# Patient Record
Sex: Female | Born: 1960 | Race: Black or African American | Hispanic: No | Smoking: Never smoker
Health system: Southern US, Community
[De-identification: ages and names within clinical notes are randomized; demographics above are authoritative.]

## PROBLEM LIST (undated history)

## (undated) DIAGNOSIS — N952 Postmenopausal atrophic vaginitis: Secondary | ICD-10-CM

## (undated) DIAGNOSIS — F419 Anxiety disorder, unspecified: Secondary | ICD-10-CM

## (undated) DIAGNOSIS — E785 Hyperlipidemia, unspecified: Secondary | ICD-10-CM

## (undated) HISTORY — DX: Postmenopausal atrophic vaginitis: N95.2

## (undated) HISTORY — DX: Hyperlipidemia, unspecified: E78.5

## (undated) HISTORY — DX: Anxiety disorder, unspecified: F41.9

---

## 2008-04-20 LAB — HM COLONOSCOPY: HM COLON: NORMAL

## 2009-04-21 ENCOUNTER — Emergency Department: Payer: Self-pay | Admitting: Emergency Medicine

## 2009-05-01 ENCOUNTER — Emergency Department: Payer: Self-pay | Admitting: Emergency Medicine

## 2013-07-17 ENCOUNTER — Ambulatory Visit: Payer: Self-pay | Admitting: Urology

## 2014-07-16 DIAGNOSIS — N952 Postmenopausal atrophic vaginitis: Secondary | ICD-10-CM | POA: Insufficient documentation

## 2014-07-16 DIAGNOSIS — E785 Hyperlipidemia, unspecified: Secondary | ICD-10-CM | POA: Insufficient documentation

## 2014-07-16 DIAGNOSIS — F419 Anxiety disorder, unspecified: Secondary | ICD-10-CM | POA: Insufficient documentation

## 2014-07-22 ENCOUNTER — Encounter: Payer: Self-pay | Admitting: Unknown Physician Specialty

## 2014-07-22 ENCOUNTER — Ambulatory Visit (INDEPENDENT_AMBULATORY_CARE_PROVIDER_SITE_OTHER): Payer: Commercial Managed Care - PPO | Admitting: Unknown Physician Specialty

## 2014-07-22 ENCOUNTER — Telehealth: Payer: Self-pay | Admitting: Unknown Physician Specialty

## 2014-07-22 VITALS — BP 149/80 | HR 66 | Temp 98.5°F | Ht 68.9 in | Wt 164.2 lb

## 2014-07-22 DIAGNOSIS — Z0000 Encounter for general adult medical examination without abnormal findings: Secondary | ICD-10-CM

## 2014-07-22 NOTE — Telephone Encounter (Signed)
Pt scheduled for 10/23/14 @ 4pm. Thanks.

## 2014-07-22 NOTE — Telephone Encounter (Signed)
-----   Message from Kathrine Haddock, NP sent at 07/22/2014  9:52 AM EDT ----- Regarding: appt Please make an appt in 3 months to f/u BP

## 2014-07-22 NOTE — Progress Notes (Signed)
BP 149/80 mmHg  Pulse 66  Temp(Src) 98.5 F (36.9 C)  Ht 5' 8.9" (1.75 m)  Wt 164 lb 3.2 oz (74.481 kg)  BMI 24.32 kg/m2  SpO2 99%  LMP  (LMP Unknown)   Subjective:    Patient ID: Suzanne Scott, female    DOB: 1960/02/11, 54 y.o.   MRN: [id number]  HPI: Suzanne Scott is a 54 y.o. female  Chief Complaint  Patient presents with  . Annual Exam    Relevant past medical, surgical, family and social history reviewed and updated as indicated. Interim medical history since our last visit reviewed. Allergies and medications reviewed and updated.  Review of Systems  Constitutional: Negative.        Decreased appetite but no weight change  HENT: Negative.   Eyes: Negative.   Respiratory: Negative.   Cardiovascular: Negative.   Gastrointestinal: Negative.   Endocrine: Negative.   Genitourinary: Negative.   Musculoskeletal: Negative.        Some foot pai at top of foot when she first gets out of bed  Skin: Negative.   Allergic/Immunologic: Negative.   Neurological: Negative.   Hematological: Negative.   Psychiatric/Behavioral: Negative.     Per HPI unless specifically indicated above     Objective:    BP 149/80 mmHg  Pulse 66  Temp(Src) 98.5 F (36.9 C)  Ht 5' 8.9" (1.75 m)  Wt 164 lb 3.2 oz (74.481 kg)  BMI 24.32 kg/m2  SpO2 99%  LMP  (LMP Unknown)  Wt Readings from Last 3 Encounters:  07/22/14 164 lb 3.2 oz (74.481 kg)  05/01/14 168 lb (76.204 kg)    Physical Exam  Constitutional: She is oriented to person, place, and time. She appears well-developed and well-nourished.  HENT:  Head: Normocephalic and atraumatic.  Eyes: Pupils are equal, round, and reactive to light. Right eye exhibits no discharge. Left eye exhibits no discharge. No scleral icterus.  Neck: Normal range of motion. Neck supple. Carotid bruit is not present. No thyromegaly present.  Cardiovascular: Normal rate, regular rhythm and normal heart sounds.  Exam reveals no gallop and no friction  rub.   No murmur heard. Pulmonary/Chest: Effort normal and breath sounds normal. No respiratory distress. She has no wheezes. She has no rales.  Abdominal: Soft. Bowel sounds are normal. There is no tenderness. There is no rebound.  Genitourinary: Vagina normal and uterus normal. No breast swelling, tenderness, discharge or bleeding. Pelvic exam was performed with patient prone. There is no rash, tenderness, lesion or injury on the right labia. There is no rash, tenderness, lesion or injury on the left labia. Cervix exhibits no motion tenderness, no discharge and no friability. Right adnexum displays no mass, no tenderness and no fullness. Left adnexum displays no mass, no tenderness and no fullness.  Musculoskeletal: Normal range of motion.  Lymphadenopathy:    She has no cervical adenopathy.  Neurological: She is alert and oriented to person, place, and time.  Skin: Skin is warm, dry and intact. No rash noted.  Psychiatric: She has a normal mood and affect. Her speech is normal and behavior is normal. Judgment and thought content normal. Cognition and memory are normal.    Results for orders placed or performed in visit on 07/22/14  HM COLONOSCOPY  Result Value Ref Range   HM Colonoscopy normal       Assessment & Plan:   Problem List Items Addressed This Visit    None    Visit Diagnoses  Annual physical exam    -  Primary    Relevant Orders    CBC    Comprehensive metabolic panel    HIV antibody    Lipid Panel w/o Chol/HDL Ratio    Pap liquid-based and HPV (high risk)    TSH    Vit D  25 hydroxy (rtn osteoporosis monitoring)    MM DIGITAL SCREENING BILATERAL        Follow up plan: Return as needed.  I would like to see her in 3 months to evaluate BP. She will start monitoring at work.

## 2014-07-23 LAB — CBC
Hematocrit: 39.9 % (ref 34.0–46.6)
Hemoglobin: 12.9 g/dL (ref 11.1–15.9)
MCH: 26.8 pg (ref 26.6–33.0)
MCHC: 32.3 g/dL (ref 31.5–35.7)
MCV: 83 fL (ref 79–97)
PLATELETS: 255 10*3/uL (ref 150–379)
RBC: 4.81 x10E6/uL (ref 3.77–5.28)
RDW: 15.4 % (ref 12.3–15.4)
WBC: 4.3 10*3/uL (ref 3.4–10.8)

## 2014-07-23 LAB — COMPREHENSIVE METABOLIC PANEL
A/G RATIO: 1.8 (ref 1.1–2.5)
ALT: 17 IU/L (ref 0–32)
AST: 20 IU/L (ref 0–40)
Albumin: 4.2 g/dL (ref 3.5–5.5)
Alkaline Phosphatase: 67 IU/L (ref 39–117)
BUN/Creatinine Ratio: 14 (ref 9–23)
BUN: 11 mg/dL (ref 6–24)
Bilirubin Total: 0.5 mg/dL (ref 0.0–1.2)
CALCIUM: 9 mg/dL (ref 8.7–10.2)
CO2: 25 mmol/L (ref 18–29)
CREATININE: 0.8 mg/dL (ref 0.57–1.00)
Chloride: 100 mmol/L (ref 97–108)
GFR calc Af Amer: 96 mL/min/{1.73_m2} (ref >59)
GFR, EST NON AFRICAN AMERICAN: 83 mL/min/{1.73_m2} (ref >59)
GLUCOSE: 64 mg/dL — AB (ref 65–99)
Globulin, Total: 2.4 g/dL (ref 1.5–4.5)
Potassium: 4 mmol/L (ref 3.5–5.2)
SODIUM: 139 mmol/L (ref 134–144)
Total Protein: 6.6 g/dL (ref 6.0–8.5)

## 2014-07-23 LAB — LIPID PANEL W/O CHOL/HDL RATIO
CHOLESTEROL TOTAL: 226 mg/dL — AB (ref 100–199)
HDL: 46 mg/dL (ref >39)
LDL Calculated: 162 mg/dL — ABNORMAL HIGH (ref 0–99)
TRIGLYCERIDES: 90 mg/dL (ref 0–149)
VLDL Cholesterol Cal: 18 mg/dL (ref 5–40)

## 2014-07-23 LAB — HIV ANTIBODY (ROUTINE TESTING W REFLEX): HIV Screen 4th Generation wRfx: NONREACTIVE

## 2014-07-23 LAB — TSH: TSH: 0.535 u[IU]/mL (ref 0.450–4.500)

## 2014-07-23 LAB — VITAMIN D 25 HYDROXY (VIT D DEFICIENCY, FRACTURES): VIT D 25 HYDROXY: 32.4 ng/mL (ref 30.0–100.0)

## 2014-07-24 LAB — PAP LB AND HPV HIGH-RISK: PAP SMEAR COMMENT: 0

## 2014-07-27 NOTE — Progress Notes (Signed)
Quick Note:  Call tell pap normal ______

## 2014-10-23 ENCOUNTER — Ambulatory Visit: Payer: Commercial Managed Care - PPO | Admitting: Unknown Physician Specialty

## 2014-10-26 ENCOUNTER — Ambulatory Visit: Payer: Commercial Managed Care - PPO | Admitting: Unknown Physician Specialty

## 2014-10-28 ENCOUNTER — Ambulatory Visit: Payer: Commercial Managed Care - PPO | Admitting: Unknown Physician Specialty

## 2014-11-30 ENCOUNTER — Ambulatory Visit (INDEPENDENT_AMBULATORY_CARE_PROVIDER_SITE_OTHER): Payer: Commercial Managed Care - PPO | Admitting: Unknown Physician Specialty

## 2014-11-30 ENCOUNTER — Encounter: Payer: Self-pay | Admitting: Unknown Physician Specialty

## 2014-11-30 VITALS — BP 122/74 | HR 70 | Temp 98.5°F | Ht 67.7 in | Wt 160.0 lb

## 2014-11-30 DIAGNOSIS — I10 Essential (primary) hypertension: Secondary | ICD-10-CM | POA: Diagnosis not present

## 2014-11-30 DIAGNOSIS — E785 Hyperlipidemia, unspecified: Secondary | ICD-10-CM | POA: Diagnosis not present

## 2014-11-30 DIAGNOSIS — J309 Allergic rhinitis, unspecified: Secondary | ICD-10-CM

## 2014-11-30 LAB — LIPID PANEL PICCOLO, WAIVED
CHOL/HDL RATIO PICCOLO,WAIVE: 4.4 mg/dL
CHOLESTEROL PICCOLO, WAIVED: 228 mg/dL — AB (ref <200)
HDL CHOL PICCOLO, WAIVED: 52 mg/dL — AB (ref >59)
LDL Chol Calc Piccolo Waived: 152 mg/dL — ABNORMAL HIGH (ref <100)
TRIGLYCERIDES PICCOLO,WAIVED: 122 mg/dL (ref <150)
VLDL Chol Calc Piccolo,Waive: 24 mg/dL (ref <30)

## 2014-11-30 NOTE — Assessment & Plan Note (Signed)
Supportive care. 

## 2014-11-30 NOTE — Progress Notes (Signed)
BP 122/74 mmHg  Pulse 70  Temp(Src) 98.5 F (36.9 C)  Ht 5' 7.7" (1.72 m)  Wt 160 lb (72.576 kg)  BMI 24.53 kg/m2  SpO2 98%  LMP  (LMP Unknown)   Subjective:    Patient ID: Suzanne Scott, female    DOB: December 29, 1960, 54 y.o.   MRN: [id number]  HPI: Suzanne Scott is a 54 y.o. female  Chief Complaint  Patient presents with  . Hypertension   Hypertension:  Using medications without difficulty Average home BPs good   Using medication without problems or lightheadedness No chest pain with exertion or shortness of breath Edema: none Other issues: Woke up one morning with light headedness and spinning.  OK after lunch   Elevated Cholesterol: States she is "trying" to bring it down and has lost weight.   Not on meds   Relevant past medical, surgical, family and social history reviewed and updated as indicated. Interim medical history since our last visit reviewed. Allergies and medications reviewed and updated.  Review of Systems  Constitutional:       She lost some weight but admits to a poor appetite  HENT:       States she has some sinus drainage in the AM and is hoarse for a period of time    Per HPI unless specifically indicated above     Objective:    BP 122/74 mmHg  Pulse 70  Temp(Src) 98.5 F (36.9 C)  Ht 5' 7.7" (1.72 m)  Wt 160 lb (72.576 kg)  BMI 24.53 kg/m2  SpO2 98%  LMP  (LMP Unknown)  Wt Readings from Last 3 Encounters:  11/30/14 160 lb (72.576 kg)  07/22/14 164 lb 3.2 oz (74.481 kg)  05/01/14 168 lb (76.204 kg)    Physical Exam  Constitutional: She is oriented to person, place, and time. She appears well-developed and well-nourished. No distress.  HENT:  Head: Normocephalic and atraumatic.  Eyes: Conjunctivae and lids are normal. Right eye exhibits no discharge. Left eye exhibits no discharge. No scleral icterus.  Cardiovascular: Normal rate, regular rhythm and normal heart sounds.   Pulmonary/Chest: Effort normal and breath sounds  normal. No respiratory distress.  Abdominal: Normal appearance. There is no splenomegaly or hepatomegaly.  Musculoskeletal: Normal range of motion.  Neurological: She is alert and oriented to person, place, and time.  Skin: Skin is intact. No rash noted. No pallor.  Psychiatric: She has a normal mood and affect. Her behavior is normal. Judgment and thought content normal.    Results for orders placed or performed in visit on 07/22/14  CBC  Result Value Ref Range   WBC 4.3 3.4 - 10.8 x10E3/uL   RBC 4.81 3.77 - 5.28 x10E6/uL   Hemoglobin 12.9 11.1 - 15.9 g/dL   Hematocrit 39.9 34.0 - 46.6 %   MCV 83 79 - 97 fL   MCH 26.8 26.6 - 33.0 pg   MCHC 32.3 31.5 - 35.7 g/dL   RDW 15.4 12.3 - 15.4 %   Platelets 255 150 - 379 x10E3/uL  Comprehensive metabolic panel  Result Value Ref Range   Glucose 64 (L) 65 - 99 mg/dL   BUN 11 6 - 24 mg/dL   Creatinine, Ser 0.80 0.57 - 1.00 mg/dL   GFR calc non Af Amer 83 >59 mL/min/1.73   GFR calc Af Amer 96 >59 mL/min/1.73   BUN/Creatinine Ratio 14 9 - 23   Sodium 139 134 - 144 mmol/L   Potassium 4.0 3.5 - 5.2  mmol/L   Chloride 100 97 - 108 mmol/L   CO2 25 18 - 29 mmol/L   Calcium 9.0 8.7 - 10.2 mg/dL   Total Protein 6.6 6.0 - 8.5 g/dL   Albumin 4.2 3.5 - 5.5 g/dL   Globulin, Total 2.4 1.5 - 4.5 g/dL   Albumin/Globulin Ratio 1.8 1.1 - 2.5   Bilirubin Total 0.5 0.0 - 1.2 mg/dL   Alkaline Phosphatase 67 39 - 117 IU/L   AST 20 0 - 40 IU/L   ALT 17 0 - 32 IU/L  HIV antibody  Result Value Ref Range   HIV Screen 4th Generation wRfx Non Reactive Non Reactive  Lipid Panel w/o Chol/HDL Ratio  Result Value Ref Range   Cholesterol, Total 226 (H) 100 - 199 mg/dL   Triglycerides 90 0 - 149 mg/dL   HDL 46 >39 mg/dL   VLDL Cholesterol Cal 18 5 - 40 mg/dL   LDL Calculated 162 (H) 0 - 99 mg/dL  TSH  Result Value Ref Range   TSH 0.535 0.450 - 4.500 uIU/mL  Vit D  25 hydroxy (rtn osteoporosis monitoring)  Result Value Ref Range   Vit D, 25-Hydroxy 32.4 30.0  - 100.0 ng/mL  HM COLONOSCOPY  Result Value Ref Range   HM Colonoscopy normal   Pap liquid-based and HPV (high risk)  Result Value Ref Range   DIAGNOSIS: Comment    Specimen adequacy: Comment    CLINICIAN PROVIDED ICD10: Comment    Performed by: Comment    QC reviewed by: Comment    PAP SMEAR COMMENT .    Note: Comment       Assessment & Plan:   Problem List Items Addressed This Visit      Unprioritized   Hyperlipidemia    Reviewed.  LDL is 152, elevated but not in statin benefit group as she is normotensive today off BP medications.        Relevant Orders   Lipid Panel [name], Waived   Allergic rhinitis    Supportive care      RESOLVED: Hypertension - Primary    Resolved today          Follow up plan: Return for April/May for PE.

## 2014-11-30 NOTE — Assessment & Plan Note (Signed)
Reviewed.  LDL is 152, elevated but not in statin benefit group as she is normotensive today off BP medications.

## 2014-11-30 NOTE — Patient Instructions (Signed)
Cholesterol Cholesterol is a white, waxy, fat-like substance needed by your body in small amounts. The liver makes all the cholesterol you need. Cholesterol is carried from the liver by the blood through the blood vessels. Deposits of cholesterol (plaque) may build up on blood vessel walls. These make the arteries narrower and stiffer. Cholesterol plaques increase the risk for heart attack and stroke.  You cannot feel your cholesterol level even if it is very high. The only way to know it is high is with a blood test. Once you know your cholesterol levels, you should keep a record of the test results. Work with your health care provider to keep your levels in the desired range.  WHAT DO THE RESULTS MEAN?  Total cholesterol is a rough measure of all the cholesterol in your blood.   LDL is the so-called bad cholesterol. This is the type that deposits cholesterol in the walls of the arteries. You want this level to be low.   HDL is the good cholesterol because it cleans the arteries and carries the LDL away. You want this level to be high.  Triglycerides are fat that the body can either burn for energy or store. High levels are closely linked to heart disease.  WHAT ARE THE DESIRED LEVELS OF CHOLESTEROL?  Total cholesterol below 200.   LDL below 100 for people at risk, below 70 for those at very high risk.   HDL above 50 is good, above 60 is best.   Triglycerides below 150.  HOW CAN I LOWER MY CHOLESTEROL?  Diet. Follow your diet programs as directed by your health care provider.   Choose fish or white meat chicken and [place], roasted or baked. Limit fatty cuts of red meat, fried foods, and processed meats, such as sausage and lunch meats.   Eat lots of fresh fruits and vegetables.  Choose whole grains, beans, pasta, potatoes, and cereals.   Use only small amounts of olive, corn, or canola oils.   Avoid butter, mayonnaise, shortening, or palm kernel oils.  Avoid foods with  trans fats.   Drink skim or nonfat milk and eat low-fat or nonfat yogurt and cheeses. Avoid whole milk, cream, ice cream, egg yolks, and full-fat cheeses.   Healthy desserts include angel food cake, ginger snaps, animal crackers, hard candy, popsicles, and low-fat or nonfat frozen yogurt. Avoid pastries, cakes, pies, and cookies.   Exercise. Follow your exercise programs as directed by your health care provider.   A regular program helps decrease LDL and raise HDL.   A regular program helps with weight control.   Do things that increase your activity level like gardening, walking, or taking the stairs. Ask your health care provider about how you can be more active in your daily life.   Medicine. Take medicine only as directed by your health care provider.   Medicine may be prescribed by your health care provider to help lower cholesterol and decrease the risk for heart disease.   If you have several risk factors, you may need medicine even if your levels are normal.   This information is not intended to replace advice given to you by your health care provider. Make sure you discuss any questions you have with your health care provider.   Document Released: 10/11/2000 Document Revised: 02/06/2014 Document Reviewed: 10/30/2012 Elsevier Interactive Patient Education 2016 [employer] Fat and Cholesterol Restricted Diet Getting too much fat and cholesterol in your diet may cause health problems. Following this diet helps keep  your fat and cholesterol at normal levels. This can keep you from getting sick. WHAT TYPES OF FAT SHOULD I CHOOSE?  Choose monosaturated and polyunsaturated fats. These are found in foods such as olive oil, canola oil, flaxseeds, walnuts, almonds, and seeds.  Eat more omega-3 fats. Good choices include salmon, mackerel, sardines, tuna, flaxseed oil, and ground flaxseeds.  Limit saturated fats. These are in animal products such as meats, butter, and cream.  They can also be in plant products such as palm oil, palm kernel oil, and coconut oil.   Avoid foods with partially hydrogenated oils in them. These contain trans fats. Examples of foods that have trans fats are stick margarine, some tub margarines, cookies, crackers, and other baked goods. WHAT GENERAL GUIDELINES DO I NEED TO FOLLOW?   Check food labels. Look for the words "trans fat" and "saturated fat."  When preparing a meal:  Fill half of your plate with vegetables and green salads.  Fill one fourth of your plate with whole grains. Look for the word "whole" as the first word in the ingredient list.  Fill one fourth of your plate with lean protein foods.  Limit fruit to two servings a day. Choose fruit instead of juice.  Eat more foods with soluble fiber. Examples of foods with this type of fiber are apples, broccoli, carrots, beans, peas, and barley. Try to get 20-30 g (grams) of fiber per day.  Eat more home-cooked foods. Eat less at restaurants and buffets.  Limit or avoid alcohol.  Limit foods high in starch and sugar.  Limit fried foods.  Cook foods without frying them. Baking, boiling, grilling, and broiling are all great options.  Lose weight if you are overweight. Losing even a small amount of weight can help your overall health. It can also help prevent diseases such as diabetes and heart disease. WHAT FOODS CAN I EAT? Grains Whole grains, such as whole wheat or whole grain breads, crackers, cereals, and pasta. Unsweetened oatmeal, bulgur, barley, quinoa, or brown rice. Corn or whole wheat flour tortillas. Vegetables Fresh or frozen vegetables (raw, steamed, roasted, or grilled). Green salads. Fruits All fresh, canned (in natural juice), or frozen fruits. Meat and Other Protein Products Ground beef (85% or leaner), grass-fed beef, or beef trimmed of fat. Skinless chicken or [place]. Ground chicken or [place]. Pork trimmed of fat. All fish and seafood. Eggs. Dried  beans, peas, or lentils. Unsalted nuts or seeds. Unsalted canned or dry beans. Dairy Low-fat dairy products, such as skim or 1% milk, 2% or reduced-fat cheeses, low-fat ricotta or cottage cheese, or plain low-fat yogurt. Fats and Oils Tub margarines without trans fats. Light or reduced-fat mayonnaise and salad dressings. Avocado. Olive, canola, sesame, or safflower oils. Natural peanut or almond butter (choose ones without added sugar and oil). The items listed above may not be a complete list of recommended foods or beverages. Contact your dietitian for more options. WHAT FOODS ARE NOT RECOMMENDED? Grains White bread. White pasta. White rice. Cornbread. Bagels, pastries, and croissants. Crackers that contain trans fat. Vegetables White potatoes. Corn. Creamed or fried vegetables. Vegetables in a cheese sauce. Fruits Dried fruits. Canned fruit in light or heavy syrup. Fruit juice. Meat and Other Protein Products Fatty cuts of meat. Ribs, chicken wings, bacon, sausage, bologna, salami, chitterlings, fatback, hot dogs, bratwurst, and packaged luncheon meats. Liver and organ meats. Dairy Whole or 2% milk, cream, half-and-half, and cream cheese. Whole milk cheeses. Whole-fat or sweetened yogurt. Full-fat cheeses. Nondairy creamers and whipped  toppings. Processed cheese, cheese spreads, or cheese curds. Sweets and Desserts Corn syrup, sugars, honey, and molasses. Candy. Jam and jelly. Syrup. Sweetened cereals. Cookies, pies, cakes, donuts, muffins, and ice cream. Fats and Oils Butter, stick margarine, lard, shortening, ghee, or bacon fat. Coconut, palm kernel, or palm oils. Beverages Alcohol. Sweetened drinks (such as sodas, lemonade, and fruit drinks or punches). The items listed above may not be a complete list of foods and beverages to avoid. Contact your dietitian for more information.   This information is not intended to replace advice given to you by your health care provider. Make sure  you discuss any questions you have with your health care provider.   Document Released: 07/18/2011 Document Revised: 02/06/2014 Document Reviewed: 04/17/2013 Elsevier Interactive Patient Education [employer].

## 2014-11-30 NOTE — Assessment & Plan Note (Signed)
Resolved today. 

## 2015-03-18 ENCOUNTER — Inpatient Hospital Stay
Admission: RE | Admit: 2015-03-18 | Discharge: 2015-03-18 | Disposition: A | Discharge status: Home / Self Care | Payer: Self-pay | Source: Ambulatory Visit | Attending: *Deleted | Admitting: *Deleted

## 2015-03-18 ENCOUNTER — Other Ambulatory Visit: Payer: Self-pay | Admitting: Obstetrics and Gynecology

## 2015-03-18 ENCOUNTER — Other Ambulatory Visit: Payer: Self-pay | Admitting: *Deleted

## 2015-03-18 DIAGNOSIS — Z9289 Personal history of other medical treatment: Secondary | ICD-10-CM

## 2015-03-18 DIAGNOSIS — R928 Other abnormal and inconclusive findings on diagnostic imaging of breast: Secondary | ICD-10-CM

## 2015-03-30 ENCOUNTER — Ambulatory Visit
Admission: RE | Admit: 2015-03-30 | Discharge: 2015-03-30 | Disposition: A | Discharge status: Home / Self Care | Payer: Commercial Managed Care - PPO | Source: Ambulatory Visit | Attending: Obstetrics and Gynecology | Admitting: Obstetrics and Gynecology

## 2015-03-30 DIAGNOSIS — N63 Unspecified lump in breast: Secondary | ICD-10-CM | POA: Insufficient documentation

## 2015-03-30 DIAGNOSIS — R928 Other abnormal and inconclusive findings on diagnostic imaging of breast: Secondary | ICD-10-CM

## 2015-04-01 ENCOUNTER — Other Ambulatory Visit: Payer: Self-pay | Admitting: Obstetrics and Gynecology

## 2015-04-01 DIAGNOSIS — N63 Unspecified lump in unspecified breast: Secondary | ICD-10-CM

## 2015-04-08 ENCOUNTER — Ambulatory Visit
Admission: RE | Admit: 2015-04-08 | Discharge: 2015-04-08 | Disposition: A | Discharge status: Home / Self Care | Payer: Commercial Managed Care - PPO | Source: Ambulatory Visit | Attending: Obstetrics and Gynecology | Admitting: Obstetrics and Gynecology

## 2015-04-08 DIAGNOSIS — N63 Unspecified lump in unspecified breast: Secondary | ICD-10-CM

## 2015-04-08 HISTORY — PX: BREAST BIOPSY: SHX20

## 2015-04-12 LAB — SURGICAL PATHOLOGY

## 2015-07-23 ENCOUNTER — Ambulatory Visit (INDEPENDENT_AMBULATORY_CARE_PROVIDER_SITE_OTHER): Payer: Commercial Managed Care - PPO | Admitting: Unknown Physician Specialty

## 2015-07-23 ENCOUNTER — Encounter: Payer: Self-pay | Admitting: Unknown Physician Specialty

## 2015-07-23 VITALS — BP 104/65 | HR 75 | Temp 98.1°F | Ht 67.6 in | Wt 161.2 lb

## 2015-07-23 DIAGNOSIS — M7710 Lateral epicondylitis, unspecified elbow: Secondary | ICD-10-CM | POA: Insufficient documentation

## 2015-07-23 DIAGNOSIS — E785 Hyperlipidemia, unspecified: Secondary | ICD-10-CM | POA: Diagnosis not present

## 2015-07-23 DIAGNOSIS — H6122 Impacted cerumen, left ear: Secondary | ICD-10-CM | POA: Diagnosis not present

## 2015-07-23 DIAGNOSIS — M7711 Lateral epicondylitis, right elbow: Secondary | ICD-10-CM | POA: Diagnosis not present

## 2015-07-23 DIAGNOSIS — Z0000 Encounter for general adult medical examination without abnormal findings: Secondary | ICD-10-CM | POA: Diagnosis not present

## 2015-07-23 NOTE — Assessment & Plan Note (Signed)
Pain with twisting.  Thinks it is work related

## 2015-07-23 NOTE — Patient Instructions (Signed)
Generic Elbow Exercises EXERCISES RANGE OF MOTION (ROM) AND STRETCHING EXERCISES  These exercises may help you when beginning to rehabilitate your injury. Your symptoms may go away with or without further involvement from your physician, physical therapist or athletic trainer. While completing these exercises, remember:   Restoring tissue flexibility helps normal motion to return to the joints. This allows healthier, less painful movement and activity.  An effective stretch should be held for at least 30 seconds.  A stretch should never be painful. You should only feel a gentle lengthening or release in the stretched tissue. RANGE OF MOTION - Extension  Hold your right / left arm at your side and straighten your elbow as far as you can, using your right / left arm muscles.  Straighten the elbow farther by gently pushing down on your forearm until you feel a gentle stretch on the inside of your elbow. Hold this position for __________ seconds.  Slowly return to the starting position. Repeat __________ times. Complete this exercise __________ times per day.  RANGE OF MOTION - Flexion  Hold your right / left arm at your side and bend your elbow as far as you can using your arm muscles.  Bend the right / left elbow farther by gently pushing up on your forearm until you feel a gentle stretch on the outside of your elbow. Hold this position for __________ seconds.  Slowly return to the starting position. Repeat __________ times. Complete this exercise __________ times per day.  RANGE OF MOTION - Supination, Active  Stand or sit with your elbows at your side. Bend your right / left elbow to 90 degrees.  Turn your palm upward until you feel a gentle stretch on the inside of your forearm.  Hold this position for __________ seconds. Slowly release and return to the starting position. Repeat __________ times. Complete this stretch __________ times per day.  RANGE OF MOTION - Pronation,  Active  Stand or sit with your elbows at your side. Bend your right / left elbow to 90 degrees.  Turn your palm downward until you feel a gentle stretch on the top of your forearm.  Hold this position for __________ seconds. Slowly release and return to the starting position. Repeat __________ times. Complete this stretch __________ times per day.  STRETCH - Elbow Flexors  Lie on a firm bed or countertop, on your back. Be sure that you are in a comfortable position which will allow you to relax your arm muscles.  Place a folded towel under your right / left upper arm, so that your elbow and shoulder are at the same height. Extend your arm; your elbow should not rest on the bed or towel  Allow the weight of your hand to straighten your elbow. Keep your arm and chest muscles relaxed. Your caregiver may ask you to increase the intensity of your stretch by adding a small wrist or hand weight.  Hold for __________ seconds. You should feel a stretch on the inside of your elbow. Slowly return to the starting position. Repeat __________ times. Complete this exercise __________ times per day. STRENGTHENING EXERCISES  These exercises may help you when beginning to rehabilitate your injury. They may resolve your symptoms with or without further involvement from your physician, physical therapist or athletic trainer. While completing these exercises, remember:   Muscles can gain both the endurance and the strength needed for everyday activities through controlled exercises.  Complete these exercises as instructed by your physician, physical therapist or  Product/process development scientist. Increase the resistance and repetitions only as guided.  You may experience muscle soreness or fatigue, but the pain or discomfort you are trying to eliminate should never worsen during these exercises. If this pain does get worse, stop and make sure you are following the directions exactly. If the pain is still present after  adjustments, discontinue the exercise until you can discuss the trouble with your caregiver. STRENGTH - Elbow Flexors, Isometric   Stand or sit upright on a firm surface. Place your right / left arm so that your hand is palm-up and at the height of your waist.  Place your opposite hand on top of your forearm. Gently push down as your right / left arm resists. Push as hard as you can with both arms without causing any pain or movement at your right / left elbow. Hold this stationary position for __________ seconds.  Gradually release the tension in both arms. Allow your muscles to relax completely before repeating. Repeat __________ times. Complete this exercise __________ times per day. STRENGTH - Elbow Extensors, Isometric  Stand or sit upright on a firm surface. Place your right / left arm so that your palm faces your abdomen and it is at the height of your waist.  Place your opposite hand on the underside of your forearm. Gently push up as your right / left arm resists. Push as hard as you can with both arms without causing any pain or movement at your right / left elbow. Hold this stationary position for __________ seconds.  Gradually release the tension in both arms. Allow your muscles to relax completely before repeating. Repeat __________ times. Complete this exercise __________ times per day. STRENGTH - Elbow Flexors, Supinated  With good posture, stand, or sit on a firm chair without armrests. Allow your right / left arm to rest at your side with your palm facing forward.  Holding a __________ weight, or gripping a rubber exercise band or tubing, bring your hand toward your shoulder.  Allow your muscles to control the resistance, as your hand returns to your side. Repeat __________ times. Complete this exercise __________ times per day.  STRENGTH - Elbow Extensors, Dynamic  With good posture, stand, or sit on a firm chair without armrests. Keeping your upper arms at your side,  bring both hands up toward your right / left shoulder while gripping a rubber exercise band or tubing. Your right / left hand should be just below the other hand.  Straighten your right / left elbow. Hold for __________ seconds.  Allow your muscles to control the rubber exercise band, as your hand returns to your shoulder. Repeat __________ times. Complete this exercise __________ times per day. STRENGTH - Forearm Supinators   Sit with your right / left forearm supported on a table, keeping your elbow below shoulder height. Rest your hand over the edge, palm down.  Gently grip a hammer or a soup ladle.  Without moving your elbow, slowly turn your palm and hand upward to a "thumbs-up" position.  Hold this position for __________ seconds. Slowly return to the starting position. Repeat __________ times. Complete this exercise __________ times per day.  STRENGTH - Forearm Pronators  Sit with your right / left forearm supported on a table, keeping your elbow below shoulder height. Rest your hand over the edge, palm up.  Gently grip a hammer or a soup ladle.  Without moving your elbow, slowly turn your palm and hand upward to a "thumbs-up" position.  Hold  this position for __________ seconds. Slowly return to the starting position. Repeat __________ times. Complete this exercise __________ times per day.   This information is not intended to replace advice given to you by your health care provider. Make sure you discuss any questions you have with your health care provider.   Document Released: 11/30/2004 Document Revised: 02/06/2014 Document Reviewed: 04/30/2008 Elsevier Interactive Patient Education 2016 [employer]. Tennis Elbow Tennis elbow (lateral epicondylitis) is inflammation of the outer tendons of your forearm close to your elbow. Your tendons attach your muscles to your bones. The outer tendons of your forearm are used to extend your wrist, and they attach on the outside part  of your elbow. Tennis elbow is often found in people who play tennis, but anyone may get the condition from repeatedly extending the wrist or turning the forearm. CAUSES This condition is caused by repeatedly extending your wrist and using your hands. It can result from sports or work that requires repetitive forearm movements. Tennis elbow may also be caused by an injury. RISK FACTORS You have a higher risk of developing tennis elbow if you play tennis or another racquet sport. You also have a higher risk if you frequently use your hands for work. This condition is also more likely to develop in:  Musicians.  Carpenters, painters, and plumbers.  Cooks.  Cashiers.  People who work in [employer].  Architect workers.  Butchers.  People who use computers. SYMPTOMS Symptoms of this condition include:  Pain and tenderness in your forearm and the outer part of your elbow. You may only feel the pain when you use your arm, or you may feel it even when you are not using your arm.  A burning feeling that runs from your elbow through your arm.  Weak grip in your hands. DIAGNOSIS  This condition may be diagnosed by medical history and physical exam. You may also have other tests, including:  X-rays.  MRI. TREATMENT Your health care provider will recommend lifestyle adjustments, such as resting and icing your arm. Treatment may also include:  Medicines for inflammation. This may include shots of cortisone if your pain continues.  Physical therapy. This may include massage or exercises.  An elbow brace. Surgery may eventually be recommended if your pain does not go away with treatment. HOME CARE INSTRUCTIONS Activity  Rest your elbow and wrist as directed by your health care provider. Try to avoid any activities that caused the problem until your health care provider says that you can do them again.  If a physical therapist teaches you exercises, do all of them as directed.  If  you lift an object, lift it with your palm facing upward. This lowers the stress on your elbow. Lifestyle  If your tennis elbow is caused by sports, check your equipment and make sure that:  You are using it correctly.  It is the best fit for you.  If your tennis elbow is caused by work, take breaks frequently, if you are able. Talk with your manager about how to best perform tasks in a way that is safe.  If your tennis elbow is caused by computer use, talk with your manager about any changes that can be made to your work environment. General Instructions  If directed, apply ice to the painful area:  Put ice in a plastic bag.  Place a towel between your skin and the bag.  Leave the ice on for 20 minutes, 2-3 times per day.  Take medicines  only as directed by your health care provider.  If you were given a brace, wear it as directed by your health care provider.  Keep all follow-up visits as directed by your health care provider. This is important. SEEK MEDICAL CARE IF:  Your pain does not get better with treatment.  Your pain gets worse.  You have numbness or weakness in your forearm, hand, or fingers.   This information is not intended to replace advice given to you by your health care provider. Make sure you discuss any questions you have with your health care provider.   Document Released: 01/16/2005 Document Revised: 06/02/2014 Document Reviewed: 01/12/2014 Elsevier Interactive Patient Education 2016 [employer] Lateral Epicondylitis With Rehab Lateral epicondylitis involves inflammation and pain around the outer portion of the elbow. The pain is caused by inflammation of the tendons in the forearm that bring back (extend) the wrist. Lateral epicondylitis is also called tennis elbow, because it is very common in tennis players. However, it may occur in any individual who extends the wrist repetitively. If lateral epicondylitis is left untreated, it may become a chronic  problem. SYMPTOMS   Pain, tenderness, and inflammation on the outer (lateral) side of the elbow.  Pain or weakness with gripping activities.  Pain that increases with wrist-twisting motions (playing tennis, using a screwdriver, opening a door or a jar).  Pain with lifting objects, including a coffee cup. CAUSES  Lateral epicondylitis is caused by inflammation of the tendons that extend the wrist. Causes of injury may include:  Repetitive stress and strain on the muscles and tendons that extend the wrist.  Sudden change in activity level or intensity.  Incorrect grip in racquet sports.  Incorrect grip size of racquet (often too large).  Incorrect hitting position or technique (usually backhand, leading with the elbow).  Using a racket that is too heavy. RISK INCREASES WITH:  Sports or occupations that require repetitive and/or strenuous forearm and wrist movements (tennis, squash, racquetball, carpentry).  Poor wrist and forearm strength and flexibility.  Failure to warm up properly before activity.  Resuming activity before healing, rehabilitation, and conditioning are complete. PREVENTION   Warm up and stretch properly before activity.  Maintain physical fitness:  Strength, flexibility, and endurance.  Cardiovascular fitness.  Wear and use properly fitted equipment.  Learn and use proper technique and have a coach correct improper technique.  Wear a tennis elbow (counterforce) brace. PROGNOSIS  The course of this condition depends on the degree of the injury. If treated properly, acute cases (symptoms lasting less than 4 weeks) are often resolved in 2 to 6 weeks. Chronic (longer lasting cases) often resolve in 3 to 6 months but may require physical therapy. RELATED COMPLICATIONS   Frequently recurring symptoms, resulting in a chronic problem. Properly treating the problem the first time decreases frequency of recurrence.  Chronic inflammation, scarring tendon  degeneration, and partial tendon tear, requiring surgery.  Delayed healing or resolution of symptoms. TREATMENT  Treatment first involves the use of ice and medicine to reduce pain and inflammation. Strengthening and stretching exercises may help reduce discomfort if performed regularly. These exercises may be performed at home if the condition is an acute injury. Chronic cases may require a referral to a physical therapist for evaluation and treatment. Your caregiver may advise a corticosteroid injection to help reduce inflammation. Rarely, surgery is needed. MEDICATION  If pain medicine is needed, nonsteroidal anti-inflammatory medicines (aspirin and ibuprofen), or other minor pain relievers (acetaminophen), are often advised.  Do  not take pain medicine for 7 days before surgery.  Prescription pain relievers may be given, if your caregiver thinks they are needed. Use only as directed and only as much as you need.  Corticosteroid injections may be recommended. These injections should be reserved only for the most severe cases, because they can only be given a certain number of times. HEAT AND COLD  Cold treatment (icing) should be applied for 10 to 15 minutes every 2 to 3 hours for inflammation and pain, and immediately after activity that aggravates your symptoms. Use ice packs or an ice massage.  Heat treatment may be used before performing stretching and strengthening activities prescribed by your caregiver, physical therapist, or athletic trainer. Use a heat pack or a warm water soak. SEEK MEDICAL CARE IF: Symptoms get worse or do not improve in 2 weeks, despite treatment. EXERCISES  RANGE OF MOTION (ROM) AND STRETCHING EXERCISES - Epicondylitis, Lateral (Tennis Elbow) These exercises may help you when beginning to rehabilitate your injury. Your symptoms may go away with or without further involvement from your physician, physical therapist, or athletic trainer. While completing these  exercises, remember:   Restoring tissue flexibility helps normal motion to return to the joints. This allows healthier, less painful movement and activity.  An effective stretch should be held for at least 30 seconds.  A stretch should never be painful. You should only feel a gentle lengthening or release in the stretched tissue. RANGE OF MOTION - Wrist Flexion, Active-Assisted  Extend your right / left elbow with your fingers pointing down.*  Gently pull the back of your hand towards you, until you feel a gentle stretch on the top of your forearm.  Hold this position for __________ seconds. Repeat __________ times. Complete this exercise __________ times per day.  *If directed by your physician, physical therapist or athletic trainer, complete this stretch with your elbow bent, rather than extended. RANGE OF MOTION - Wrist Extension, Active-Assisted  Extend your right / left elbow and turn your palm upwards.*  Gently pull your palm and fingertips back, so your wrist extends and your fingers point more toward the ground.  You should feel a gentle stretch on the inside of your forearm.  Hold this position for __________ seconds. Repeat __________ times. Complete this exercise __________ times per day. *If directed by your physician, physical therapist or athletic trainer, complete this stretch with your elbow bent, rather than extended. STRETCH - Wrist Flexion  Place the back of your right / left hand on a tabletop, leaving your elbow slightly bent. Your fingers should point away from your body.  Gently press the back of your hand down onto the table by straightening your elbow. You should feel a stretch on the top of your forearm.  Hold this position for __________ seconds. Repeat __________ times. Complete this stretch __________ times per day.  STRETCH - Wrist Extension   Place your right / left fingertips on a tabletop, leaving your elbow slightly bent. Your fingers should  point backwards.  Gently press your fingers and palm down onto the table by straightening your elbow. You should feel a stretch on the inside of your forearm.  Hold this position for __________ seconds. Repeat __________ times. Complete this stretch __________ times per day.  STRENGTHENING EXERCISES - Epicondylitis, Lateral (Tennis Elbow) These exercises may help you when beginning to rehabilitate your injury. They may resolve your symptoms with or without further involvement from your physician, physical therapist, or athletic trainer. While completing  these exercises, remember:   Muscles can gain both the endurance and the strength needed for everyday activities through controlled exercises.  Complete these exercises as instructed by your physician, physical therapist or athletic trainer. Increase the resistance and repetitions only as guided.  You may experience muscle soreness or fatigue, but the pain or discomfort you are trying to eliminate should never worsen during these exercises. If this pain does get worse, stop and make sure you are following the directions exactly. If the pain is still present after adjustments, discontinue the exercise until you can discuss the trouble with your caregiver. STRENGTH - Wrist Flexors  Sit with your right / left forearm palm-up and fully supported on a table or countertop. Your elbow should be resting below the height of your shoulder. Allow your wrist to extend over the edge of the surface.  Loosely holding a __________ weight, or a piece of rubber exercise band or tubing, slowly curl your hand up toward your forearm.  Hold this position for __________ seconds. Slowly lower the wrist back to the starting position in a controlled manner. Repeat __________ times. Complete this exercise __________ times per day.  STRENGTH - Wrist Extensors  Sit with your right / left forearm palm-down and fully supported on a table or countertop. Your elbow should be  resting below the height of your shoulder. Allow your wrist to extend over the edge of the surface.  Loosely holding a __________ weight, or a piece of rubber exercise band or tubing, slowly curl your hand up toward your forearm.  Hold this position for __________ seconds. Slowly lower the wrist back to the starting position in a controlled manner. Repeat __________ times. Complete this exercise __________ times per day.  STRENGTH - Ulnar Deviators  Stand with a ____________________ weight in your right / left hand, or sit while holding a rubber exercise band or tubing, with your healthy arm supported on a table or countertop.  Move your wrist, so that your pinkie travels toward your forearm and your thumb moves away from your forearm.  Hold this position for __________ seconds and then slowly lower the wrist back to the starting position. Repeat __________ times. Complete this exercise __________ times per day STRENGTH - Radial Deviators  Stand with a ____________________ weight in your right / left hand, or sit while holding a rubber exercise band or tubing, with your injured arm supported on a table or countertop.  Raise your hand upward in front of you or pull up on the rubber tubing.  Hold this position for __________ seconds and then slowly lower the wrist back to the starting position. Repeat __________ times. Complete this exercise __________ times per day. STRENGTH - Forearm Supinators   Sit with your right / left forearm supported on a table, keeping your elbow below shoulder height. Rest your hand over the edge, palm down.  Gently grip a hammer or a soup ladle.  Without moving your elbow, slowly turn your palm and hand upward to a "thumbs-up" position.  Hold this position for __________ seconds. Slowly return to the starting position. Repeat __________ times. Complete this exercise __________ times per day.  STRENGTH - Forearm Pronators   Sit with your right / left  forearm supported on a table, keeping your elbow below shoulder height. Rest your hand over the edge, palm up.  Gently grip a hammer or a soup ladle.  Without moving your elbow, slowly turn your palm and hand upward to a "thumbs-up" position.  Hold this position for __________ seconds. Slowly return to the starting position. Repeat __________ times. Complete this exercise __________ times per day.  STRENGTH - Grip  Grasp a tennis ball, a dense sponge, or a large, rolled sock in your hand.  Squeeze as hard as you can, without increasing any pain.  Hold this position for __________ seconds. Release your grip slowly. Repeat __________ times. Complete this exercise __________ times per day.  STRENGTH - Elbow Extensors, Isometric  Stand or sit upright, on a firm surface. Place your right / left arm so that your palm faces your stomach, and it is at the height of your waist.  Place your opposite hand on the underside of your forearm. Gently push up as your right / left arm resists. Push as hard as you can with both arms, without causing any pain or movement at your right / left elbow. Hold this stationary position for __________ seconds. Gradually release the tension in both arms. Allow your muscles to relax completely before repeating.   This information is not intended to replace advice given to you by your health care provider. Make sure you discuss any questions you have with your health care provider.   Document Released: 01/16/2005 Document Revised: 02/06/2014 Document Reviewed: 04/30/2008 Elsevier Interactive Patient Education [employer].

## 2015-07-23 NOTE — Progress Notes (Signed)
BP 104/65 mmHg  Pulse 75  Temp(Src) 98.1 F (36.7 C)  Ht 5' 7.6" (1.717 m)  Wt 161 lb 3.2 oz (73.12 kg)  BMI 24.80 kg/m2  SpO2 99%  LMP  (LMP Unknown)   Subjective:    Patient ID: Suzanne Scott, female    DOB: April 29, 1960, 55 y.o.   MRN: [id number]  HPI: Suzanne Scott is a 55 y.o. female  Chief Complaint  Patient presents with  . Annual Exam    Hep c order entered   Past Surgical History  Procedure Laterality Date  . Breast biopsy Left 04/08/2015    path pending   Past Medical History  Diagnosis Date  . Anxiety   . Hyperlipidemia   . Atrophic vaginitis    Family History  Problem Relation Age of Onset  . Arthritis Mother   . Heart disease Mother   . Breast cancer Neg Hx    Social History   Social History  . Marital Status: Married    Spouse Name: N/A  . Number of Children: 3  . Years of Education: 80   Social History Main Topics  . Smoking status: Never Smoker   . Smokeless tobacco: Never Used  . Alcohol Use: 0.0 oz/week    0 Standard drinks or equivalent per week     Comment: pt states she drinks wine on occassion  . Drug Use: No  . Sexual Activity: Yes   Other Topics Concern  . None   Social History Narrative      Relevant past medical, surgical, family and social history reviewed and updated as indicated. Interim medical history since our last visit reviewed. Allergies and medications reviewed and updated.  Review of Systems  Constitutional: Negative.   HENT: Positive for ear pain and hearing loss.   Respiratory: Negative.   Cardiovascular: Negative.   Gastrointestinal: Negative.   Endocrine: Negative.   Genitourinary: Negative.   Musculoskeletal: Negative.   Skin: Negative.   Allergic/Immunologic: Negative.   Neurological: Negative.   Hematological: Negative.   Psychiatric/Behavioral: Negative.     Per HPI unless specifically indicated above     Objective:    BP 104/65 mmHg  Pulse 75  Temp(Src) 98.1 F (36.7 C)  Ht 5'  7.6" (1.717 m)  Wt 161 lb 3.2 oz (73.12 kg)  BMI 24.80 kg/m2  SpO2 99%  LMP  (LMP Unknown)  Wt Readings from Last 3 Encounters:  07/23/15 161 lb 3.2 oz (73.12 kg)  11/30/14 160 lb (72.576 kg)  07/22/14 164 lb 3.2 oz (74.481 kg)    Physical Exam  Constitutional: She is oriented to person, place, and time. She appears well-developed and well-nourished.  HENT:  Head: Normocephalic and atraumatic.  Eyes: Pupils are equal, round, and reactive to light. Right eye exhibits no discharge. Left eye exhibits no discharge. No scleral icterus.  Neck: Normal range of motion. Neck supple. Carotid bruit is not present. No thyromegaly present.  Cardiovascular: Normal rate, regular rhythm and normal heart sounds.  Exam reveals no gallop and no friction rub.   No murmur heard. Pulmonary/Chest: Effort normal and breath sounds normal. No respiratory distress. She has no wheezes. She has no rales.  Abdominal: Soft. Bowel sounds are normal. There is no tenderness. There is no rebound.  Genitourinary:  Breast and gyn exam through Gyn  Musculoskeletal: Normal range of motion.  Lymphadenopathy:    She has no cervical adenopathy.  Neurological: She is alert and oriented to person, place, and time.  Skin: Skin is warm, dry and intact. No rash noted.  Psychiatric: She has a normal mood and affect. Her speech is normal and behavior is normal. Judgment and thought content normal. Cognition and memory are normal.   Attempted to irrigate ear and unsuccessful.  Use OTC treatments.    Assessment & Plan:   Problem List Items Addressed This Visit      Unprioritized   Hyperlipidemia   Relevant Orders   Lipid Panel w/o Chol/HDL Ratio    Other Visit Diagnoses    Health care maintenance    -  Primary    Relevant Orders    Hepatitis C antibody    Lipid Panel w/o Chol/HDL Ratio    Comprehensive metabolic panel    CBC with Differential/Platelet    TSH    Cerumen debris on tympanic membrane of left ear         Unable to flush out today.  Use OTC and return to clinic for ear wax removal if needed        Follow up plan: Return if symptoms worsen or fail to improve.

## 2015-07-24 LAB — CBC WITH DIFFERENTIAL/PLATELET
BASOS ABS: 0 10*3/uL (ref 0.0–0.2)
Basos: 0 %
EOS (ABSOLUTE): 0.1 10*3/uL (ref 0.0–0.4)
Eos: 2 %
Hematocrit: 39.6 % (ref 34.0–46.6)
Hemoglobin: 12.6 g/dL (ref 11.1–15.9)
IMMATURE GRANS (ABS): 0 10*3/uL (ref 0.0–0.1)
Immature Granulocytes: 0 %
LYMPHS: 39 %
Lymphocytes Absolute: 1.8 10*3/uL (ref 0.7–3.1)
MCH: 26.8 pg (ref 26.6–33.0)
MCHC: 31.8 g/dL (ref 31.5–35.7)
MCV: 84 fL (ref 79–97)
Monocytes Absolute: 0.5 10*3/uL (ref 0.1–0.9)
Monocytes: 10 %
NEUTROS ABS: 2.3 10*3/uL (ref 1.4–7.0)
NEUTROS PCT: 49 %
PLATELETS: 225 10*3/uL (ref 150–379)
RBC: 4.7 x10E6/uL (ref 3.77–5.28)
RDW: 15.2 % (ref 12.3–15.4)
WBC: 4.6 10*3/uL (ref 3.4–10.8)

## 2015-07-24 LAB — LIPID PANEL W/O CHOL/HDL RATIO
CHOLESTEROL TOTAL: 222 mg/dL — AB (ref 100–199)
HDL: 40 mg/dL (ref >39)
LDL CALC: 150 mg/dL — AB (ref 0–99)
TRIGLYCERIDES: 162 mg/dL — AB (ref 0–149)
VLDL CHOLESTEROL CAL: 32 mg/dL (ref 5–40)

## 2015-07-24 LAB — COMPREHENSIVE METABOLIC PANEL
A/G RATIO: 1.9 (ref 1.2–2.2)
ALT: 28 IU/L (ref 0–32)
AST: 24 IU/L (ref 0–40)
Albumin: 4.3 g/dL (ref 3.5–5.5)
Alkaline Phosphatase: 63 IU/L (ref 39–117)
BILIRUBIN TOTAL: 0.3 mg/dL (ref 0.0–1.2)
BUN/Creatinine Ratio: 15 (ref 9–23)
BUN: 11 mg/dL (ref 6–24)
CHLORIDE: 102 mmol/L (ref 96–106)
CO2: 25 mmol/L (ref 18–29)
Calcium: 9.3 mg/dL (ref 8.7–10.2)
Creatinine, Ser: 0.75 mg/dL (ref 0.57–1.00)
GFR calc non Af Amer: 89 mL/min/{1.73_m2} (ref >59)
GFR, EST AFRICAN AMERICAN: 103 mL/min/{1.73_m2} (ref >59)
Globulin, Total: 2.3 g/dL (ref 1.5–4.5)
Glucose: 82 mg/dL (ref 65–99)
POTASSIUM: 3.6 mmol/L (ref 3.5–5.2)
Sodium: 142 mmol/L (ref 134–144)
Total Protein: 6.6 g/dL (ref 6.0–8.5)

## 2015-07-24 LAB — HEPATITIS C ANTIBODY

## 2015-07-24 LAB — TSH: TSH: 0.531 u[IU]/mL (ref 0.450–4.500)

## 2015-07-26 ENCOUNTER — Encounter: Payer: Self-pay | Admitting: Unknown Physician Specialty

## 2015-10-15 ENCOUNTER — Encounter: Payer: Self-pay | Admitting: Family Medicine

## 2015-10-15 ENCOUNTER — Ambulatory Visit (INDEPENDENT_AMBULATORY_CARE_PROVIDER_SITE_OTHER): Payer: Commercial Managed Care - PPO | Admitting: Family Medicine

## 2015-10-15 VITALS — BP 104/63 | HR 67 | Temp 99.0°F | Wt 159.0 lb

## 2015-10-15 DIAGNOSIS — R300 Dysuria: Secondary | ICD-10-CM | POA: Diagnosis not present

## 2015-10-15 LAB — MICROSCOPIC EXAMINATION

## 2015-10-15 LAB — UA/M W/RFLX CULTURE, ROUTINE
BILIRUBIN UA: NEGATIVE
GLUCOSE, UA: NEGATIVE
KETONES UA: NEGATIVE
LEUKOCYTES UA: NEGATIVE
NITRITE UA: NEGATIVE
Protein, UA: NEGATIVE
SPEC GRAV UA: 1.02 (ref 1.005–1.030)
Urobilinogen, Ur: 0.2 mg/dL (ref 0.2–1.0)
pH, UA: 6 (ref 5.0–7.5)

## 2015-10-15 NOTE — Progress Notes (Signed)
BP 104/63 (BP Location: Left Arm, Patient Position: Sitting, Cuff Size: Normal)   Pulse 67   Temp 99 F (37.2 C)   Wt 159 lb (72.1 kg)   LMP  (LMP Unknown)   SpO2 99%   BMI 24.46 kg/m    Subjective:    Patient ID: Suzanne Scott, female    DOB: 04-10-1960, 55 y.o.   MRN: [id number]  HPI: Suzanne Scott is a 55 y.o. female  Chief Complaint  Patient presents with  . Urinary Tract Infection   URINARY SYMPTOMS Duration: Few weeks Dysuria: burning Urinary frequency: yes Urgency: yes Small volume voids: no Symptom severity: moderate Urinary incontinence: no Foul odor: yes Hematuria: no Abdominal pain: no Back pain: yes Suprapubic pain/pressure: yes Flank pain: yes Fever:  no Vomiting: no Relief with cranberry juice: no Relief with pyridium: no Status: stable Previous urinary tract infection: yes Recurrent urinary tract infection: no Sexual activity: monogomous History of sexually transmitted disease: no Vaginal discharge: no Treatments attempted: cranberry and increasing fluids    Relevant past medical, surgical, family and social history reviewed and updated as indicated. Interim medical history since our last visit reviewed. Allergies and medications reviewed and updated.  Review of Systems  Constitutional: Negative.   Respiratory: Negative.   Cardiovascular: Negative.   Psychiatric/Behavioral: Negative.     Per HPI unless specifically indicated above     Objective:    BP 104/63 (BP Location: Left Arm, Patient Position: Sitting, Cuff Size: Normal)   Pulse 67   Temp 99 F (37.2 C)   Wt 159 lb (72.1 kg)   LMP  (LMP Unknown)   SpO2 99%   BMI 24.46 kg/m   Wt Readings from Last 3 Encounters:  10/15/15 159 lb (72.1 kg)  07/23/15 161 lb 3.2 oz (73.1 kg)  11/30/14 160 lb (72.6 kg)    Physical Exam  Constitutional: She is oriented to person, place, and time. She appears well-developed and well-nourished. No distress.  HENT:  Head: Normocephalic  and atraumatic.  Right Ear: Hearing normal.  Left Ear: Hearing normal.  Nose: Nose normal.  Eyes: Conjunctivae and lids are normal. Right eye exhibits no discharge. Left eye exhibits no discharge. No scleral icterus.  Cardiovascular: Normal rate, regular rhythm, normal heart sounds and intact distal pulses.  Exam reveals no friction rub.   No murmur heard. Pulmonary/Chest: Effort normal and breath sounds normal. No respiratory distress. She has no wheezes. She has no rales.  Abdominal: Soft. Bowel sounds are normal. She exhibits no distension and no mass. There is no tenderness. There is no rebound and no guarding.  Musculoskeletal: Normal range of motion.  Neurological: She is alert and oriented to person, place, and time.  Skin: Skin is warm, dry and intact. No rash noted. No erythema. No pallor.  Psychiatric: She has a normal mood and affect. Her speech is normal and behavior is normal. Judgment and thought content normal. Cognition and memory are normal.  Nursing note and vitals reviewed.   Results for orders placed or performed in visit on 07/23/15  Hepatitis C antibody  Result Value Ref Range   Hep C Virus Ab <0.1 0.0 - 0.9 s/co ratio  Lipid Panel w/o Chol/HDL Ratio  Result Value Ref Range   Cholesterol, Total 222 (H) 100 - 199 mg/dL   Triglycerides 162 (H) 0 - 149 mg/dL   HDL 40 >39 mg/dL   VLDL Cholesterol Cal 32 5 - 40 mg/dL   LDL Calculated 150 (H) 0 -  99 mg/dL  Comprehensive metabolic panel  Result Value Ref Range   Glucose 82 65 - 99 mg/dL   BUN 11 6 - 24 mg/dL   Creatinine, Ser 0.75 0.57 - 1.00 mg/dL   GFR calc non Af Amer 89 >59 mL/min/1.73   GFR calc Af Amer 103 >59 mL/min/1.73   BUN/Creatinine Ratio 15 9 - 23   Sodium 142 134 - 144 mmol/L   Potassium 3.6 3.5 - 5.2 mmol/L   Chloride 102 96 - 106 mmol/L   CO2 25 18 - 29 mmol/L   Calcium 9.3 8.7 - 10.2 mg/dL   Total Protein 6.6 6.0 - 8.5 g/dL   Albumin 4.3 3.5 - 5.5 g/dL   Globulin, Total 2.3 1.5 - 4.5 g/dL    Albumin/Globulin Ratio 1.9 1.2 - 2.2   Bilirubin Total 0.3 0.0 - 1.2 mg/dL   Alkaline Phosphatase 63 39 - 117 IU/L   AST 24 0 - 40 IU/L   ALT 28 0 - 32 IU/L  CBC with Differential/Platelet  Result Value Ref Range   WBC 4.6 3.4 - 10.8 x10E3/uL   RBC 4.70 3.77 - 5.28 x10E6/uL   Hemoglobin 12.6 11.1 - 15.9 g/dL   Hematocrit 39.6 34.0 - 46.6 %   MCV 84 79 - 97 fL   MCH 26.8 26.6 - 33.0 pg   MCHC 31.8 31.5 - 35.7 g/dL   RDW 15.2 12.3 - 15.4 %   Platelets 225 150 - 379 x10E3/uL   Neutrophils 49 %   Lymphs 39 %   Monocytes 10 %   Eos 2 %   Basos 0 %   Neutrophils Absolute 2.3 1.4 - 7.0 x10E3/uL   Lymphocytes Absolute 1.8 0.7 - 3.1 x10E3/uL   Monocytes Absolute 0.5 0.1 - 0.9 x10E3/uL   EOS (ABSOLUTE) 0.1 0.0 - 0.4 x10E3/uL   Basophils Absolute 0.0 0.0 - 0.2 x10E3/uL   Immature Granulocytes 0 %   Immature Grans (Abs) 0.0 0.0 - 0.1 x10E3/uL  TSH  Result Value Ref Range   TSH 0.531 0.450 - 4.500 uIU/mL      Assessment & Plan:   Problem List Items Addressed This Visit    None    Visit Diagnoses    Dysuria    -  Primary   Negative urine. Push fluids, if not better by Monday, recheck urine.    Relevant Orders   UA/M w/rflx Culture, Routine       Follow up plan: Return if symptoms worsen or fail to improve.

## 2015-12-13 ENCOUNTER — Encounter: Payer: Self-pay | Admitting: Unknown Physician Specialty

## 2015-12-13 ENCOUNTER — Ambulatory Visit (INDEPENDENT_AMBULATORY_CARE_PROVIDER_SITE_OTHER): Payer: Commercial Managed Care - PPO | Admitting: Unknown Physician Specialty

## 2015-12-13 DIAGNOSIS — F419 Anxiety disorder, unspecified: Secondary | ICD-10-CM

## 2015-12-13 MED ORDER — CITALOPRAM HYDROBROMIDE 20 MG PO TABS
20.0000 mg | ORAL_TABLET | Freq: Every day | ORAL | 3 refills | Status: DC
Start: 2015-12-13 — End: 2016-04-10

## 2015-12-13 NOTE — Progress Notes (Signed)
   BP 139/83 (BP Location: Left Arm, Patient Position: Sitting, Cuff Size: Normal)   Pulse 75   Temp 98 F (36.7 C)   Ht 5' 8.7" (1.745 m) Comment: pt had shoes on  Wt 157 lb 9.6 oz (71.5 kg) Comment: pt had shoes on  LMP  (LMP Unknown)   SpO2 100%   BMI 23.48 kg/m    Subjective:    Patient ID: Suzanne Scott, female    DOB: January 27, 1961, 55 y.o.   MRN: [id number]  HPI: Suzanne Scott is a 55 y.o. female  Chief Complaint  Patient presents with  . Anxiety    pt states she has been feeling anxious, heart palpitating, and sweating for about 3 weeks now    Pt states she had a lot going on the last 2 months and finds she is having heart palpitations, nervous, scared and tensin on right side of head. She notes her hair is falling out.    Depression screen [facility] 2/9 12/13/2015 07/23/2015  Decreased Interest 0 1  Down, Depressed, Hopeless 2 0  PHQ - 2 Score 2 1  Altered sleeping 1 -  Tired, decreased energy 1 -  Change in appetite 3 -  Feeling bad or failure about yourself  0 -  Trouble concentrating 3 -  Moving slowly or fidgety/restless 3 -  Suicidal thoughts 0 -  PHQ-9 Score 13 -       Relevant past medical, surgical, family and social history reviewed and updated as indicated. Interim medical history since our last visit reviewed. Allergies and medications reviewed and updated.  Review of Systems  Per HPI unless specifically indicated above     Objective:    BP 139/83 (BP Location: Left Arm, Patient Position: Sitting, Cuff Size: Normal)   Pulse 75   Temp 98 F (36.7 C)   Ht 5' 8.7" (1.745 m) Comment: pt had shoes on  Wt 157 lb 9.6 oz (71.5 kg) Comment: pt had shoes on  LMP  (LMP Unknown)   SpO2 100%   BMI 23.48 kg/m   Wt Readings from Last 3 Encounters:  12/13/15 157 lb 9.6 oz (71.5 kg)  10/15/15 159 lb (72.1 kg)  07/23/15 161 lb 3.2 oz (73.1 kg)    Physical Exam  Constitutional: She is oriented to person, place, and time. She appears well-developed and  well-nourished. No distress.  HENT:  Head: Normocephalic and atraumatic.  Eyes: Conjunctivae and lids are normal. Right eye exhibits no discharge. Left eye exhibits no discharge. No scleral icterus.  Cardiovascular: Normal rate.   Pulmonary/Chest: Effort normal.  Abdominal: Normal appearance. There is no splenomegaly or hepatomegaly.  Musculoskeletal: Normal range of motion.  Neurological: She is alert and oriented to person, place, and time.  Skin: Skin is intact. No rash noted. No pallor.  Psychiatric: She has a normal mood and affect. Her behavior is normal. Judgment and thought content normal.       Assessment & Plan:   Problem List Items Addressed This Visit      Unprioritized   Anxiety    rx for Citalopram      Relevant Medications   citalopram (CELEXA) 20 MG tablet       Follow up plan: Return in about 4 weeks (around 01/10/2016).

## 2015-12-13 NOTE — Assessment & Plan Note (Signed)
rx for Citalopram

## 2016-01-10 ENCOUNTER — Ambulatory Visit (INDEPENDENT_AMBULATORY_CARE_PROVIDER_SITE_OTHER): Payer: Commercial Managed Care - PPO | Admitting: Unknown Physician Specialty

## 2016-01-10 ENCOUNTER — Encounter: Payer: Self-pay | Admitting: Unknown Physician Specialty

## 2016-01-10 VITALS — BP 117/74 | HR 73 | Temp 97.9°F | Wt 156.4 lb

## 2016-01-10 DIAGNOSIS — F419 Anxiety disorder, unspecified: Secondary | ICD-10-CM

## 2016-01-10 DIAGNOSIS — J00 Acute nasopharyngitis [common cold]: Secondary | ICD-10-CM | POA: Diagnosis not present

## 2016-01-10 NOTE — Progress Notes (Signed)
BP 117/74 (BP Location: Left Arm, Patient Position: Sitting, Cuff Size: Large)   Pulse 73   Temp 97.9 F (36.6 C)   Wt 156 lb 6.4 oz (70.9 kg)   LMP  (LMP Unknown)   SpO2 98%   BMI 23.30 kg/m    Subjective:    Patient ID: Suzanne Scott, female    DOB: Sep 17, 1960, 55 y.o.   MRN: [id number]  HPI: Suzanne Scott is a 55 y.o. female  Chief Complaint  Patient presents with  . Anxiety    4 week f/up- pt states she has been taking 10 mg of citalopram instead of 20   Depression/Anxiety Pt is here for f/u of her depression and anxiety.  States she is a little better but only taking 1/2 of the Citalopram as a whole pill makes her dizzy.    Anxiety     URI   This is a new problem. Episode onset: 2 days. The problem has been gradually worsening. There has been no fever. Associated symptoms include congestion, coughing and rhinorrhea. Pertinent negatives include no sinus pain. She has tried nothing for the symptoms.    Depression screen [facility] 2/9 01/10/2016 12/13/2015 07/23/2015  Decreased Interest 0 0 1  Down, Depressed, Hopeless 0 2 0  PHQ - 2 Score 0 2 1  Altered sleeping 3 1 -  Tired, decreased energy 2 1 -  Change in appetite 3 3 -  Feeling bad or failure about yourself  0 0 -  Trouble concentrating 3 3 -  Moving slowly or fidgety/restless 0 3 -  Suicidal thoughts 0 0 -  PHQ-9 Score 11 13 -   Relevant past medical, surgical, family and social history reviewed and updated as indicated. Interim medical history since our last visit reviewed. Allergies and medications reviewed and updated.  Review of Systems  HENT: Positive for congestion and rhinorrhea. Negative for sinus pain.   Respiratory: Positive for cough.     Per HPI unless specifically indicated above     Objective:    BP 117/74 (BP Location: Left Arm, Patient Position: Sitting, Cuff Size: Large)   Pulse 73   Temp 97.9 F (36.6 C)   Wt 156 lb 6.4 oz (70.9 kg)   LMP  (LMP Unknown)   SpO2 98%   BMI 23.30  kg/m   Wt Readings from Last 3 Encounters:  01/10/16 156 lb 6.4 oz (70.9 kg)  12/13/15 157 lb 9.6 oz (71.5 kg)  10/15/15 159 lb (72.1 kg)    Physical Exam  Constitutional: She is oriented to person, place, and time. She appears well-developed and well-nourished. No distress.  HENT:  Head: Normocephalic and atraumatic.  Right Ear: Tympanic membrane and ear canal normal.  Left Ear: Tympanic membrane and ear canal normal.  Nose: Rhinorrhea present. Right sinus exhibits no maxillary sinus tenderness and no frontal sinus tenderness. Left sinus exhibits no maxillary sinus tenderness and no frontal sinus tenderness.  Mouth/Throat: Mucous membranes are normal. Posterior oropharyngeal erythema present.  Eyes: Conjunctivae and lids are normal. Right eye exhibits no discharge. Left eye exhibits no discharge. No scleral icterus.  Cardiovascular: Normal rate and regular rhythm.   Pulmonary/Chest: Effort normal and breath sounds normal. No respiratory distress.  Abdominal: Normal appearance. There is no splenomegaly or hepatomegaly.  Musculoskeletal: Normal range of motion.  Neurological: She is alert and oriented to person, place, and time.  Skin: Skin is intact. No rash noted. No pallor.  Psychiatric: She has a normal mood and  affect. Her behavior is normal. Judgment and thought content normal.    Results for orders placed or performed in visit on 10/15/15  Microscopic Examination  Result Value Ref Range   WBC, UA 0-5 0 - 5 /hpf   RBC, UA 3-10 (A) 0 - 2 /hpf   Epithelial Cells (non renal) 0-10 0 - 10 /hpf   Bacteria, UA Few None seen/Few  UA/M w/rflx Culture, Routine  Result Value Ref Range   Specific Gravity, UA 1.020 1.005 - 1.030   pH, UA 6.0 5.0 - 7.5   Color, UA Yellow Yellow   Appearance Ur Clear Clear   Leukocytes, UA Negative Negative   Protein, UA Negative Negative/Trace   Glucose, UA Negative Negative   Ketones, UA Negative Negative   RBC, UA 1+ (A) Negative   Bilirubin, UA  Negative Negative   Urobilinogen, Ur 0.2 0.2 - 1.0 mg/dL   Nitrite, UA Negative Negative   Microscopic Examination See below:       Assessment & Plan:   Problem List Items Addressed This Visit      Unprioritized   Anxiety    Some mild improvement.  Increase Citalopram to 20 mg/day.  If it keeps her awake, change to morning administration.         Other Visit Diagnoses    Acute nasopharyngitis    -  Primary   supportive care       Follow up plan: Return in about 4 weeks (around 02/07/2016).

## 2016-01-10 NOTE — Assessment & Plan Note (Addendum)
Some mild improvement.  Increase Citalopram to 20 mg/day.  If it keeps her awake, change to morning administration.

## 2016-01-10 NOTE — Patient Instructions (Addendum)
Upper Respiratory Infection, Adult Most upper respiratory infections (URIs) are a viral infection of the air passages leading to the lungs. A URI affects the nose, throat, and upper air passages. The most common type of URI is nasopharyngitis and is typically referred to as "the common cold." URIs run their course and usually go away on their own. Most of the time, a URI does not require medical attention, but sometimes a bacterial infection in the upper airways can follow a viral infection. This is called a secondary infection. Sinus and middle ear infections are common types of secondary upper respiratory infections. Bacterial pneumonia can also complicate a URI. A URI can worsen asthma and chronic obstructive pulmonary disease (COPD). Sometimes, these complications can require emergency medical care and may be life threatening. What are the causes? Almost all URIs are caused by viruses. A virus is a type of germ and can spread from one person to another. What increases the risk? You may be at risk for a URI if:  You smoke.  You have chronic heart or lung disease.  You have a weakened defense (immune) system.  You are very young or very old.  You have nasal allergies or asthma.  You work in crowded or poorly ventilated areas.  You work in health care facilities or schools.  What are the signs or symptoms? Symptoms typically develop 2-3 days after you come in contact with a cold virus. Most viral URIs last 7-10 days. However, viral URIs from the influenza virus (flu virus) can last 14-18 days and are typically more severe. Symptoms may include:  Runny or stuffy (congested) nose.  Sneezing.  Cough.  Sore throat.  Headache.  Fatigue.  Fever.  Loss of appetite.  Pain in your forehead, behind your eyes, and over your cheekbones (sinus pain).  Muscle aches.  How is this diagnosed? Your health care provider may diagnose a URI by:  Physical exam.  Tests to check that your  symptoms are not due to another condition such as: ? Strep throat. ? Sinusitis. ? Pneumonia. ? Asthma.  How is this treated? A URI goes away on its own with time. It cannot be cured with medicines, but medicines may be prescribed or recommended to relieve symptoms. Medicines may help:  Reduce your fever.  Reduce your cough.  Relieve nasal congestion.  Follow these instructions at home:  Take medicines only as directed by your health care provider.  Gargle warm saltwater or take cough drops to comfort your throat as directed by your health care provider.  Use a warm mist humidifier or inhale steam from a shower to increase air moisture. This may make it easier to breathe.  Drink enough fluid to keep your urine clear or pale yellow.  Eat soups and other clear broths and maintain good nutrition.  Rest as needed.  Return to work when your temperature has returned to normal or as your health care provider advises. You may need to stay home longer to avoid infecting others. You can also use a face mask and careful hand washing to prevent spread of the virus.  Increase the usage of your inhaler if you have asthma.  Do not use any tobacco products, including cigarettes, chewing tobacco, or electronic cigarettes. If you need help quitting, ask your health care provider. How is this prevented? The best way to protect yourself from getting a cold is to practice good hygiene.  Avoid oral or hand contact with people with cold symptoms.  Wash your   hands often if contact occurs.  There is no clear evidence that vitamin C, vitamin E, echinacea, or exercise reduces the chance of developing a cold. However, it is always recommended to get plenty of rest, exercise, and practice good nutrition. Contact a health care provider if:  You are getting worse rather than better.  Your symptoms are not controlled by medicine.  You have chills.  You have worsening shortness of breath.  You have  brown or red mucus.  You have yellow or brown nasal discharge.  You have pain in your face, especially when you bend forward.  You have a fever.  You have swollen neck glands.  You have pain while swallowing.  You have white areas in the back of your throat. Get help right away if:  You have severe or persistent: ? Headache. ? Ear pain. ? Sinus pain. ? Chest pain.  You have chronic lung disease and any of the following: ? Wheezing. ? Prolonged cough. ? Coughing up blood. ? A change in your usual mucus.  You have a stiff neck.  You have changes in your: ? Vision. ? Hearing. ? Thinking. ? Mood. This information is not intended to replace advice given to you by your health care provider. Make sure you discuss any questions you have with your health care provider. Document Released: 07/12/2000 Document Revised: 09/19/2015 Document Reviewed: 04/23/2013 Elsevier Interactive Patient Education  2017 [employer]  

## 2016-01-12 ENCOUNTER — Telehealth: Payer: Self-pay | Admitting: Unknown Physician Specialty

## 2016-01-12 MED ORDER — AMOXICILLIN 875 MG PO TABS: 875.0000 mg | ORAL_TABLET | Freq: Two times a day (BID) | ORAL | 0 refills | Status: DC

## 2016-01-12 NOTE — Telephone Encounter (Signed)
OK, done

## 2016-01-12 NOTE — Telephone Encounter (Signed)
Patient was seen here Monday, she is wanting to see if Malachy Mood will call her in an antibiotic called in to [facility] in [place] [facility] cell

## 2016-01-12 NOTE — Telephone Encounter (Signed)
Routing to provider  

## 2016-01-12 NOTE — Telephone Encounter (Signed)
Called and left patient a VM letting her know that a medication was sent in for her.  

## 2016-02-07 ENCOUNTER — Ambulatory Visit: Payer: Commercial Managed Care - PPO | Admitting: Unknown Physician Specialty

## 2016-02-09 ENCOUNTER — Ambulatory Visit (INDEPENDENT_AMBULATORY_CARE_PROVIDER_SITE_OTHER): Payer: Commercial Managed Care - PPO | Admitting: Unknown Physician Specialty

## 2016-02-09 ENCOUNTER — Encounter: Payer: Self-pay | Admitting: Unknown Physician Specialty

## 2016-02-09 VITALS — BP 127/80 | HR 79 | Temp 98.6°F | Wt 160.0 lb

## 2016-02-09 DIAGNOSIS — F32A Depression, unspecified: Secondary | ICD-10-CM | POA: Insufficient documentation

## 2016-02-09 DIAGNOSIS — F324 Major depressive disorder, single episode, in partial remission: Secondary | ICD-10-CM | POA: Diagnosis not present

## 2016-02-09 DIAGNOSIS — K529 Noninfective gastroenteritis and colitis, unspecified: Secondary | ICD-10-CM | POA: Diagnosis not present

## 2016-02-09 DIAGNOSIS — F329 Major depressive disorder, single episode, unspecified: Secondary | ICD-10-CM | POA: Insufficient documentation

## 2016-02-09 NOTE — Progress Notes (Signed)
BP 127/80 (BP Location: Left Arm, Patient Position: Sitting, Cuff Size: Large)   Pulse 79   Temp 98.6 F (37 C)   Wt 160 lb (72.6 kg)   LMP  (LMP Unknown)   SpO2 98%   BMI 23.83 kg/m    Subjective:    Patient ID: Suzanne Scott, female    DOB: 03-27-1960, 56 y.o.   MRN: [id number]  HPI: Suzanne Scott is a 56 y.o. female  Chief Complaint  Patient presents with  . Depression    4 week f/up  . Abdominal Pain    pt states that she started having some stomach pain, fatigue, and weakness yesterday. States that a virus was going around her job.   Depression States she is doing well with biggest problem staying asleep.  She goes to bed around 10 and wakes up around 5.  Does wake up at 2 for about 45 minutes.  States her husband has a TV in the room.    Depression screen [facility] 2/9 02/09/2016 01/10/2016 12/13/2015 07/23/2015  Decreased Interest 0 0 0 1  Down, Depressed, Hopeless 0 0 2 0  PHQ - 2 Score 0 0 2 1  Altered sleeping 3 3 1  -  Tired, decreased energy 1 2 1  -  Change in appetite 1 3 3  -  Feeling bad or failure about yourself  0 0 0 -  Trouble concentrating 1 3 3  -  Moving slowly or fidgety/restless 0 0 3 -  Suicidal thoughts 0 0 0 -  PHQ-9 Score 6 11 13  -   Abdominal Pain  This is a new problem. The current episode started yesterday. The onset quality is sudden. The problem occurs intermittently. The problem has been waxing and waning. The pain is located in the epigastric region. The quality of the pain is dull. The abdominal pain does not radiate. Associated symptoms include nausea. Pertinent negatives include no diarrhea, fever or vomiting. Nothing aggravates the pain. The pain is relieved by nothing. Treatments tried: prune juice.    Relevant past medical, surgical, family and social history reviewed and updated as indicated. Interim medical history since our last visit reviewed. Allergies and medications reviewed and updated.  Review of Systems  Constitutional:  Negative for fever.  Gastrointestinal: Positive for abdominal pain and nausea. Negative for diarrhea and vomiting.    Per HPI unless specifically indicated above     Objective:    BP 127/80 (BP Location: Left Arm, Patient Position: Sitting, Cuff Size: Large)   Pulse 79   Temp 98.6 F (37 C)   Wt 160 lb (72.6 kg)   LMP  (LMP Unknown)   SpO2 98%   BMI 23.83 kg/m   Wt Readings from Last 3 Encounters:  02/09/16 160 lb (72.6 kg)  01/10/16 156 lb 6.4 oz (70.9 kg)  12/13/15 157 lb 9.6 oz (71.5 kg)    Physical Exam  Constitutional: She is oriented to person, place, and time. She appears well-developed and well-nourished. No distress.  HENT:  Head: Normocephalic and atraumatic.  Eyes: Conjunctivae and lids are normal. Right eye exhibits no discharge. Left eye exhibits no discharge. No scleral icterus.  Neck: Normal range of motion. Neck supple. No JVD present. Carotid bruit is not present.  Cardiovascular: Normal rate, regular rhythm and normal heart sounds.   Pulmonary/Chest: Effort normal and breath sounds normal.  Abdominal: Normal appearance. There is no splenomegaly or hepatomegaly.  Musculoskeletal: Normal range of motion.  Neurological: She is alert and oriented  to person, place, and time.  Skin: Skin is warm, dry and intact. No rash noted. No pallor.  Psychiatric: She has a normal mood and affect. Her behavior is normal. Judgment and thought content normal.    Results for orders placed or performed in visit on 10/15/15  Microscopic Examination  Result Value Ref Range   WBC, UA 0-5 0 - 5 /hpf   RBC, UA 3-10 (A) 0 - 2 /hpf   Epithelial Cells (non renal) 0-10 0 - 10 /hpf   Bacteria, UA Few None seen/Few  UA/M w/rflx Culture, Routine  Result Value Ref Range   Specific Gravity, UA 1.020 1.005 - 1.030   pH, UA 6.0 5.0 - 7.5   Color, UA Yellow Yellow   Appearance Ur Clear Clear   Leukocytes, UA Negative Negative   Protein, UA Negative Negative/Trace   Glucose, UA Negative  Negative   Ketones, UA Negative Negative   RBC, UA 1+ (A) Negative   Bilirubin, UA Negative Negative   Urobilinogen, Ur 0.2 0.2 - 1.0 mg/dL   Nitrite, UA Negative Negative   Microscopic Examination See below:   nnnn    Assessment & Plan:   Problem List Items Addressed This Visit      Unprioritized   Depression    improved       Other Visit Diagnoses    Gastroenteritis    -  Primary   Supportive care       Follow up plan: Return in about 6 months (around 08/08/2016).

## 2016-02-09 NOTE — Assessment & Plan Note (Signed)
improved

## 2016-03-13 ENCOUNTER — Telehealth: Payer: Self-pay | Admitting: Unknown Physician Specialty

## 2016-03-13 DIAGNOSIS — M722 Plantar fascial fibromatosis: Secondary | ICD-10-CM

## 2016-03-13 NOTE — Telephone Encounter (Signed)
Patient is having pain in right heel for several weeks and would like to be referred to a podiatrist.  Thanks  Suzanne Scott [phone]

## 2016-03-13 NOTE — Telephone Encounter (Signed)
Routing to provider  

## 2016-03-14 DIAGNOSIS — M722 Plantar fascial fibromatosis: Secondary | ICD-10-CM | POA: Insufficient documentation

## 2016-03-14 NOTE — Telephone Encounter (Signed)
Called and left patient a VM (not detailed) letting her know that referral was entered. I asked for the patient to give [place] a call if she does not hear anything either from our office or the other office within a week or two.

## 2016-04-04 ENCOUNTER — Ambulatory Visit (INDEPENDENT_AMBULATORY_CARE_PROVIDER_SITE_OTHER): Payer: Commercial Managed Care - PPO

## 2016-04-04 ENCOUNTER — Ambulatory Visit (INDEPENDENT_AMBULATORY_CARE_PROVIDER_SITE_OTHER): Payer: Commercial Managed Care - PPO | Admitting: Podiatry

## 2016-04-04 ENCOUNTER — Encounter: Payer: Self-pay | Admitting: Podiatry

## 2016-04-04 DIAGNOSIS — M79671 Pain in right foot: Secondary | ICD-10-CM

## 2016-04-04 DIAGNOSIS — M722 Plantar fascial fibromatosis: Secondary | ICD-10-CM

## 2016-04-10 ENCOUNTER — Telehealth: Payer: Self-pay | Admitting: Podiatry

## 2016-04-10 ENCOUNTER — Other Ambulatory Visit: Payer: Self-pay | Admitting: Unknown Physician Specialty

## 2016-04-10 ENCOUNTER — Other Ambulatory Visit: Payer: Self-pay | Admitting: *Deleted

## 2016-04-10 MED ORDER — MELOXICAM 15 MG PO TABS: 15.0000 mg | ORAL_TABLET | Freq: Every day | ORAL | 1 refills | Status: DC

## 2016-04-10 NOTE — Telephone Encounter (Addendum)
Pt states she was to have rx for Meloxicam at [facility] in [place]. 04/11/2016-Left message informing pt the meloxicam had been ordered [facility].

## 2016-04-10 NOTE — Telephone Encounter (Signed)
Per Dr Amalia Hailey ordered Rx for Meloxicam to [facility] in [place]

## 2016-04-10 NOTE — Telephone Encounter (Signed)
Patient was seen by dr. Amalia Hailey on 04/04/16 he was going to call in Suzanne Scott to [facility] in [place] on [address]. They still dont have the RX for her. Please call in, and call pt to let her know that this has been sent to the pharmacy./

## 2016-04-19 MED ORDER — BETAMETHASONE SOD PHOS & ACET 6 (3-3) MG/ML IJ SUSP: 3.0000 mg | Freq: Once | INTRAMUSCULAR | Status: DC

## 2016-04-19 MED ORDER — MELOXICAM 15 MG PO TABS: 15.0000 mg | ORAL_TABLET | Freq: Every day | ORAL | 1 refills | Status: DC

## 2016-04-19 NOTE — Progress Notes (Signed)
   Subjective: Patient presents today for pain and tenderness in the right foot. Patient states the foot pain has been hurting for several weeks now. Patient states that it hurts in the mornings with the first steps out of bed. Patient presents today for further treatment and evaluation  Objective: Physical Exam General: The patient is alert and oriented x3 in no acute distress.  Dermatology: Skin is warm, dry and supple bilateral lower extremities. Negative for open lesions or macerations bilateral.   Vascular: Dorsalis Pedis and Posterior Tibial pulses palpable bilateral.  Capillary fill time is immediate to all digits.  Neurological: Epicritic and protective threshold intact bilateral.   Musculoskeletal: Tenderness to palpation at the medial calcaneal tubercale and through the insertion of the plantar fascia of the right foot. All other joints range of motion within normal limits bilateral. Strength 5/5 in all groups bilateral.   Radiographic exam: Normal osseous mineralization. Joint spaces preserved. No fracture/dislocation/boney destruction. Calcaneal spur present with mild thickening of plantar fascia right. No other soft tissue abnormalities or radiopaque foreign bodies.   Assessment: 1. Plantar fasciitis right 2. Pain in right foot  Plan of Care:  1. Patient evaluated. Xrays reviewed.   2. Injection of 0.5cc Celestone soluspan injected into the right heel at the insertion of the plantar fascia.  3. Instructed patient regarding therapies and modalities at home to alleviate symptoms.  4. Rx for Meloxicam 15mg  PO dispensed   5. Plantar fascial band(s) dispensed. 6. Return to clinic in 4 weeks.   7. Today the patient was scanned for custom molded orthotics, however we have not billed them out yet. Follow-up on next appointment  Patient works at [employer].   Edrick Kins, DPM [facility]  Dr. Edrick Kins, [place]                                         [place],  [address]                Office [phone]  Fax [phone]

## 2016-05-09 ENCOUNTER — Ambulatory Visit: Payer: Commercial Managed Care - PPO | Admitting: Podiatry

## 2016-06-06 ENCOUNTER — Ambulatory Visit (INDEPENDENT_AMBULATORY_CARE_PROVIDER_SITE_OTHER): Payer: Commercial Managed Care - PPO | Admitting: Podiatry

## 2016-06-06 DIAGNOSIS — M722 Plantar fascial fibromatosis: Secondary | ICD-10-CM | POA: Diagnosis not present

## 2016-06-06 MED ORDER — MELOXICAM 15 MG PO TABS: 15.0000 mg | ORAL_TABLET | Freq: Every day | ORAL | 1 refills | Status: DC

## 2016-06-08 NOTE — Progress Notes (Signed)
   Subjective: Patient presents today for follow up evaluation of plantar fasciitis of the right foot. She reports worsening pain of the right foot. She states she has been taking the Meloxicam as directed with moderate relief but she has now run out of the prescription. Patient presents today for further treatment and evaluation  Objective: Physical Exam General: The patient is alert and oriented x3 in no acute distress.  Dermatology: Skin is warm, dry and supple bilateral lower extremities. Negative for open lesions or macerations bilateral.   Vascular: Dorsalis Pedis and Posterior Tibial pulses palpable bilateral.  Capillary fill time is immediate to all digits.  Neurological: Epicritic and protective threshold intact bilateral.   Musculoskeletal: Tenderness to palpation at the medial calcaneal tubercale and through the insertion of the plantar fascia of the right foot. All other joints range of motion within normal limits bilateral. Strength 5/5 in all groups bilateral.    Assessment: 1. Plantar fasciitis right-recurrent 2. Pain in right foot  Plan of Care:  1. Patient evaluated. Xrays reviewed.   2. Injection of 0.5cc Celestone soluspan injected into the right heel at the insertion of the plantar fascia.  3. Instructed patient regarding therapies and modalities at home to alleviate symptoms.  4. Refilled prescription for Meloxicam 15mg  PO  5.Continue wearing orthotics dispensed in [place]. 6. Return to clinic in 8 weeks.  Patient works at [employer].   Edrick Kins, DPM [facility]  Dr. Edrick Kins, [place]                                        [place],  [address]                Office [phone]  Fax [phone]

## 2016-06-15 MED ORDER — BETAMETHASONE SOD PHOS & ACET 6 (3-3) MG/ML IJ SUSP: 3.0000 mg | Freq: Once | INTRAMUSCULAR | Status: DC

## 2016-07-25 ENCOUNTER — Ambulatory Visit (INDEPENDENT_AMBULATORY_CARE_PROVIDER_SITE_OTHER): Payer: Commercial Managed Care - PPO | Admitting: Unknown Physician Specialty

## 2016-07-25 ENCOUNTER — Encounter: Payer: Self-pay | Admitting: Unknown Physician Specialty

## 2016-07-25 VITALS — BP 128/78 | HR 70 | Temp 98.4°F | Ht 68.5 in | Wt 164.7 lb

## 2016-07-25 DIAGNOSIS — F324 Major depressive disorder, single episode, in partial remission: Secondary | ICD-10-CM | POA: Diagnosis not present

## 2016-07-25 DIAGNOSIS — Z0000 Encounter for general adult medical examination without abnormal findings: Secondary | ICD-10-CM | POA: Diagnosis not present

## 2016-07-25 NOTE — Progress Notes (Signed)
BP 128/78 (BP Location: Left Arm, Cuff Size: Normal)   Pulse 70   Temp 98.4 F (36.9 C)   Ht 5' 8.5" (1.74 m)   Wt 164 lb 11.2 oz (74.7 kg)   LMP  (LMP Unknown)   SpO2 98%   BMI 24.68 kg/m    Subjective:    Patient ID: Suzanne Scott, female    DOB: [birth year], 58 y.o.   MRN: [id number]  HPI: Suzanne Scott is a 56 y.o. female  Chief Complaint  Patient presents with  . Annual Exam   Depression Depression screen [facility] 2/9 02/09/2016 01/10/2016 12/13/2015 07/23/2015  Decreased Interest 0 0 0 1  Down, Depressed, Hopeless 0 0 2 0  PHQ - 2 Score 0 0 2 1  Altered sleeping 3 3 1  -  Tired, decreased energy 1 2 1  -  Change in appetite 1 3 3  -  Feeling bad or failure about yourself  0 0 0 -  Trouble concentrating 1 3 3  -  Moving slowly or fidgety/restless 0 0 3 -  Suicidal thoughts 0 0 0 -  PHQ-9 Score 6 11 13  -    Social History   Social History  . Marital status: Married    Spouse name: N/A  . Number of children: 3  . Years of education: 12   Occupational History  . Not on file.   Social History Main Topics  . Smoking status: Never Smoker  . Smokeless tobacco: Never Used  . Alcohol use 0.0 oz/week     Comment: pt states she drinks wine on occassion  . Drug use: No  . Sexual activity: Yes   Other Topics Concern  . Not on file   Social History Narrative  . No narrative on file   Family History  Problem Relation Age of Onset  . Arthritis Mother   . Heart disease Mother   . Breast cancer Neg Hx    Past Medical History:  Diagnosis Date  . Anxiety   . Atrophic vaginitis   . Hyperlipidemia    Past Surgical History:  Procedure Laterality Date  . BREAST BIOPSY Left 04/08/2015   path pending    Relevant past medical, surgical, family and social history reviewed and updated as indicated. Interim medical history since our last visit reviewed. Allergies and medications reviewed and updated.  Review of Systems  Per HPI unless specifically indicated  above     Objective:    BP 128/78 (BP Location: Left Arm, Cuff Size: Normal)   Pulse 70   Temp 98.4 F (36.9 C)   Ht 5' 8.5" (1.74 m)   Wt 164 lb 11.2 oz (74.7 kg)   LMP  (LMP Unknown)   SpO2 98%   BMI 24.68 kg/m   Wt Readings from Last 3 Encounters:  07/25/16 164 lb 11.2 oz (74.7 kg)  02/09/16 160 lb (72.6 kg)  01/10/16 156 lb 6.4 oz (70.9 kg)    Physical Exam  Constitutional: She is oriented to person, place, and time. She appears well-developed and well-nourished.  HENT:  Head: Normocephalic and atraumatic.  Eyes: Pupils are equal, round, and reactive to light. Right eye exhibits no discharge. Left eye exhibits no discharge. No scleral icterus.  Neck: Normal range of motion. Neck supple. Carotid bruit is not present. No thyromegaly present.  Cardiovascular: Normal rate, regular rhythm and normal heart sounds.  Exam reveals no gallop and no friction rub.   No murmur heard. Pulmonary/Chest: Effort normal and breath sounds  normal. No respiratory distress. She has no wheezes. She has no rales.  Abdominal: Soft. Bowel sounds are normal. There is no tenderness. There is no rebound.  Genitourinary: No breast swelling, tenderness or discharge.  Musculoskeletal: Normal range of motion.  Lymphadenopathy:    She has no cervical adenopathy.  Neurological: She is alert and oriented to person, place, and time.  Skin: Skin is warm, dry and intact. No rash noted.  Psychiatric: She has a normal mood and affect. Her speech is normal and behavior is normal. Judgment and thought content normal. Cognition and memory are normal.    Results for orders placed or performed in visit on 10/15/15  Microscopic Examination  Result Value Ref Range   WBC, UA 0-5 0 - 5 /hpf   RBC, UA 3-10 (A) 0 - 2 /hpf   Epithelial Cells (non renal) 0-10 0 - 10 /hpf   Bacteria, UA Few None seen/Few  UA/M w/rflx Culture, Routine  Result Value Ref Range   Specific Gravity, UA 1.020 1.005 - 1.030   pH, UA 6.0 5.0 -  7.5   Color, UA Yellow Yellow   Appearance Ur Clear Clear   Leukocytes, UA Negative Negative   Protein, UA Negative Negative/Trace   Glucose, UA Negative Negative   Ketones, UA Negative Negative   RBC, UA 1+ (A) Negative   Bilirubin, UA Negative Negative   Urobilinogen, Ur 0.2 0.2 - 1.0 mg/dL   Nitrite, UA Negative Negative   Microscopic Examination See below:       Assessment & Plan:   Problem List Items Addressed This Visit      Unprioritized   Depression    Stable, continue present medications.         Other Visit Diagnoses    Routine general medical examination at a health care facility    -  Primary   Relevant Orders   CBC with Differential/Platelet   Comprehensive metabolic panel   Lipid Panel w/o Chol/HDL Ratio   TSH   VITAMIN D 25 Hydroxy (Vit-D Deficiency, Fractures)       Follow up plan: Return in about 6 months (around 01/24/2017).

## 2016-07-25 NOTE — Assessment & Plan Note (Signed)
Stable, continue present medications.   

## 2016-07-26 ENCOUNTER — Encounter: Payer: Self-pay | Admitting: Unknown Physician Specialty

## 2016-07-26 LAB — CBC WITH DIFFERENTIAL/PLATELET
BASOS ABS: 0 10*3/uL (ref 0.0–0.2)
Basos: 0 %
EOS (ABSOLUTE): 0.1 10*3/uL (ref 0.0–0.4)
Eos: 2 %
HEMOGLOBIN: 12.6 g/dL (ref 11.1–15.9)
Hematocrit: 38.7 % (ref 34.0–46.6)
IMMATURE GRANS (ABS): 0 10*3/uL (ref 0.0–0.1)
Immature Granulocytes: 0 %
LYMPHS: 27 %
Lymphocytes Absolute: 1.9 10*3/uL (ref 0.7–3.1)
MCH: 27.7 pg (ref 26.6–33.0)
MCHC: 32.6 g/dL (ref 31.5–35.7)
MCV: 85 fL (ref 79–97)
MONOCYTES: 12 %
Monocytes Absolute: 0.8 10*3/uL (ref 0.1–0.9)
Neutrophils Absolute: 4.1 10*3/uL (ref 1.4–7.0)
Neutrophils: 59 %
Platelets: 250 10*3/uL (ref 150–379)
RBC: 4.55 x10E6/uL (ref 3.77–5.28)
RDW: 15.9 % — ABNORMAL HIGH (ref 12.3–15.4)
WBC: 6.9 10*3/uL (ref 3.4–10.8)

## 2016-07-26 LAB — COMPREHENSIVE METABOLIC PANEL
ALBUMIN: 4.5 g/dL (ref 3.5–5.5)
ALK PHOS: 72 IU/L (ref 39–117)
ALT: 24 IU/L (ref 0–32)
AST: 22 IU/L (ref 0–40)
Albumin/Globulin Ratio: 1.7 (ref 1.2–2.2)
BUN / CREAT RATIO: 18 (ref 9–23)
BUN: 14 mg/dL (ref 6–24)
Bilirubin Total: 0.4 mg/dL (ref 0.0–1.2)
CO2: 26 mmol/L (ref 20–29)
CREATININE: 0.78 mg/dL (ref 0.57–1.00)
Calcium: 9.3 mg/dL (ref 8.7–10.2)
Chloride: 101 mmol/L (ref 96–106)
GFR calc Af Amer: 98 mL/min/{1.73_m2} (ref >59)
GFR calc non Af Amer: 85 mL/min/{1.73_m2} (ref >59)
GLUCOSE: 83 mg/dL (ref 65–99)
Globulin, Total: 2.7 g/dL (ref 1.5–4.5)
Potassium: 3.9 mmol/L (ref 3.5–5.2)
Sodium: 140 mmol/L (ref 134–144)
TOTAL PROTEIN: 7.2 g/dL (ref 6.0–8.5)

## 2016-07-26 LAB — VITAMIN D 25 HYDROXY (VIT D DEFICIENCY, FRACTURES): Vit D, 25-Hydroxy: 26.4 ng/mL — ABNORMAL LOW (ref 30.0–100.0)

## 2016-07-26 LAB — TSH: TSH: 0.834 u[IU]/mL (ref 0.450–4.500)

## 2016-07-26 LAB — LIPID PANEL W/O CHOL/HDL RATIO
CHOLESTEROL TOTAL: 205 mg/dL — AB (ref 100–199)
HDL: 51 mg/dL (ref >39)
LDL CALC: 130 mg/dL — AB (ref 0–99)
Triglycerides: 121 mg/dL (ref 0–149)
VLDL CHOLESTEROL CAL: 24 mg/dL (ref 5–40)

## 2016-08-08 ENCOUNTER — Ambulatory Visit (INDEPENDENT_AMBULATORY_CARE_PROVIDER_SITE_OTHER): Payer: Commercial Managed Care - PPO | Admitting: Podiatry

## 2016-08-08 ENCOUNTER — Encounter: Payer: Self-pay | Admitting: Podiatry

## 2016-08-08 DIAGNOSIS — M722 Plantar fascial fibromatosis: Secondary | ICD-10-CM | POA: Diagnosis not present

## 2016-08-26 MED ORDER — BETAMETHASONE SOD PHOS & ACET 6 (3-3) MG/ML IJ SUSP: 3.0000 mg | Freq: Once | INTRAMUSCULAR | Status: DC

## 2016-08-26 NOTE — Progress Notes (Signed)
   Subjective: Patient presents today for follow-up treatment and evaluation of plantar fasciitis to the right foot. Patient states that the injections helped for a couple weeks as well as the plantar fascial brace. She does not believe that the meloxicam provides any sort of alleviating symptoms. Patient was last seen 06/06/2016.  Objective: Physical Exam General: The patient is alert and oriented x3 in no acute distress.  Dermatology: Skin is warm, dry and supple bilateral lower extremities. Negative for open lesions or macerations bilateral.   Vascular: Dorsalis Pedis and Posterior Tibial pulses palpable bilateral.  Capillary fill time is immediate to all digits.  Neurological: Epicritic and protective threshold intact bilateral.   Musculoskeletal: Tenderness to palpation at the medial calcaneal tubercale and through the insertion of the plantar fascia of the right foot. All other joints range of motion within normal limits bilateral. Strength 5/5 in all groups bilateral.   Assessment: 1. Plantar fasciitis right 2. Pain in right foot  Plan of Care:  1. Patient evaluated. Xrays reviewed.   2. Injection of 0.5cc Celestone soluspan injected into the right plantar fascia  3. Today prescription for physical therapy was provided. The patient is going to have physical therapy at [facility]. 4. Continue orthotics plantar fascial brace in good supportive sneakers 5. Return to clinic in 4 weeks just before she leaves on a cruise August 27  Patient works at [employer]. Patient going on a cruise August 27      Edrick Kins, DPM [facility]  Dr. Edrick Kins, DPM    [address],  [address]                Office [phone]  Fax [phone]

## 2016-09-15 ENCOUNTER — Ambulatory Visit: Payer: Commercial Managed Care - PPO | Admitting: Podiatry

## 2016-09-21 ENCOUNTER — Other Ambulatory Visit: Payer: Self-pay | Admitting: Unknown Physician Specialty

## 2016-10-03 ENCOUNTER — Ambulatory Visit (INDEPENDENT_AMBULATORY_CARE_PROVIDER_SITE_OTHER): Payer: Commercial Managed Care - PPO | Admitting: Podiatry

## 2016-10-03 ENCOUNTER — Encounter: Payer: Self-pay | Admitting: Podiatry

## 2016-10-03 DIAGNOSIS — M722 Plantar fascial fibromatosis: Secondary | ICD-10-CM | POA: Diagnosis not present

## 2016-10-06 NOTE — Progress Notes (Signed)
   Subjective: Patient presents today for follow-up treatment and evaluation of plantar fasciitis to the right foot. She states she is doing better. Taking Meloxicam and wearing the braces help alleviate the pain. She denies any new complaints at this time. She is here for further evaluation and treatment.   Objective: Physical Exam General: The patient is alert and oriented x3 in no acute distress.  Dermatology: Skin is warm, dry and supple bilateral lower extremities. Negative for open lesions or macerations bilateral.   Vascular: Dorsalis Pedis and Posterior Tibial pulses palpable bilateral.  Capillary fill time is immediate to all digits.  Neurological: Epicritic and protective threshold intact bilateral.   Musculoskeletal: Tenderness to palpation at the medial calcaneal tubercale and through the insertion of the plantar fascia of the right foot. All other joints range of motion within normal limits bilateral. Strength 5/5 in all groups bilateral.   Assessment: 1. Plantar fasciitis right 2. Pain in right foot  Plan of Care:  1. Patient evaluated. 2. Injection of 0.5cc Celestone soluspan injected into the right plantar fascia  3. Continue taking Meloxicam, wearing brace and good shoe gear. 4. Discussed EPAT and physical therapy. 5. Return to clinic in 4 weeks.      Edrick Kins, DPM [facility]  Dr. Edrick Kins, DPM    [address], [place] [address]                Office [phone]  Fax [phone]

## 2016-10-09 ENCOUNTER — Ambulatory Visit: Payer: Commercial Managed Care - PPO | Admitting: Unknown Physician Specialty

## 2016-10-09 ENCOUNTER — Telehealth: Payer: Self-pay | Admitting: Unknown Physician Specialty

## 2016-10-09 MED ORDER — BETAMETHASONE SOD PHOS & ACET 6 (3-3) MG/ML IJ SUSP: 3.0000 mg | Freq: Once | INTRAMUSCULAR | Status: DC

## 2016-10-09 NOTE — Telephone Encounter (Signed)
Patient will stop by with the paperwork this afternoon.

## 2016-10-09 NOTE — Telephone Encounter (Signed)
Please have her bring the paperwork with her today. I can look at it and if she needs seen then we will see her at the scheduled time. I need to see the paperwork to determine if an appointment is needed or not.

## 2016-10-09 NOTE — Telephone Encounter (Signed)
Patient has some paperwork for Suzanne Scott to fill out. She needed an appointment today or tomorrow.  I have put her in for 3:15 today but if she does not need to come in just let me know and I will call her back to have her drop it off. It was for her work showing she had her physical.  Please advise.  Thanks

## 2016-10-21 ENCOUNTER — Other Ambulatory Visit: Payer: Self-pay | Admitting: Unknown Physician Specialty

## 2016-10-22 NOTE — Telephone Encounter (Signed)
rx

## 2016-10-23 ENCOUNTER — Telehealth: Payer: Self-pay | Admitting: Unknown Physician Specialty

## 2016-10-23 DIAGNOSIS — Z9889 Other specified postprocedural states: Secondary | ICD-10-CM

## 2016-10-23 NOTE — Telephone Encounter (Signed)
We do not do the skin TB tests here, we can check for TB through a blood draw. Malachy Mood- can you look at the patient's mammogram results under imaging please? I am not clear as to when the patient is due again.

## 2016-10-23 NOTE — Telephone Encounter (Signed)
It is due now

## 2016-10-23 NOTE — Telephone Encounter (Signed)
Called and spoke with Melissa at [facility] to see what orders the patient was needing. Melissa stated just a screening mammogram. Will fix order in the chart and call patient to let her know about this and provide her [facility]'s phone number.

## 2016-10-23 NOTE — Telephone Encounter (Signed)
Patient would like to have a skin TB test done can this be done here? Also wants to know when her next mammogram needs to be scheduled  Thanks  [phone]

## 2016-10-23 NOTE — Telephone Encounter (Signed)
She would like to know where she is supposed to call to schedule her mammogram as she states before there was an issue and she had to have a more detailed scan.  Please advise.  Thank You

## 2016-10-24 ENCOUNTER — Other Ambulatory Visit: Payer: Self-pay

## 2016-10-24 DIAGNOSIS — Z1239 Encounter for other screening for malignant neoplasm of breast: Secondary | ICD-10-CM

## 2016-10-24 NOTE — Telephone Encounter (Signed)
Tried calling patient again, still got a busy signal. Will try to call again later.

## 2016-10-24 NOTE — Telephone Encounter (Signed)
Tried calling patient twice, got a busy signal both times. Will try to call again later.

## 2016-10-24 NOTE — Telephone Encounter (Signed)
Called and let patient know about mammogram. Also provided [facility]'s number [phone]), to the patient. Asked patient about TB screening and she states that she is going to call us back and let us know if wants to have the blood draw done.

## 2016-10-25 ENCOUNTER — Telehealth: Payer: Self-pay | Admitting: Unknown Physician Specialty

## 2016-10-25 DIAGNOSIS — Z111 Encounter for screening for respiratory tuberculosis: Secondary | ICD-10-CM

## 2016-10-25 NOTE — Telephone Encounter (Signed)
Patient would like to have a TB test done. I have put her on the schedule for 10/01 @ 3:30 if Malachy Mood would put the order in.  Thanks

## 2016-10-25 NOTE — Telephone Encounter (Signed)
Routing to [facility] for order.

## 2016-10-30 ENCOUNTER — Other Ambulatory Visit: Payer: Commercial Managed Care - PPO

## 2016-10-30 DIAGNOSIS — Z111 Encounter for screening for respiratory tuberculosis: Secondary | ICD-10-CM

## 2016-11-02 ENCOUNTER — Ambulatory Visit
Admission: RE | Admit: 2016-11-02 | Discharge: 2016-11-02 | Disposition: A | Discharge status: Home / Self Care | Payer: Commercial Managed Care - PPO | Source: Ambulatory Visit | Attending: Unknown Physician Specialty | Admitting: Unknown Physician Specialty

## 2016-11-02 DIAGNOSIS — Z1239 Encounter for other screening for malignant neoplasm of breast: Secondary | ICD-10-CM

## 2016-11-02 DIAGNOSIS — Z1231 Encounter for screening mammogram for malignant neoplasm of breast: Secondary | ICD-10-CM | POA: Insufficient documentation

## 2016-11-06 LAB — QUANTIFERON-TB GOLD PLUS
QUANTIFERON NIL VALUE: 0.06 [IU]/mL
QuantiFERON TB1 Ag Value: 0.04 IU/mL
QuantiFERON TB2 Ag Value: 0.04 IU/mL
QuantiFERON-TB Gold Plus: NEGATIVE

## 2016-11-14 ENCOUNTER — Ambulatory Visit (INDEPENDENT_AMBULATORY_CARE_PROVIDER_SITE_OTHER): Payer: Commercial Managed Care - PPO

## 2016-11-14 ENCOUNTER — Ambulatory Visit (INDEPENDENT_AMBULATORY_CARE_PROVIDER_SITE_OTHER): Payer: Commercial Managed Care - PPO | Admitting: Podiatry

## 2016-11-14 DIAGNOSIS — M722 Plantar fascial fibromatosis: Secondary | ICD-10-CM

## 2016-11-16 NOTE — Progress Notes (Signed)
   Subjective: Patient presents today for follow-up treatment and evaluation of plantar fasciitis to the right foot. She reports the right heel pain has significantly improved. She reports numbness to the lateral right foot the day after receiving the injection.  She also has a new complaint of intermittent pain to the right forefoot that began 6 weeks ago. There are no modifying factors noted. She has not done anything to treat the symptoms. She is here for further evaluation and treatment.    Past Medical History:  Diagnosis Date  . Anxiety   . Atrophic vaginitis   . Hyperlipidemia     Objective: Physical Exam General: The patient is alert and oriented x3 in no acute distress.  Dermatology: Paresthesia right lateral forefoot with light touch. Skin is warm, dry and supple bilateral lower extremities. Negative for open lesions or macerations bilateral.   Vascular: Dorsalis Pedis and Posterior Tibial pulses palpable bilateral.  Capillary fill time is immediate to all digits.  Neurological: Epicritic and protective threshold intact bilateral.   Musculoskeletal: Tenderness to palpation at the medial calcaneal tubercale and through the insertion of the plantar fascia of the right foot. All other joints range of motion within normal limits bilateral. Strength 5/5 in all groups bilateral.   Radiographic Exam:  Normal osseous mineralization. Joint spaces preserved. No fracture/dislocation/boney destruction.   Assessment: 1. Plantar fasciitis right 2. Neuritis/numbness right forefoot  Plan of Care:  1. Patient evaluated. 2. Appt with Liliane Channel for custom molded orthotics. 3. Continue taking Meloxicam, wearing plantar fascial brace and wearing good shoe gear. 4. Return to clinic when necessary.      Edrick Kins, DPM [facility]  Dr. Edrick Kins, DPM    [address],  [address]                Office [phone]  Fax [phone]

## 2016-12-04 ENCOUNTER — Other Ambulatory Visit: Payer: Self-pay | Admitting: Unknown Physician Specialty

## 2016-12-06 ENCOUNTER — Ambulatory Visit: Payer: Commercial Managed Care - PPO | Admitting: Orthotics

## 2016-12-06 DIAGNOSIS — M722 Plantar fascial fibromatosis: Secondary | ICD-10-CM

## 2016-12-06 DIAGNOSIS — M79671 Pain in right foot: Secondary | ICD-10-CM

## 2016-12-06 NOTE — Progress Notes (Signed)

## 2017-01-02 ENCOUNTER — Encounter (INDEPENDENT_AMBULATORY_CARE_PROVIDER_SITE_OTHER): Payer: Commercial Managed Care - PPO | Admitting: Orthotics

## 2017-01-02 DIAGNOSIS — M79673 Pain in unspecified foot: Secondary | ICD-10-CM

## 2017-01-11 ENCOUNTER — Other Ambulatory Visit: Payer: Self-pay | Admitting: Unknown Physician Specialty

## 2017-01-17 ENCOUNTER — Ambulatory Visit (INDEPENDENT_AMBULATORY_CARE_PROVIDER_SITE_OTHER): Payer: Commercial Managed Care - PPO | Admitting: Unknown Physician Specialty

## 2017-01-17 ENCOUNTER — Encounter: Payer: Self-pay | Admitting: Unknown Physician Specialty

## 2017-01-17 VITALS — BP 122/75 | HR 72 | Temp 98.5°F | Wt 169.8 lb

## 2017-01-17 DIAGNOSIS — R042 Hemoptysis: Secondary | ICD-10-CM | POA: Diagnosis not present

## 2017-01-17 DIAGNOSIS — J040 Acute laryngitis: Secondary | ICD-10-CM

## 2017-01-17 DIAGNOSIS — R0989 Other specified symptoms and signs involving the circulatory and respiratory systems: Secondary | ICD-10-CM

## 2017-01-17 MED ORDER — PREDNISONE 20 MG PO TABS: 40.0000 mg | ORAL_TABLET | Freq: Every day | ORAL | 0 refills | Status: DC

## 2017-01-17 NOTE — Progress Notes (Signed)
BP 122/75 (BP Location: Left Arm, Cuff Size: Normal)   Pulse 72   Temp 98.5 F (36.9 C) (Oral)   Wt 169 lb 12.8 oz (77 kg)   LMP  (LMP Unknown)   SpO2 97%   BMI 25.44 kg/m    Subjective:    Patient ID: Suzanne Scott, female    DOB: Jan 30, 1961, 56 y.o.   MRN: [id number]  HPI: Suzanne Scott is a 56 y.o. female  Chief Complaint  Patient presents with  . Laryngitis    pt states this started Monday for her and has some chest congestion. Patient states she needs a note for work as well, is hoping to return tomorrow   URI   This is a new problem. Episode onset: laryngitis since yesterday. The problem has been unchanged. There has been no fever. Associated symptoms include congestion. Pertinent negatives include no abdominal pain, chest pain, coughing, diarrhea, dysuria, ear pain, headaches, joint pain, joint swelling, nausea, neck pain, plugged ear sensation, rash, rhinorrhea, sinus pain, sneezing, sore throat, swollen glands, vomiting or wheezing. Associated symptoms comments: States what concerns her that for months she has had phlegm in her throat with blood.  2 weeks ago noticed more blood for one day.  States it is only on left side where she feels phlegm. Treatments tried: mucinex. The treatment provided no relief.     Relevant past medical, surgical, family and social history reviewed and updated as indicated. Interim medical history since our last visit reviewed. Allergies and medications reviewed and updated.  Review of Systems  HENT: Positive for congestion. Negative for ear pain, rhinorrhea, sinus pain, sneezing and sore throat.   Respiratory: Negative for cough and wheezing.   Cardiovascular: Negative for chest pain.  Gastrointestinal: Negative for abdominal pain, diarrhea, nausea and vomiting.  Genitourinary: Negative for dysuria.  Musculoskeletal: Negative for joint pain and neck pain.  Skin: Negative for rash.  Neurological: Negative for headaches.    Per HPI  unless specifically indicated above     Objective:    BP 122/75 (BP Location: Left Arm, Cuff Size: Normal)   Pulse 72   Temp 98.5 F (36.9 C) (Oral)   Wt 169 lb 12.8 oz (77 kg)   LMP  (LMP Unknown)   SpO2 97%   BMI 25.44 kg/m   Wt Readings from Last 3 Encounters:  01/17/17 169 lb 12.8 oz (77 kg)  07/25/16 164 lb 11.2 oz (74.7 kg)  02/09/16 160 lb (72.6 kg)    Physical Exam  Constitutional: She is oriented to person, place, and time. She appears well-developed and well-nourished. No distress.  HENT:  Head: Normocephalic and atraumatic.  Right Ear: Tympanic membrane and ear canal normal.  Left Ear: Tympanic membrane and ear canal normal.  Nose: Rhinorrhea present. Right sinus exhibits no maxillary sinus tenderness and no frontal sinus tenderness. Left sinus exhibits no maxillary sinus tenderness and no frontal sinus tenderness.  Mouth/Throat: Mucous membranes are normal. Posterior oropharyngeal erythema present.  Eyes: Conjunctivae and lids are normal. Right eye exhibits no discharge. Left eye exhibits no discharge. No scleral icterus.  Cardiovascular: Normal rate and regular rhythm.  Pulmonary/Chest: Effort normal and breath sounds normal. No respiratory distress.  Abdominal: Normal appearance. There is no splenomegaly or hepatomegaly.  Musculoskeletal: Normal range of motion.  Neurological: She is alert and oriented to person, place, and time.  Skin: Skin is intact. No rash noted. No pallor.  Psychiatric: She has a normal mood and affect. Her behavior is  normal. Judgment and thought content normal.    Results for orders placed or performed in visit on 10/30/16  QuantiFERON-TB Gold Plus  Result Value Ref Range   QuantiFERON Incubation Comment    QuantiFERON Criteria Comment    QuantiFERON TB1 Ag Value 0.04 IU/mL   QuantiFERON TB2 Ag Value 0.04 IU/mL   QuantiFERON Nil Value 0.06 IU/mL   QuantiFERON Mitogen Value >10.00 IU/mL   QuantiFERON-TB Gold Plus Negative Negative       Assessment & Plan:   Problem List Items Addressed This Visit    None    Visit Diagnoses    Laryngitis    -  Primary   Explained most likely viral.  Rx for prednisone.  Explained inflamatory process   Relevant Orders   Ambulatory referral to ENT   Phlegm in throat       Most likely viral, but due to persistance blood in sputum, will refer to ENT for further evaluation.  Rx for Flonase.     Relevant Orders   Ambulatory referral to ENT   Blood in sputum       Relevant Orders   Ambulatory referral to ENT       Follow up plan: Return if symptoms worsen or fail to improve.

## 2017-01-26 ENCOUNTER — Encounter: Payer: Self-pay | Admitting: Unknown Physician Specialty

## 2017-01-26 ENCOUNTER — Ambulatory Visit (INDEPENDENT_AMBULATORY_CARE_PROVIDER_SITE_OTHER): Payer: Commercial Managed Care - PPO | Admitting: Unknown Physician Specialty

## 2017-01-26 VITALS — BP 137/81 | HR 64 | Temp 98.2°F | Wt 172.6 lb

## 2017-01-26 DIAGNOSIS — G4485 Primary stabbing headache: Secondary | ICD-10-CM | POA: Insufficient documentation

## 2017-01-26 DIAGNOSIS — F324 Major depressive disorder, single episode, in partial remission: Secondary | ICD-10-CM | POA: Diagnosis not present

## 2017-01-26 DIAGNOSIS — J040 Acute laryngitis: Secondary | ICD-10-CM | POA: Diagnosis not present

## 2017-01-26 NOTE — Assessment & Plan Note (Signed)
Stable, continue present medications.   

## 2017-01-26 NOTE — Progress Notes (Signed)
BP 137/81   Pulse 64   Temp 98.2 F (36.8 C) (Oral)   Wt 172 lb 9.6 oz (78.3 kg)   LMP  (LMP Unknown)   SpO2 100%   BMI 25.86 kg/m    Subjective:    Patient ID: Suzanne Scott, female    DOB: [birth year], 58 y.o.   MRN: [id number]  HPI: Suzanne Scott is a 56 y.o. female  Chief Complaint  Patient presents with  . Depression    6 month f/up    Depression F/u of depression.  Taking Citalopram.  Takes it daily "it works for me." Depression screen [facility] 2/9 01/26/2017 02/09/2016 01/10/2016 12/13/2015 07/23/2015  Decreased Interest 1 0 0 0 1  Down, Depressed, Hopeless 1 0 0 2 0  PHQ - 2 Score 2 0 0 2 1  Altered sleeping 1 3 3 1  -  Tired, decreased energy 1 1 2 1  -  Change in appetite 0 1 3 3  -  Feeling bad or failure about yourself  0 0 0 0 -  Trouble concentrating 3 1 3 3  -  Moving slowly or fidgety/restless 0 0 0 3 -  Suicidal thoughts 0 0 0 0 -  PHQ-9 Score 7 6 11 13  -     Relevant past medical, surgical, family and social history reviewed and updated as indicated. Interim medical history since our last visit reviewed. Allergies and medications reviewed and updated.  Review of Systems  Constitutional: Negative.   HENT:       Laryngitis better.  Brought up a lot of phlegm and wondering if normall  Respiratory: Negative.   Cardiovascular: Negative.   Musculoskeletal: Negative.   Neurological:       Having sudden sharp jabs of head pain on left side.  Comes and goes  Psychiatric/Behavioral: Negative.     Per HPI unless specifically indicated above     Objective:    BP 137/81   Pulse 64   Temp 98.2 F (36.8 C) (Oral)   Wt 172 lb 9.6 oz (78.3 kg)   LMP  (LMP Unknown)   SpO2 100%   BMI 25.86 kg/m   Wt Readings from Last 3 Encounters:  01/26/17 172 lb 9.6 oz (78.3 kg)  01/17/17 169 lb 12.8 oz (77 kg)  07/25/16 164 lb 11.2 oz (74.7 kg)    Physical Exam  Constitutional: She is oriented to person, place, and time. She appears well-developed and  well-nourished. No distress.  HENT:  Head: Normocephalic and atraumatic.  Eyes: Conjunctivae and lids are normal. Right eye exhibits no discharge. Left eye exhibits no discharge. No scleral icterus.  Neck: Normal range of motion. Neck supple. No JVD present. Carotid bruit is not present.  Cardiovascular: Normal rate, regular rhythm and normal heart sounds.  Pulmonary/Chest: Effort normal and breath sounds normal.  Abdominal: Normal appearance. There is no splenomegaly or hepatomegaly.  Musculoskeletal: Normal range of motion.  Neurological: She is alert and oriented to person, place, and time.  Skin: Skin is warm, dry and intact. No rash noted. No pallor.  Psychiatric: She has a normal mood and affect. Her behavior is normal. Judgment and thought content normal.    Results for orders placed or performed in visit on 10/30/16  QuantiFERON-TB Gold Plus  Result Value Ref Range   QuantiFERON Incubation Comment    QuantiFERON Criteria Comment    QuantiFERON TB1 Ag Value 0.04 IU/mL   QuantiFERON TB2 Ag Value 0.04 IU/mL   QuantiFERON Nil Value 0.06  IU/mL   QuantiFERON Mitogen Value >10.00 IU/mL   QuantiFERON-TB Gold Plus Negative Negative      Assessment & Plan:   Problem List Items Addressed This Visit      Unprioritized   Depression    Stable, continue present medications.        Stabbing headache    Pt ed on massage.  Reassurance       Other Visit Diagnoses    Laryngitis    -  Primary   Improving.         Follow up plan: Return in about 6 months (around 07/27/2017) for physical.

## 2017-01-26 NOTE — Assessment & Plan Note (Signed)
Pt ed on massage.  Reassurance

## 2017-02-12 ENCOUNTER — Other Ambulatory Visit: Payer: Self-pay | Admitting: Unknown Physician Specialty

## 2017-04-13 ENCOUNTER — Encounter: Payer: Self-pay | Admitting: Podiatry

## 2017-04-13 ENCOUNTER — Ambulatory Visit (INDEPENDENT_AMBULATORY_CARE_PROVIDER_SITE_OTHER): Payer: Commercial Managed Care - PPO | Admitting: Podiatry

## 2017-04-13 DIAGNOSIS — M7751 Other enthesopathy of right foot: Secondary | ICD-10-CM | POA: Diagnosis not present

## 2017-04-13 DIAGNOSIS — M778 Other enthesopathies, not elsewhere classified: Secondary | ICD-10-CM

## 2017-04-13 DIAGNOSIS — M779 Enthesopathy, unspecified: Principal | ICD-10-CM

## 2017-04-13 NOTE — Progress Notes (Signed)
   HPI: 57 year old female presenting today with a chief complaint of sharp pain across the dorsum of the right foot that began about two weeks ago after twisting the foot. She reports associated numbness in toes 2-5 of the foot. She also reports corns on all of the toes of the right foot. She was seen at a walk-in clinic and was prescribed Naproxen which she has been taking as directed. Walking increases the pain. Patient is here for further evaluation and treatment.   Past Medical History:  Diagnosis Date  . Anxiety   . Atrophic vaginitis   . Hyperlipidemia      Physical Exam: General: The patient is alert and oriented x3 in no acute distress.  Dermatology: Skin is warm, dry and supple bilateral lower extremities. Negative for open lesions or macerations.  Vascular: Palpable pedal pulses bilaterally. No edema or erythema noted. Capillary refill within normal limits.  Neurological: Epicritic and protective threshold grossly intact bilaterally.   Musculoskeletal Exam: Pain with palpation to the right forefoot. Range of motion within normal limits to all pedal and ankle joints bilateral. Muscle strength 5/5 in all groups bilateral.   Assessment: - right foot capsulitis    Plan of Care:  - Patient evaluated.  - Injection of 0.5 mLs Celestone Soluspan injected into the right midfoot.  - Continue taking Naproxen as prescribed from walk-in clinic.  - Continue wearing custom molded orthotics.  - Return to clinic in 4 weeks.   Works at [employer].    Edrick Kins, DPM [facility]  Dr. Edrick Kins, DPM    [address],  [address]                Office [phone]  Fax [phone]

## 2017-05-11 ENCOUNTER — Ambulatory Visit: Payer: Commercial Managed Care - PPO | Admitting: Podiatry

## 2017-05-14 ENCOUNTER — Other Ambulatory Visit: Payer: Self-pay | Admitting: Unknown Physician Specialty

## 2017-05-14 ENCOUNTER — Encounter: Payer: Self-pay | Admitting: Unknown Physician Specialty

## 2017-05-14 ENCOUNTER — Ambulatory Visit
Admission: RE | Admit: 2017-05-14 | Discharge: 2017-05-14 | Disposition: A | Discharge status: Home / Self Care | Payer: Commercial Managed Care - PPO | Source: Ambulatory Visit | Attending: Unknown Physician Specialty | Admitting: Unknown Physician Specialty

## 2017-05-14 ENCOUNTER — Ambulatory Visit (INDEPENDENT_AMBULATORY_CARE_PROVIDER_SITE_OTHER): Payer: Commercial Managed Care - PPO | Admitting: Unknown Physician Specialty

## 2017-05-14 VITALS — BP 119/69 | HR 85 | Temp 99.6°F | Ht 68.5 in | Wt 170.0 lb

## 2017-05-14 DIAGNOSIS — J181 Lobar pneumonia, unspecified organism: Secondary | ICD-10-CM | POA: Insufficient documentation

## 2017-05-14 DIAGNOSIS — R059 Cough, unspecified: Secondary | ICD-10-CM

## 2017-05-14 DIAGNOSIS — R05 Cough: Secondary | ICD-10-CM

## 2017-05-14 DIAGNOSIS — R509 Fever, unspecified: Secondary | ICD-10-CM

## 2017-05-14 MED ORDER — DOXYCYCLINE HYCLATE 100 MG PO TABS
100.0000 mg | ORAL_TABLET | Freq: Two times a day (BID) | ORAL | 0 refills | Status: DC
Start: 2017-05-14 — End: 2017-07-31

## 2017-05-14 MED ORDER — HYDROCOD POLST-CPM POLST ER 10-8 MG/5ML PO SUER: 5.0000 mL | Freq: Two times a day (BID) | ORAL | 0 refills | Status: DC | PRN

## 2017-05-14 NOTE — Progress Notes (Signed)
Added Doxycycline for pneumonia

## 2017-05-14 NOTE — Addendum Note (Signed)
Addended by: Kathrine Haddock on: 05/14/2017 09:46 AM   Modules accepted: Orders

## 2017-05-14 NOTE — Progress Notes (Signed)
BP 119/69   Pulse 85   Temp 99.6 F (37.6 C) (Oral)   Ht 5' 8.5" (1.74 m)   Wt 170 lb (77.1 kg)   LMP  (LMP Unknown)   SpO2 98%   BMI 25.47 kg/m    Subjective:    Patient ID: Suzanne Scott, female    DOB: [birth year], 57 y.o.   MRN: [id number]  HPI: Suzanne Scott is a 57 y.o. female  Chief Complaint  Patient presents with  . Bronchitis    pt states she has had a cough and chest congestion since last week. Denies any nasal congestion or pressure, states it is all in her chest. Patient states she called the [facility] and was prescribed an inhaler and amoxicillin for bronchitis   Cough  This is a new problem. Episode onset: 1 week.  Received Amoxil and inhaler 3 days ago. Progression since onset: Is a little better  The cough is productive of sputum. Associated symptoms include chest pain, a fever, headaches, myalgias and shortness of breath. Pertinent negatives include no chills, ear congestion, ear pain, heartburn, hemoptysis, nasal congestion, postnasal drip, rash, rhinorrhea, sore throat, sweats, weight loss or wheezing. Associated symptoms comments: Chest pain during cough. Fever resolved since Friday.  Temperature 102.5 . Nothing aggravates the symptoms. Treatments tried: As above. The treatment provided mild relief.    Relevant past medical, surgical, family and social history reviewed and updated as indicated. Interim medical history since our last visit reviewed. Allergies and medications reviewed and updated.  Review of Systems  Constitutional: Positive for fever. Negative for chills and weight loss.  HENT: Negative for ear pain, postnasal drip, rhinorrhea and sore throat.   Respiratory: Positive for cough and shortness of breath. Negative for hemoptysis and wheezing.   Cardiovascular: Positive for chest pain.  Gastrointestinal: Negative for heartburn.  Musculoskeletal: Positive for myalgias.  Skin: Negative for rash.  Neurological: Positive for headaches.    Per  HPI unless specifically indicated above     Objective:    BP 119/69   Pulse 85   Temp 99.6 F (37.6 C) (Oral)   Ht 5' 8.5" (1.74 m)   Wt 170 lb (77.1 kg)   LMP  (LMP Unknown)   SpO2 98%   BMI 25.47 kg/m   Wt Readings from Last 3 Encounters:  05/14/17 170 lb (77.1 kg)  01/26/17 172 lb 9.6 oz (78.3 kg)  01/17/17 169 lb 12.8 oz (77 kg)    Physical Exam  Constitutional: She is oriented to person, place, and time. She appears well-developed and well-nourished.  HENT:  Head: Normocephalic and atraumatic.  Eyes: Pupils are equal, round, and reactive to light. Right eye exhibits no discharge. Left eye exhibits no discharge. No scleral icterus.  Neck: Normal range of motion. Neck supple. Carotid bruit is not present. No thyromegaly present.  Cardiovascular: Normal rate, regular rhythm and normal heart sounds. Exam reveals no gallop and no friction rub.  No murmur heard. Pulmonary/Chest: Effort normal. No respiratory distress. She has no wheezes. She has rales in the right lower field and the left lower field. She exhibits tenderness. No breast tenderness or discharge.  Abdominal: Soft. Bowel sounds are normal. There is no tenderness. There is no rebound.  Genitourinary: No breast tenderness or discharge.  Musculoskeletal: Normal range of motion.  Lymphadenopathy:    She has no cervical adenopathy.  Neurological: She is alert and oriented to person, place, and time.  Skin: Skin is warm, dry and intact. No  rash noted.  Psychiatric: She has a normal mood and affect. Her speech is normal and behavior is normal. Judgment and thought content normal. Cognition and memory are normal.    Results for orders placed or performed in visit on 10/30/16  QuantiFERON-TB Gold Plus  Result Value Ref Range   QuantiFERON Incubation Comment    QuantiFERON Criteria Comment    QuantiFERON TB1 Ag Value 0.04 IU/mL   QuantiFERON TB2 Ag Value 0.04 IU/mL   QuantiFERON Nil Value 0.06 IU/mL   QuantiFERON  Mitogen Value >10.00 IU/mL   QuantiFERON-TB Gold Plus Negative Negative      Assessment & Plan:   Problem List Items Addressed This Visit    None    Visit Diagnoses    Cough    -  Primary   Pt with persistant cough.  some improvement.  R/O pnuemonia due to crackles and get chest x-ray.     Fever, unspecified fever cause       Flu vs pneumonia.  Get chest x-ray. Fever is resolved.  Add Z pack if x-ray positive for infiltrates      Differential is pneumonia partially treated with Amoxil vs flu.  Will get chest x-ra7  Follow up plan: Return for results.

## 2017-05-21 ENCOUNTER — Ambulatory Visit (INDEPENDENT_AMBULATORY_CARE_PROVIDER_SITE_OTHER): Payer: Commercial Managed Care - PPO | Admitting: Unknown Physician Specialty

## 2017-05-21 ENCOUNTER — Encounter: Payer: Self-pay | Admitting: Unknown Physician Specialty

## 2017-05-21 VITALS — BP 126/79 | HR 99 | Temp 98.9°F | Ht 68.5 in | Wt 168.9 lb

## 2017-05-21 DIAGNOSIS — J181 Lobar pneumonia, unspecified organism: Secondary | ICD-10-CM

## 2017-05-21 DIAGNOSIS — R05 Cough: Secondary | ICD-10-CM | POA: Diagnosis not present

## 2017-05-21 DIAGNOSIS — R059 Cough, unspecified: Secondary | ICD-10-CM

## 2017-05-21 DIAGNOSIS — J189 Pneumonia, unspecified organism: Secondary | ICD-10-CM

## 2017-05-21 MED ORDER — HYDROCOD POLST-CPM POLST ER 10-8 MG/5ML PO SUER: 5.0000 mL | Freq: Two times a day (BID) | ORAL | 0 refills | Status: DC | PRN

## 2017-05-21 NOTE — Progress Notes (Signed)
BP 126/79   Pulse 99   Temp 98.9 F (37.2 C) (Oral)   Ht 5' 8.5" (1.74 m)   Wt 168 lb 14.4 oz (76.6 kg)   LMP  (LMP Unknown)   SpO2 98%   BMI 25.31 kg/m    Subjective:    Patient ID: Suzanne Scott, female    DOB: July 05, 1960, 57 y.o.   MRN: [id number]  HPI: Suzanne Scott is a 57 y.o. female  Chief Complaint  Patient presents with  . Cough    pt states she doesn't think she is ready to go back to work because she is still coughing   Pneumonia Pt is here to f/u pneumonia.  She took Doxycycline.   .  She continues to be tired and has a persistent cough.  No fever.  No N/V/D/.  States today she started feeling better.  Denies SOB  Relevant past medical, surgical, family and social history reviewed and updated as indicated. Interim medical history since our last visit reviewed. Allergies and medications reviewed and updated.  Review of Systems  Per HPI unless specifically indicated above     Objective:    BP 126/79   Pulse 99   Temp 98.9 F (37.2 C) (Oral)   Ht 5' 8.5" (1.74 m)   Wt 168 lb 14.4 oz (76.6 kg)   LMP  (LMP Unknown)   SpO2 98%   BMI 25.31 kg/m   Wt Readings from Last 3 Encounters:  05/21/17 168 lb 14.4 oz (76.6 kg)  05/14/17 170 lb (77.1 kg)  01/26/17 172 lb 9.6 oz (78.3 kg)    Physical Exam  Constitutional: She is oriented to person, place, and time. She appears well-developed and well-nourished. No distress.  HENT:  Head: Normocephalic and atraumatic.  Eyes: Conjunctivae and lids are normal. Right eye exhibits no discharge. Left eye exhibits no discharge. No scleral icterus.  Neck: Normal range of motion. Neck supple. No JVD present. Carotid bruit is not present.  Cardiovascular: Normal rate, regular rhythm and normal heart sounds.  Pulmonary/Chest: Effort normal and breath sounds normal.  Abdominal: Normal appearance. There is no splenomegaly or hepatomegaly.  Musculoskeletal: Normal range of motion.  Neurological: She is alert and oriented  to person, place, and time.  Skin: Skin is warm, dry and intact. No rash noted. No pallor.  Psychiatric: She has a normal mood and affect. Her behavior is normal. Judgment and thought content normal.    Results for orders placed or performed in visit on 10/30/16  QuantiFERON-TB Gold Plus  Result Value Ref Range   QuantiFERON Incubation Comment    QuantiFERON Criteria Comment    QuantiFERON TB1 Ag Value 0.04 IU/mL   QuantiFERON TB2 Ag Value 0.04 IU/mL   QuantiFERON Nil Value 0.06 IU/mL   QuantiFERON Mitogen Value >10.00 IU/mL   QuantiFERON-TB Gold Plus Negative Negative      Assessment & Plan:   Problem List Items Addressed This Visit    None    Visit Diagnoses    Pneumonia of left lower lobe due to infectious organism [facility])    -  Primary   Discussed with patient rest and increase activity as tolerated.  No new antibiotics   Relevant Medications   chlorpheniramine-HYDROcodone (TUSSIONEX PENNKINETIC ER) 10-8 MG/5ML SUER   Other Relevant Orders   DG Chest 2 View   Cough       Refill Tussionex.  Pt ed on controlled substance security for this medication.  Note to be out of work 4/29.  Chest x-ray ordered for 2-3 weeks.    Follow up plan: Return if symptoms worsen or fail to improve.

## 2017-06-12 ENCOUNTER — Ambulatory Visit
Admission: RE | Admit: 2017-06-12 | Discharge: 2017-06-12 | Disposition: A | Discharge status: Home / Self Care | Payer: Commercial Managed Care - PPO | Source: Ambulatory Visit | Attending: Unknown Physician Specialty | Admitting: Unknown Physician Specialty

## 2017-06-12 DIAGNOSIS — J984 Other disorders of lung: Secondary | ICD-10-CM | POA: Insufficient documentation

## 2017-06-12 DIAGNOSIS — J189 Pneumonia, unspecified organism: Secondary | ICD-10-CM

## 2017-06-12 DIAGNOSIS — J181 Lobar pneumonia, unspecified organism: Secondary | ICD-10-CM | POA: Diagnosis not present

## 2017-06-12 DIAGNOSIS — R918 Other nonspecific abnormal finding of lung field: Secondary | ICD-10-CM | POA: Diagnosis not present

## 2017-07-02 ENCOUNTER — Other Ambulatory Visit: Payer: Self-pay | Admitting: Unknown Physician Specialty

## 2017-07-02 ENCOUNTER — Ambulatory Visit
Admission: RE | Admit: 2017-07-02 | Discharge: 2017-07-02 | Disposition: A | Discharge status: Home / Self Care | Payer: Commercial Managed Care - PPO | Source: Ambulatory Visit | Attending: Unknown Physician Specialty | Admitting: Unknown Physician Specialty

## 2017-07-02 DIAGNOSIS — Z8701 Personal history of pneumonia (recurrent): Secondary | ICD-10-CM

## 2017-07-02 DIAGNOSIS — R918 Other nonspecific abnormal finding of lung field: Secondary | ICD-10-CM | POA: Insufficient documentation

## 2017-07-31 ENCOUNTER — Other Ambulatory Visit (HOSPITAL_COMMUNITY)
Admission: RE | Admit: 2017-07-31 | Discharge: 2017-07-31 | Disposition: A | Discharge status: Home / Self Care | Payer: Commercial Managed Care - PPO | Source: Ambulatory Visit | Attending: Unknown Physician Specialty | Admitting: Unknown Physician Specialty

## 2017-07-31 ENCOUNTER — Encounter: Payer: Self-pay | Admitting: Unknown Physician Specialty

## 2017-07-31 ENCOUNTER — Ambulatory Visit (INDEPENDENT_AMBULATORY_CARE_PROVIDER_SITE_OTHER): Payer: Commercial Managed Care - PPO | Admitting: Unknown Physician Specialty

## 2017-07-31 VITALS — BP 119/74 | HR 63 | Temp 98.1°F | Ht 68.5 in | Wt 169.0 lb

## 2017-07-31 DIAGNOSIS — F324 Major depressive disorder, single episode, in partial remission: Secondary | ICD-10-CM

## 2017-07-31 DIAGNOSIS — Z0000 Encounter for general adult medical examination without abnormal findings: Secondary | ICD-10-CM | POA: Diagnosis not present

## 2017-07-31 DIAGNOSIS — R222 Localized swelling, mass and lump, trunk: Secondary | ICD-10-CM | POA: Diagnosis not present

## 2017-07-31 DIAGNOSIS — M654 Radial styloid tenosynovitis [de Quervain]: Secondary | ICD-10-CM | POA: Diagnosis not present

## 2017-07-31 DIAGNOSIS — K219 Gastro-esophageal reflux disease without esophagitis: Secondary | ICD-10-CM | POA: Diagnosis not present

## 2017-07-31 DIAGNOSIS — Z0001 Encounter for general adult medical examination with abnormal findings: Secondary | ICD-10-CM | POA: Diagnosis not present

## 2017-07-31 NOTE — Progress Notes (Signed)
BP 119/74   Pulse 63   Temp 98.1 F (36.7 C) (Oral)   Ht 5' 8.5" (1.74 m)   Wt 169 lb (76.7 kg)   LMP  (LMP Unknown)   SpO2 99%   BMI 25.32 kg/m    Subjective:    Patient ID: Suzanne Scott, female    DOB: 05-May-1960, 57 y.o.   MRN: [id number]  HPI: Suzanne Scott is a 57 y.o. female  Chief Complaint  Patient presents with  . Annual Exam  . Hand Pain    bilateral, left worse than right. States the pain feels like fire sometimes  . Gastroesophageal Reflux    pt states she thinks she may have been having trouble with acid reflux, states she gets a bitter taste in her mouth and burps more now  . Cyst    pt states she has a knot on her side of her chest that hurt   GERD Acid reflux for over a year with a bad taste and burps.  Having brash.    Hand pain Bilateral hand pain, mostly below index finger and aggravated with thumb spica  Depression screen [facility] 2/9 07/31/2017 01/26/2017 02/09/2016 01/10/2016 12/13/2015  Decreased Interest 0 1 0 0 0  Down, Depressed, Hopeless 0 1 0 0 2  PHQ - 2 Score 0 2 0 0 2  Altered sleeping 0 1 3 3 1   Tired, decreased energy 0 1 1 2 1   Change in appetite 1 0 1 3 3   Feeling bad or failure about yourself  0 0 0 0 0  Trouble concentrating 2 3 1 3 3   Moving slowly or fidgety/restless 0 0 0 0 3  Suicidal thoughts 0 0 0 0 0  PHQ-9 Score 3 7 6 11 13    Social History   Socioeconomic History  . Marital status: Married    Spouse name: Not on file  . Number of children: 3  . Years of education: 35  . Highest education level: Not on file  Occupational History  . Not on file  Social Needs  . Financial resource strain: Not on file  . Food insecurity:    Worry: Not on file    Inability: Not on file  . Transportation needs:    Medical: Not on file    Non-medical: Not on file  Tobacco Use  . Smoking status: Never Smoker  . Smokeless tobacco: Never Used  Substance and Sexual Activity  . Alcohol use: Yes    Alcohol/week: 0.0 oz    Comment:  pt states she drinks wine on occassion  . Drug use: No  . Sexual activity: Yes  Lifestyle  . Physical activity:    Days per week: Not on file    Minutes per session: Not on file  . Stress: Not on file  Relationships  . Social connections:    Talks on phone: Not on file    Gets together: Not on file    Attends religious service: Not on file    Active member of club or organization: Not on file    Attends meetings of clubs or organizations: Not on file    Relationship status: Not on file  . Intimate partner violence:    Fear of current or ex partner: Not on file    Emotionally abused: Not on file    Physically abused: Not on file    Forced sexual activity: Not on file  Other Topics Concern  . Not on file  Social History Narrative  . Not on file   Family History  Problem Relation Age of Onset  . Arthritis Mother   . Heart disease Mother   . Breast cancer Neg Hx    Past Medical History:  Diagnosis Date  . Anxiety   . Atrophic vaginitis   . Hyperlipidemia    Past Surgical History:  Procedure Laterality Date  . BREAST BIOPSY Left 04/08/2015   negative     Relevant past medical, surgical, family and social history reviewed and updated as indicated. Interim medical history since our last visit reviewed. Allergies and medications reviewed and updated.  Review of Systems  Constitutional: Negative.   HENT: Negative.   Eyes: Negative.   Respiratory: Negative.   Cardiovascular: Negative.   Endocrine: Negative.   Genitourinary: Negative.   Skin: Negative.   Allergic/Immunologic: Negative.   Neurological: Negative.   Hematological: Negative.   Psychiatric/Behavioral: Negative.     Per HPI unless specifically indicated above     Objective:    BP 119/74   Pulse 63   Temp 98.1 F (36.7 C) (Oral)   Ht 5' 8.5" (1.74 m)   Wt 169 lb (76.7 kg)   LMP  (LMP Unknown)   SpO2 99%   BMI 25.32 kg/m   Wt Readings from Last 3 Encounters:  07/31/17 169 lb (76.7 kg)    05/21/17 168 lb 14.4 oz (76.6 kg)  05/14/17 170 lb (77.1 kg)    Physical Exam  Constitutional: She is oriented to person, place, and time. She appears well-developed and well-nourished.  HENT:  Head: Normocephalic and atraumatic.  Eyes: Pupils are equal, round, and reactive to light. Right eye exhibits no discharge. Left eye exhibits no discharge. No scleral icterus.  Neck: Normal range of motion. Neck supple. Carotid bruit is not present. No thyromegaly present.  Cardiovascular: Normal rate, regular rhythm and normal heart sounds. Exam reveals no gallop and no friction rub.  No murmur heard. Pulmonary/Chest: Effort normal and breath sounds normal. No respiratory distress. She has no wheezes. She has no rales. She exhibits mass. Right breast exhibits no inverted nipple, no mass, no nipple discharge, no skin change and no tenderness. Left breast exhibits no inverted nipple, no mass, no nipple discharge, no skin change and no tenderness. No breast swelling, tenderness, discharge or bleeding. Breasts are symmetrical.  Mass right chest wall, fixed and non-tender  Abdominal: Soft. Bowel sounds are normal. There is no tenderness. There is no rebound.  Genitourinary: Vagina normal and uterus normal. No breast tenderness or discharge. Cervix exhibits no motion tenderness, no discharge and no friability. Right adnexum displays no mass, no tenderness and no fullness. Left adnexum displays no mass, no tenderness and no fullness.  Musculoskeletal: Normal range of motion.  Lymphadenopathy:    She has no cervical adenopathy.  Neurological: She is alert and oriented to person, place, and time.  Skin: Skin is warm, dry and intact. No rash noted.  Psychiatric: She has a normal mood and affect. Her speech is normal and behavior is normal. Judgment and thought content normal. Cognition and memory are normal.    Results for orders placed or performed in visit on 10/30/16  QuantiFERON-TB Gold Plus  Result  Value Ref Range   QuantiFERON Incubation Comment    QuantiFERON Criteria Comment    QuantiFERON TB1 Ag Value 0.04 IU/mL   QuantiFERON TB2 Ag Value 0.04 IU/mL   QuantiFERON Nil Value 0.06 IU/mL   QuantiFERON Mitogen Value >10.00 IU/mL   QuantiFERON-TB  Gold Plus Negative Negative      Assessment & Plan:   Problem List Items Addressed This Visit      Unprioritized   Depression    Stable, continue present medications.        GERD (gastroesophageal reflux disease)    Will try diet elimination.  OK for prn Ranitidine      Tendinitis, de Quervain's    Bilateral.  Right worse than left.  Use spica splint       Other Visit Diagnoses    Mass of right chest wall    -  Primary   Unclear if this is a mass vs an normal physiological variant.  Will get [place] of right breast and axilla   Relevant Orders   MM Digital Diagnostic Bilat   US BREAST LTD UNI RIGHT INC AXILLA   Routine general medical examination at a health care facility       Relevant Orders   Cytology - PAP   TSH   CBC with Differential/Platelet   Comprehensive metabolic panel   Lipid Panel w/o Chol/HDL Ratio   US BREAST LTD UNI RIGHT INC AXILLA       Follow up plan: F/u with results

## 2017-07-31 NOTE — Patient Instructions (Signed)
Please do call to schedule your mammogram; the number to schedule one at either [facility] or [facility] is [phone]   

## 2017-07-31 NOTE — Assessment & Plan Note (Signed)
Bilateral.  Right worse than left.  Use spica splint

## 2017-07-31 NOTE — Assessment & Plan Note (Signed)
Will try diet elimination.  OK for prn Ranitidine

## 2017-07-31 NOTE — Assessment & Plan Note (Signed)
Stable, continue present medications.   

## 2017-08-01 ENCOUNTER — Encounter: Payer: Self-pay | Admitting: Unknown Physician Specialty

## 2017-08-01 ENCOUNTER — Other Ambulatory Visit: Payer: Self-pay | Admitting: Unknown Physician Specialty

## 2017-08-01 DIAGNOSIS — R718 Other abnormality of red blood cells: Secondary | ICD-10-CM

## 2017-08-01 LAB — CBC WITH DIFFERENTIAL/PLATELET
BASOS ABS: 0 10*3/uL (ref 0.0–0.2)
Basos: 0 %
EOS (ABSOLUTE): 0.1 10*3/uL (ref 0.0–0.4)
Eos: 2 %
Hematocrit: 41.7 % (ref 34.0–46.6)
Hemoglobin: 12.6 g/dL (ref 11.1–15.9)
Immature Grans (Abs): 0 10*3/uL (ref 0.0–0.1)
Immature Granulocytes: 0 %
LYMPHS ABS: 2.3 10*3/uL (ref 0.7–3.1)
Lymphs: 48 %
MCH: 25.9 pg — AB (ref 26.6–33.0)
MCHC: 30.2 g/dL — AB (ref 31.5–35.7)
MCV: 86 fL (ref 79–97)
Monocytes Absolute: 0.5 10*3/uL (ref 0.1–0.9)
Monocytes: 10 %
NEUTROS ABS: 1.9 10*3/uL (ref 1.4–7.0)
Neutrophils: 40 %
Platelets: 274 10*3/uL (ref 150–450)
RBC: 4.87 x10E6/uL (ref 3.77–5.28)
RDW: 16.2 % — ABNORMAL HIGH (ref 12.3–15.4)
WBC: 4.7 10*3/uL (ref 3.4–10.8)

## 2017-08-01 LAB — COMPREHENSIVE METABOLIC PANEL
A/G RATIO: 1.5 (ref 1.2–2.2)
ALBUMIN: 4.4 g/dL (ref 3.5–5.5)
ALK PHOS: 64 IU/L (ref 39–117)
ALT: 19 IU/L (ref 0–32)
AST: 20 IU/L (ref 0–40)
BILIRUBIN TOTAL: 0.4 mg/dL (ref 0.0–1.2)
BUN / CREAT RATIO: 13 (ref 9–23)
BUN: 11 mg/dL (ref 6–24)
CHLORIDE: 102 mmol/L (ref 96–106)
CO2: 25 mmol/L (ref 20–29)
Calcium: 9.4 mg/dL (ref 8.7–10.2)
Creatinine, Ser: 0.83 mg/dL (ref 0.57–1.00)
GFR calc Af Amer: 90 mL/min/{1.73_m2} (ref >59)
GFR calc non Af Amer: 78 mL/min/{1.73_m2} (ref >59)
GLUCOSE: 84 mg/dL (ref 65–99)
Globulin, Total: 2.9 g/dL (ref 1.5–4.5)
POTASSIUM: 4.2 mmol/L (ref 3.5–5.2)
SODIUM: 140 mmol/L (ref 134–144)
Total Protein: 7.3 g/dL (ref 6.0–8.5)

## 2017-08-01 LAB — LIPID PANEL W/O CHOL/HDL RATIO
CHOLESTEROL TOTAL: 231 mg/dL — AB (ref 100–199)
HDL: 48 mg/dL (ref >39)
LDL Calculated: 155 mg/dL — ABNORMAL HIGH (ref 0–99)
Triglycerides: 138 mg/dL (ref 0–149)
VLDL CHOLESTEROL CAL: 28 mg/dL (ref 5–40)

## 2017-08-01 LAB — TSH: TSH: 1.22 u[IU]/mL (ref 0.450–4.500)

## 2017-08-01 NOTE — Progress Notes (Signed)
Hi Suzanne Scott, your labs look OK.  You are not anemic, but your MCH/MCV/MCHC being low may indicate a low iron.  I would like to do a couple of things.  First, I would like to check an iron level.  They can add it on your labs.  Second, to make sure thiere is not a small amount of bleeding in your GI tract, I will order you to have your colonoscopy, due in 2020, sooner.  I will put the order in.

## 2017-08-06 LAB — CYTOLOGY - PAP
Diagnosis: NEGATIVE
HPV: NOT DETECTED

## 2017-08-09 ENCOUNTER — Ambulatory Visit
Admission: RE | Admit: 2017-08-09 | Discharge: 2017-08-09 | Disposition: A | Discharge status: Home / Self Care | Payer: Commercial Managed Care - PPO | Source: Ambulatory Visit | Attending: Unknown Physician Specialty | Admitting: Unknown Physician Specialty

## 2017-08-09 DIAGNOSIS — Z0000 Encounter for general adult medical examination without abnormal findings: Secondary | ICD-10-CM | POA: Insufficient documentation

## 2017-08-09 DIAGNOSIS — R222 Localized swelling, mass and lump, trunk: Secondary | ICD-10-CM

## 2017-08-23 LAB — FERRITIN: FERRITIN: 247 ng/mL — AB (ref 15–150)

## 2017-08-23 LAB — SPECIMEN STATUS REPORT

## 2017-09-01 ENCOUNTER — Other Ambulatory Visit: Payer: Self-pay | Admitting: Unknown Physician Specialty

## 2017-09-13 ENCOUNTER — Ambulatory Visit: Payer: Commercial Managed Care - PPO | Admitting: Gastroenterology

## 2017-09-14 ENCOUNTER — Other Ambulatory Visit: Payer: Self-pay

## 2017-09-14 ENCOUNTER — Encounter: Payer: Self-pay | Admitting: Gastroenterology

## 2017-09-14 ENCOUNTER — Ambulatory Visit (INDEPENDENT_AMBULATORY_CARE_PROVIDER_SITE_OTHER): Payer: Commercial Managed Care - PPO | Admitting: Gastroenterology

## 2017-09-14 VITALS — BP 139/72 | HR 71 | Resp 17 | Ht 68.5 in | Wt 168.0 lb

## 2017-09-14 DIAGNOSIS — Z1211 Encounter for screening for malignant neoplasm of colon: Secondary | ICD-10-CM

## 2017-09-14 DIAGNOSIS — K219 Gastro-esophageal reflux disease without esophagitis: Secondary | ICD-10-CM

## 2017-09-14 MED ORDER — OMEPRAZOLE 40 MG PO CPDR: 40.0000 mg | DELAYED_RELEASE_CAPSULE | Freq: Every day | ORAL | 0 refills | Status: DC

## 2017-09-14 NOTE — Progress Notes (Signed)
disease  Cephas Darby, MD [address]  [place]  [place], [place] [address]  Main: [phone]  Fax: [phone]    Gastroenterology Consultation  Referring Provider:     Kathrine Haddock, NP Primary Care Physician:  Trinna Post, PA-C Primary Gastroenterologist:  Dr. Cephas Darby Reason for Consultation:     GERD        HPI:   Suzanne Scott is a 57 y.o. female referred by Dr. Trinna Post, PA-C  for consultation & management of chronic GERD Symptoms started 1 yr ago, worse lately Bitter taste, regurgitation, clearing of throat, hoarseness, "choking", also occurring at night Works night 3 x week Tried nexium 20mg  for 2 weeks, no relief She recently returned from [place] after vacation for 1 week. It was hot and humid and was stressful She also has been experiencing intermittent left-sided chest pain radiating to the shoulder blade for the last 2 weeks She denies chest pain on exertion, exertional shortness of breath or radiation to left arm. She has appointment with her primary care provider next monday  NSAIDs: none  Antiplts/Anticoagulants/Anti thrombotics: none  GI Procedures: none She did not have GI surgeries No family history of GI malignancy  Past Medical History:  Diagnosis Date  . Anxiety   . Atrophic vaginitis   . Hyperlipidemia     Past Surgical History:  Procedure Laterality Date  . BREAST BIOPSY Left 04/08/2015   negative     Current Outpatient Medications:  .  citalopram (CELEXA) 20 MG tablet, TK 1 T PO D, Disp: , Rfl:  .  omeprazole (PRILOSEC) 40 MG capsule, Take 1 capsule (40 mg total) by mouth daily before breakfast., Disp: 90 capsule, Rfl: 0  Current Facility-Administered Medications:  .  betamethasone acetate-betamethasone sodium phosphate (CELESTONE) injection 3 mg, 3 mg, Intramuscular, Once, Evans, Brent M, DPM .  betamethasone acetate-betamethasone sodium phosphate (CELESTONE) injection 3 mg, 3 mg, Intramuscular,  Once, Evans, Brent M, DPM .  betamethasone acetate-betamethasone sodium phosphate (CELESTONE) injection 3 mg, 3 mg, Intramuscular, Once, Evans, Brent M, DPM .  betamethasone acetate-betamethasone sodium phosphate (CELESTONE) injection 3 mg, 3 mg, Intramuscular, Once, Edrick Kins, DPM   Family History  Problem Relation Age of Onset  . Arthritis Mother   . Heart disease Mother   . Breast cancer Neg Hx      Social History   Tobacco Use  . Smoking status: Never Smoker  . Smokeless tobacco: Never Used  Substance Use Topics  . Alcohol use: Yes    Alcohol/week: 0.0 standard drinks    Comment: pt states she drinks wine on occassion  . Drug use: No    Allergies as of 09/14/2017  . (No Known Allergies)    Review of Systems:    All systems reviewed and negative except where noted in HPI.   Physical Exam:  BP 139/72 (BP Location: Left Arm, Patient Position: Sitting, Cuff Size: Normal)   Pulse 71   Resp 17   Ht 5' 8.5" (1.74 m)   Wt 168 lb (76.2 kg)   LMP  (LMP Unknown)   BMI 25.17 kg/m  No LMP recorded (lmp unknown). Patient is postmenopausal.  General:   Alert,  Well-developed, well-nourished, pleasant and cooperative in NAD Head:  Normocephalic and atraumatic. Eyes:  Sclera clear, no icterus.   Conjunctiva pink. Ears:  Normal auditory acuity. Nose:  No deformity, discharge, or lesions. Mouth:  No deformity or lesions,oropharynx pink & moist. Neck:  Supple; no  masses or thyromegaly. Lungs:  Respirations even and unlabored.  Clear throughout to auscultation.   No wheezes, crackles, or rhonchi. No acute distress. Heart:  Regular rate and rhythm; no murmurs, clicks, rubs, or gallops. Abdomen:  Normal bowel sounds. Soft, non-tender and non-distended without masses, hepatosplenomegaly or hernias noted.  No guarding or rebound tenderness.   Rectal: Not performed Msk:  Symmetrical without gross deformities. Good, equal movement & strength bilaterally. Pulses:  Normal pulses  noted. Extremities:  No clubbing or edema.  No cyanosis. Neurologic:  Alert and oriented x3;  grossly normal neurologically. Skin:  Intact without significant lesions or rashes. No jaundice. Lymph Nodes:  No significant cervical adenopathy. Psych:  Alert and cooperative. Normal mood and affect.  Imaging Studies: none  Assessment and Plan:   Suzanne Scott is a 57 y.o. African-American female from [place] with chronic GERD with acute worsening of symptoms of regurgitation, bitter taste in mouth, hoarseness of voice, choking spells.   - start omeprazole 40 mg 30 minutes before breakfast or dinner daily - Discuss with her about antireflux measures - Performed EGD for further evaluation  She is also due for colon cancer screening Last colonoscopy in 2010, normal  Follow up in 2 months   Cephas Darby, MD

## 2017-09-17 ENCOUNTER — Other Ambulatory Visit: Payer: Self-pay

## 2017-09-17 ENCOUNTER — Encounter: Payer: Self-pay | Admitting: Physician Assistant

## 2017-09-17 ENCOUNTER — Ambulatory Visit (INDEPENDENT_AMBULATORY_CARE_PROVIDER_SITE_OTHER): Payer: Commercial Managed Care - PPO | Admitting: Physician Assistant

## 2017-09-17 VITALS — BP 124/75 | HR 75 | Temp 98.1°F | Ht 68.5 in | Wt 167.4 lb

## 2017-09-17 DIAGNOSIS — F419 Anxiety disorder, unspecified: Secondary | ICD-10-CM | POA: Diagnosis not present

## 2017-09-17 DIAGNOSIS — M25512 Pain in left shoulder: Secondary | ICD-10-CM

## 2017-09-17 DIAGNOSIS — M79642 Pain in left hand: Secondary | ICD-10-CM | POA: Diagnosis not present

## 2017-09-17 MED ORDER — CITALOPRAM HYDROBROMIDE 20 MG PO TABS: ORAL_TABLET | ORAL | 0 refills | Status: DC

## 2017-09-17 NOTE — Patient Instructions (Signed)
Cock up wrist splint for carpal tunnel- wear this at night.    Generalized Anxiety Disorder, Adult Generalized anxiety disorder (GAD) is a mental health disorder. People with this condition constantly worry about everyday events. Unlike normal anxiety, worry related to GAD is not triggered by a specific event. These worries also do not fade or get better with time. GAD interferes with life functions, including relationships, work, and school. GAD can vary from mild to severe. People with severe GAD can have intense waves of anxiety with physical symptoms (panic attacks). What are the causes? The exact cause of GAD is not known. What increases the risk? This condition is more likely to develop in:  Women.  People who have a family history of anxiety disorders.  People who are very shy.  People who experience very stressful life events, such as the death of a loved one.  People who have a very stressful family environment.  What are the signs or symptoms? People with GAD often worry excessively about many things in their lives, such as their health and family. They may also be overly concerned about:  Doing well at work.  Being on time.  Natural disasters.  Friendships.  Physical symptoms of GAD include:  Fatigue.  Muscle tension or having muscle twitches.  Trembling or feeling shaky.  Being easily startled.  Feeling like your heart is pounding or racing.  Feeling out of breath or like you cannot take a deep breath.  Having trouble falling asleep or staying asleep.  Sweating.  Nausea, diarrhea, or irritable bowel syndrome (IBS).  Headaches.  Trouble concentrating or remembering facts.  Restlessness.  Irritability.  How is this diagnosed? Your health care provider can diagnose GAD based on your symptoms and medical history. You will also have a physical exam. The health care provider will ask specific questions about your symptoms, including how severe they  are, when they started, and if they come and go. Your health care provider may ask you about your use of alcohol or drugs, including prescription medicines. Your health care provider may refer you to a mental health specialist for further evaluation. Your health care provider will do a thorough examination and may perform additional tests to rule out other possible causes of your symptoms. To be diagnosed with GAD, a person must have anxiety that:  Is out of his or her control.  Affects several different aspects of his or her life, such as work and relationships.  Causes distress that makes him or her unable to take part in normal activities.  Includes at least three physical symptoms of GAD, such as restlessness, fatigue, trouble concentrating, irritability, muscle tension, or sleep problems.  Before your health care provider can confirm a diagnosis of GAD, these symptoms must be present more days than they are not, and they must last for six months or longer. How is this treated? The following therapies are usually used to treat GAD:  Medicine. Antidepressant medicine is usually prescribed for long-term daily control. Antianxiety medicines may be added in severe cases, especially when panic attacks occur.  Talk therapy (psychotherapy). Certain types of talk therapy can be helpful in treating GAD by providing support, education, and guidance. Options include: ? Cognitive behavioral therapy (CBT). People learn coping skills and techniques to ease their anxiety. They learn to identify unrealistic or negative thoughts and behaviors and to replace them with positive ones. ? Acceptance and commitment therapy (ACT). This treatment teaches people how to be mindful as a way  to cope with unwanted thoughts and feelings. ? Biofeedback. This process trains you to manage your body's response (physiological response) through breathing techniques and relaxation methods. You will work with a therapist while  machines are used to monitor your physical symptoms.  Stress management techniques. These include yoga, meditation, and exercise.  A mental health specialist can help determine which treatment is best for you. Some people see improvement with one type of therapy. However, other people require a combination of therapies. Follow these instructions at home:  Take over-the-counter and prescription medicines only as told by your health care provider.  Try to maintain a normal routine.  Try to anticipate stressful situations and allow extra time to manage them.  Practice any stress management or self-calming techniques as taught by your health care provider.  Do not punish yourself for setbacks or for not making progress.  Try to recognize your accomplishments, even if they are small.  Keep all follow-up visits as told by your health care provider. This is important. Contact a health care provider if:  Your symptoms do not get better.  Your symptoms get worse.  You have signs of depression, such as: ? A persistently sad, cranky, or irritable mood. ? Loss of enjoyment in activities that used to bring you joy. ? Change in weight or eating. ? Changes in sleeping habits. ? Avoiding friends or family members. ? Loss of energy for normal tasks. ? Feelings of guilt or worthlessness. Get help right away if:  You have serious thoughts about hurting yourself or others. If you ever feel like you may hurt yourself or others, or have thoughts about taking your own life, get help right away. You can go to your nearest emergency department or call:  Your local emergency services (911 in the [place]  A suicide crisis helpline, such as the [facility] at 517-364-5941. This is open 24 hours a day.  Summary  Generalized anxiety disorder (GAD) is a mental health disorder that involves worry that is not triggered by a specific event.  People with GAD often worry  excessively about many things in their lives, such as their health and family.  GAD may cause physical symptoms such as restlessness, trouble concentrating, sleep problems, frequent sweating, nausea, diarrhea, headaches, and trembling or muscle twitching.  A mental health specialist can help determine which treatment is best for you. Some people see improvement with one type of therapy. However, other people require a combination of therapies. This information is not intended to replace advice given to you by your health care provider. Make sure you discuss any questions you have with your health care provider. Document Released: 05/13/2012 Document Revised: 12/07/2015 Document Reviewed: 12/07/2015 Elsevier Interactive Patient Education  [employer].

## 2017-09-17 NOTE — Progress Notes (Signed)
Subjective:    Patient ID: Suzanne Scott, female    DOB: 25-Sep-1960, 57 y.o.   MRN: [id number]  Suzanne Scott is a 57 y.o. female presenting on 09/17/2017 for Depression and Gastroesophageal Reflux   HPI   Depression: Ran out of 20 mg celexa recently, had been on this prior with food success.  Omeprazole: 40 mg daily for GERD, prescribed by GI doctor.   Reports left shoulder and neck pain, stiffness. It travels into her back. Also experiencing right hand pain in her first two fingers. Works as a [employer] manager.   Social History   Tobacco Use  . Smoking status: Never Smoker  . Smokeless tobacco: Never Used  Substance Use Topics  . Alcohol use: Yes    Alcohol/week: 0.0 standard drinks    Comment: pt states she drinks wine on occassion  . Drug use: No    Review of Systems Per HPI unless specifically indicated above     Objective:    BP 124/75   Pulse 75   Temp 98.1 F (36.7 C) (Oral)   Ht 5' 8.5" (1.74 m)   Wt 167 lb 6.4 oz (75.9 kg)   LMP  (LMP Unknown)   SpO2 100%   BMI 25.08 kg/m   Wt Readings from Last 3 Encounters:  09/17/17 167 lb 6.4 oz (75.9 kg)  09/14/17 168 lb (76.2 kg)  07/31/17 169 lb (76.7 kg)    Physical Exam  Constitutional: She is oriented to person, place, and time. She appears well-developed and well-nourished.  Cardiovascular: Normal rate and regular rhythm.  Pulmonary/Chest: Effort normal and breath sounds normal.  Musculoskeletal:  Left trapezius stiff to palpation. Left hand with full ROM. Negative Tinels and Phalen's.   Neurological: She is alert and oriented to person, place, and time.  Skin: Skin is warm and dry.  Psychiatric: She has a normal mood and affect. Her behavior is normal.   Results for orders placed or performed in visit on 07/31/17  TSH  Result Value Ref Range   TSH 1.220 0.450 - 4.500 uIU/mL  CBC with Differential/Platelet  Result Value Ref Range   WBC 4.7 3.4 - 10.8 x10E3/uL   RBC 4.87 3.77 - 5.28 x10E6/uL   Hemoglobin  12.6 11.1 - 15.9 g/dL   Hematocrit 41.7 34.0 - 46.6 %   MCV 86 79 - 97 fL   MCH 25.9 (L) 26.6 - 33.0 pg   MCHC 30.2 (L) 31.5 - 35.7 g/dL   RDW 16.2 (H) 12.3 - 15.4 %   Platelets 274 150 - 450 x10E3/uL   Neutrophils 40 Not Estab. %   Lymphs 48 Not Estab. %   Monocytes 10 Not Estab. %   Eos 2 Not Estab. %   Basos 0 Not Estab. %   Neutrophils Absolute 1.9 1.4 - 7.0 x10E3/uL   Lymphocytes Absolute 2.3 0.7 - 3.1 x10E3/uL   Monocytes Absolute 0.5 0.1 - 0.9 x10E3/uL   EOS (ABSOLUTE) 0.1 0.0 - 0.4 x10E3/uL   Basophils Absolute 0.0 0.0 - 0.2 x10E3/uL   Immature Granulocytes 0 Not Estab. %   Immature Grans (Abs) 0.0 0.0 - 0.1 x10E3/uL  Comprehensive metabolic panel  Result Value Ref Range   Glucose 84 65 - 99 mg/dL   BUN 11 6 - 24 mg/dL   Creatinine, Ser 0.83 0.57 - 1.00 mg/dL   GFR calc non Af Amer 78 >59 mL/min/1.73   GFR calc Af Amer 90 >59 mL/min/1.73   BUN/Creatinine Ratio 13 9 - 23  Sodium 140 134 - 144 mmol/L   Potassium 4.2 3.5 - 5.2 mmol/L   Chloride 102 96 - 106 mmol/L   CO2 25 20 - 29 mmol/L   Calcium 9.4 8.7 - 10.2 mg/dL   Total Protein 7.3 6.0 - 8.5 g/dL   Albumin 4.4 3.5 - 5.5 g/dL   Globulin, Total 2.9 1.5 - 4.5 g/dL   Albumin/Globulin Ratio 1.5 1.2 - 2.2   Bilirubin Total 0.4 0.0 - 1.2 mg/dL   Alkaline Phosphatase 64 39 - 117 IU/L   AST 20 0 - 40 IU/L   ALT 19 0 - 32 IU/L  Lipid Panel w/o Chol/HDL Ratio  Result Value Ref Range   Cholesterol, Total 231 (H) 100 - 199 mg/dL   Triglycerides 138 0 - 149 mg/dL   HDL 48 >39 mg/dL   VLDL Cholesterol Cal 28 5 - 40 mg/dL   LDL Calculated 155 (H) 0 - 99 mg/dL  Ferritin  Result Value Ref Range   Ferritin 247 (H) 15 - 150 ng/mL  Specimen status report  Result Value Ref Range   specimen status report Comment   Cytology - PAP  Result Value Ref Range   Adequacy      Satisfactory for evaluation  endocervical/transformation zone component PRESENT.   Diagnosis      NEGATIVE FOR INTRAEPITHELIAL LESIONS OR MALIGNANCY.    HPV NOT DETECTED    Material Submitted CervicoVaginal Pap [ThinPrep Imaged]    CYTOLOGY - PAP PAP RESULT       Assessment & Plan:  1. Anxiety  Start celexa 20 mg.  - citalopram (CELEXA) 20 MG tablet; TK 1 T PO D  Dispense: 90 tablet; Refill: 0  2. Left hand pain  Possible carpal tunnel, try brace and anti-inflammatories.  3. Left shoulder pain  Warm heat, massage.     Follow up plan: Return in about 6 months (around 03/20/2018) for anxiety .  Carles Collet, PA-C [facility] 09/18/2017, 12:17 PM

## 2017-10-02 ENCOUNTER — Telehealth: Payer: Self-pay | Admitting: Gastroenterology

## 2017-10-02 NOTE — Telephone Encounter (Signed)
Patient wants to reschedule her procedure from 9.16.19 to 9.9.19 if possible. Pt requesting call back to discuss rescheduling.

## 2017-10-02 NOTE — Telephone Encounter (Signed)
Patient wanted to reschedule her colonoscopy from the 16th to the 9th or 10th. Dr. Marius Ditch does not have anything available for those days.  Patient said she will keep her already scheduled date.  Thanks [employer]

## 2017-10-02 NOTE — Telephone Encounter (Signed)
Pt is calling about r/s her procedure

## 2017-10-12 ENCOUNTER — Encounter: Payer: Self-pay | Admitting: *Deleted

## 2017-10-15 ENCOUNTER — Encounter: Payer: Self-pay | Admitting: Anesthesiology

## 2017-10-15 ENCOUNTER — Encounter
Admission: RE | Disposition: A | Discharge status: Home / Self Care | Payer: Self-pay | Source: Ambulatory Visit | Attending: Gastroenterology

## 2017-10-15 ENCOUNTER — Ambulatory Visit: Payer: Commercial Managed Care - PPO | Admitting: Anesthesiology

## 2017-10-15 ENCOUNTER — Ambulatory Visit
Admission: RE | Admit: 2017-10-15 | Discharge: 2017-10-15 | Disposition: A | Discharge status: Home / Self Care | Payer: Commercial Managed Care - PPO | Source: Ambulatory Visit | Attending: Gastroenterology | Admitting: Gastroenterology

## 2017-10-15 DIAGNOSIS — K621 Rectal polyp: Secondary | ICD-10-CM | POA: Insufficient documentation

## 2017-10-15 DIAGNOSIS — Z1211 Encounter for screening for malignant neoplasm of colon: Secondary | ICD-10-CM | POA: Diagnosis not present

## 2017-10-15 DIAGNOSIS — F329 Major depressive disorder, single episode, unspecified: Secondary | ICD-10-CM | POA: Insufficient documentation

## 2017-10-15 DIAGNOSIS — R12 Heartburn: Secondary | ICD-10-CM | POA: Insufficient documentation

## 2017-10-15 DIAGNOSIS — M1990 Unspecified osteoarthritis, unspecified site: Secondary | ICD-10-CM | POA: Diagnosis not present

## 2017-10-15 DIAGNOSIS — K635 Polyp of colon: Secondary | ICD-10-CM | POA: Insufficient documentation

## 2017-10-15 DIAGNOSIS — Z8249 Family history of ischemic heart disease and other diseases of the circulatory system: Secondary | ICD-10-CM | POA: Insufficient documentation

## 2017-10-15 DIAGNOSIS — Z79899 Other long term (current) drug therapy: Secondary | ICD-10-CM | POA: Insufficient documentation

## 2017-10-15 DIAGNOSIS — D124 Benign neoplasm of descending colon: Secondary | ICD-10-CM

## 2017-10-15 DIAGNOSIS — K219 Gastro-esophageal reflux disease without esophagitis: Secondary | ICD-10-CM | POA: Diagnosis not present

## 2017-10-15 DIAGNOSIS — F419 Anxiety disorder, unspecified: Secondary | ICD-10-CM | POA: Diagnosis not present

## 2017-10-15 DIAGNOSIS — E785 Hyperlipidemia, unspecified: Secondary | ICD-10-CM | POA: Insufficient documentation

## 2017-10-15 HISTORY — PX: COLONOSCOPY WITH PROPOFOL: SHX5780

## 2017-10-15 HISTORY — PX: ESOPHAGOGASTRODUODENOSCOPY (EGD) WITH PROPOFOL: SHX5813

## 2017-10-15 SURGERY — ESOPHAGOGASTRODUODENOSCOPY (EGD) WITH PROPOFOL
Anesthesia: General

## 2017-10-15 MED ORDER — SODIUM CHLORIDE 0.9 % IV SOLN
INTRAVENOUS | Status: DC
  Administered 2017-10-15: 1000 mL via INTRAVENOUS

## 2017-10-15 MED ORDER — PROPOFOL 10 MG/ML IV BOLUS
INTRAVENOUS | Status: DC | PRN
  Administered 2017-10-15: 70 mg via INTRAVENOUS

## 2017-10-15 MED ORDER — PROPOFOL 500 MG/50ML IV EMUL
INTRAVENOUS | Status: DC | PRN
  Administered 2017-10-15: 175 ug/kg/min via INTRAVENOUS

## 2017-10-15 MED ORDER — LIDOCAINE HCL (CARDIAC) PF 100 MG/5ML IV SOSY
PREFILLED_SYRINGE | INTRAVENOUS | Status: DC | PRN
  Administered 2017-10-15: 50 mg via INTRAVENOUS

## 2017-10-15 NOTE — Anesthesia Post-op Follow-up Note (Signed)
Anesthesia QCDR form completed.        

## 2017-10-15 NOTE — Anesthesia Postprocedure Evaluation (Signed)
Anesthesia Post Note  Patient: Suzanne Scott  Procedure(s) Performed: ESOPHAGOGASTRODUODENOSCOPY (EGD) WITH PROPOFOL (N/A ) COLONOSCOPY WITH PROPOFOL (N/A )  Patient location during evaluation: Endoscopy Anesthesia Type: General Level of consciousness: awake and alert Pain management: pain level controlled Vital Signs Assessment: post-procedure vital signs reviewed and stable Respiratory status: spontaneous breathing, nonlabored ventilation, respiratory function stable and patient connected to nasal cannula oxygen Cardiovascular status: blood pressure returned to baseline and stable Postop Assessment: no apparent nausea or vomiting Anesthetic complications: no     Last Vitals:  Vitals:   10/15/17 0924 10/15/17 1031  BP: 139/77 (!) 115/57  Pulse: (!) 59   Resp: 20   Temp: (!) 36.3 C (!) 36.1 C  SpO2: 100%     Last Pain:  Vitals:   10/15/17 1108  TempSrc:   PainSc: 0-No pain                 [name]

## 2017-10-15 NOTE — Op Note (Signed)
[facility] Gastroenterology Patient Name: Suzanne Scott Procedure Date: 10/15/2017 9:50 AM MRN: [id number] Account #: [id number] Date of Birth: Jul 19, 1960 Admit Type: Outpatient Age: 57 Room: [facility] ENDO ROOM 2 Gender: Female Note Status: Finalized Procedure:            Colonoscopy Indications:          Screening for colorectal malignant neoplasm Providers:            Lin Landsman MD, MD Medicines:            Monitored Anesthesia Care Complications:        No immediate complications. Estimated blood loss: None. Procedure:            Pre-Anesthesia Assessment:                       - Prior to the procedure, a History and Physical was                        performed, and patient medications and allergies were                        reviewed. The patient is competent. The risks and                        benefits of the procedure and the sedation options and                        risks were discussed with the patient. All questions                        were answered and informed consent was obtained.                        Patient identification and proposed procedure were                        verified by the physician, the nurse, the                        anesthesiologist, the anesthetist and the technician in                        the pre-procedure area in the procedure room in the                        endoscopy suite. Mental Status Examination: alert and                        oriented. Airway Examination: normal oropharyngeal                        airway and neck mobility. Respiratory Examination:                        clear to auscultation. CV Examination: normal.                        Prophylactic Antibiotics: The patient does not require  prophylactic antibiotics. Prior Anticoagulants: The                        patient has taken no previous anticoagulant or                        antiplatelet agents. ASA Grade  Assessment: II - A                        patient with mild systemic disease. After reviewing the                        risks and benefits, the patient was deemed in                        satisfactory condition to undergo the procedure. The                        anesthesia plan was to use monitored anesthesia care                        (MAC). Immediately prior to administration of                        medications, the patient was re-assessed for adequacy                        to receive sedatives. The heart rate, respiratory rate,                        oxygen saturations, blood pressure, adequacy of                        pulmonary ventilation, and response to care were                        monitored throughout the procedure. The physical status                        of the patient was re-assessed after the procedure.                       After obtaining informed consent, the colonoscope was                        passed under direct vision. Throughout the procedure,                        the patient's blood pressure, pulse, and oxygen                        saturations were monitored continuously. The                        Colonoscope was introduced through the anus and                        advanced to the the cecum, identified by appendiceal  orifice and ileocecal valve. The colonoscopy was                        performed without difficulty. The patient tolerated the                        procedure well. The quality of the bowel preparation                        was evaluated using the BBPS [facility] Bowel Preparation                        Scale) with scores of: Right Colon = 3, Transverse                        Colon = 3 and Left Colon = 3 (entire mucosa seen well                        with no residual staining, small fragments of stool or                        opaque liquid). The total BBPS score equals 9. Findings:      The perianal and  digital rectal examinations were normal. Pertinent       negatives include normal sphincter tone and no palpable rectal lesions.      Three sessile polyps were found in the rectum and descending colon. The       polyps were diminutive in size. These polyps were removed with a cold       biopsy forceps. Resection and retrieval were complete.      The retroflexed view of the distal rectum and anal verge was normal and       showed no anal or rectal abnormalities.      The exam was otherwise without abnormality. Impression:           - Three diminutive polyps in the rectum and in the                        descending colon, removed with a cold biopsy forceps.                        Resected and retrieved.                       - The distal rectum and anal verge are normal on                        retroflexion view.                       - The examination was otherwise normal. Recommendation:       - Discharge patient to home (with escort).                       - Resume previous diet today.                       - Continue present medications.                       -  Await pathology results.                       - Repeat colonoscopy in 5-10 years for surveillance                        based on pathology results.                       - Return to my office as previously scheduled. Procedure Code(s):    --- Professional ---                       [phone], Colonoscopy, flexible; with biopsy, single or                        multiple Diagnosis Code(s):    --- Professional ---                       Z12.11, Encounter for screening for malignant neoplasm                        of colon                       K62.1, Rectal polyp                       D12.4, Benign neoplasm of descending colon CPT copyright 2017 American [employer] Association. All rights reserved. The codes documented in this report are preliminary and upon coder review may  be revised to meet current compliance requirements. Dr.  Ulyess Mort Lin Landsman MD, MD 10/15/2017 10:28:31 AM This report has been signed electronically. Number of Addenda: 0 Note Initiated On: 10/15/2017 9:50 AM Scope Withdrawal Time: 0 hours 9 minutes 11 seconds  Total Procedure Duration: 0 hours 14 minutes 57 seconds       [facility]

## 2017-10-15 NOTE — Anesthesia Preprocedure Evaluation (Signed)
Anesthesia Evaluation  Patient identified by MRN, date of birth, ID band Patient awake    Reviewed: Allergy & Precautions, NPO status , Patient's Chart, lab work & pertinent test results, reviewed documented beta blocker date and time   Airway Mallampati: II  TM Distance: >3 FB     Dental  (+) Chipped   Pulmonary           Cardiovascular      Neuro/Psych  Headaches, PSYCHIATRIC DISORDERS Anxiety Depression    GI/Hepatic GERD  ,  Endo/Other    Renal/GU      Musculoskeletal  (+) Arthritis ,   Abdominal   Peds  Hematology   Anesthesia Other Findings   Reproductive/Obstetrics                             Anesthesia Physical Anesthesia Plan  ASA: II  Anesthesia Plan: General   Post-op Pain Management:    Induction: Intravenous  PONV Risk Score and Plan:   Airway Management Planned:   Additional Equipment:   Intra-op Plan:   Post-operative Plan:   Informed Consent: I have reviewed the patients History and Physical, chart, labs and discussed the procedure including the risks, benefits and alternatives for the proposed anesthesia with the patient or authorized representative who has indicated his/her understanding and acceptance.     Plan Discussed with: CRNA  Anesthesia Plan Comments:         Anesthesia Quick Evaluation

## 2017-10-15 NOTE — Op Note (Signed)
[facility] Gastroenterology Patient Name: Suzanne Scott Procedure Date: 10/15/2017 9:54 AM MRN: [id number] Account #: [id number] Date of Birth: July 28, 1960 Admit Type: Outpatient Age: 57 Room: [facility] ENDO ROOM 2 Gender: Female Note Status: Finalized Procedure:            Upper GI endoscopy Indications:          Heartburn Providers:            Lin Landsman MD, MD Referring MD:         Kathrine Haddock (Referring MD) Medicines:            Monitored Anesthesia Care Complications:        No immediate complications. Estimated blood loss: None. Procedure:            Pre-Anesthesia Assessment:                       - Prior to the procedure, a History and Physical was                        performed, and patient medications and allergies were                        reviewed. The patient is competent. The risks and                        benefits of the procedure and the sedation options and                        risks were discussed with the patient. All questions                        were answered and informed consent was obtained.                        Patient identification and proposed procedure were                        verified by the physician, the nurse, the                        anesthesiologist, the anesthetist and the technician in                        the pre-procedure area in the procedure room in the                        endoscopy suite. Mental Status Examination: alert and                        oriented. Airway Examination: normal oropharyngeal                        airway and neck mobility. Respiratory Examination:                        clear to auscultation. CV Examination: normal.                        Prophylactic Antibiotics: The patient does not require  prophylactic antibiotics. Prior Anticoagulants: The                        patient has taken no previous anticoagulant or                        antiplatelet  agents. ASA Grade Assessment: II - A                        patient with mild systemic disease. After reviewing the                        risks and benefits, the patient was deemed in                        satisfactory condition to undergo the procedure. The                        anesthesia plan was to use monitored anesthesia care                        (MAC). Immediately prior to administration of                        medications, the patient was re-assessed for adequacy                        to receive sedatives. The heart rate, respiratory rate,                        oxygen saturations, blood pressure, adequacy of                        pulmonary ventilation, and response to care were                        monitored throughout the procedure. The physical status                        of the patient was re-assessed after the procedure.                       After obtaining informed consent, the endoscope was                        passed under direct vision. Throughout the procedure,                        the patient's blood pressure, pulse, and oxygen                        saturations were monitored continuously. The Endoscope                        was introduced through the mouth, and advanced to the                        second part of duodenum. The upper GI endoscopy was  accomplished without difficulty. The patient tolerated                        the procedure well. Findings:      The duodenal bulb and second portion of the duodenum were normal.      The entire examined stomach was normal.      The cardia and gastric fundus were normal on retroflexion.      The gastroesophageal junction and examined esophagus were normal. Impression:           - Normal duodenal bulb and second portion of the                        duodenum.                       - Normal stomach.                       - Normal gastroesophageal junction and esophagus.                        - No specimens collected. Recommendation:       - Follow an antireflux regimen.                       - Use a proton pump inhibitor PO daily for 3 months.                       - Proceed with colonoscopy as scheduled                       See colonoscopy report Procedure Code(s):    --- Professional ---                       [id number], Esophagogastroduodenoscopy, flexible, transoral;                        diagnostic, including collection of specimen(s) by                        brushing or washing, when performed (separate procedure) Diagnosis Code(s):    --- Professional ---                       R12, Heartburn CPT copyright 2017 American Medical Association. All rights reserved. The codes documented in this report are preliminary and upon coder review may  be revised to meet current compliance requirements. Dr. Ulyess Mort Lin Landsman MD, MD 10/15/2017 10:09:10 AM This report has been signed electronically. Number of Addenda: 0 Note Initiated On: 10/15/2017 9:54 AM      [facility]

## 2017-10-15 NOTE — H&P (Signed)
Cephas Darby, MD [address]  [place]  [place], [place] [address]  Main: [phone]  Fax: [phone] Pager: [phone]  Primary Care Physician:  Guadalupe Maple, MD Primary Gastroenterologist:  Dr. Cephas Darby  Pre-Procedure History & Physical: HPI:  Suzanne Scott is a 57 y.o. female is here for an endoscopy and colonoscopy.   Past Medical History:  Diagnosis Date  . Anxiety   . Atrophic vaginitis   . Hyperlipidemia     Past Surgical History:  Procedure Laterality Date  . BREAST BIOPSY Left 04/08/2015   negative    Prior to Admission medications   Medication Sig Start Date End Date Taking? Authorizing Provider  citalopram (CELEXA) 20 MG tablet TK 1 T PO D 09/17/17  Yes Carles Collet M, PA-C  omeprazole (PRILOSEC) 40 MG capsule Take 1 capsule (40 mg total) by mouth daily before breakfast. 09/14/17 12/13/17 Yes Mearle Drew, Tally Due, MD    Allergies as of 09/14/2017  . (No Known Allergies)    Family History  Problem Relation Age of Onset  . Arthritis Mother   . Heart disease Mother   . Breast cancer Neg Hx     Social History   Socioeconomic History  . Marital status: Married    Spouse name: Not on file  . Number of children: 3  . Years of education: 59  . Highest education level: Not on file  Occupational History  . Not on file  Social Needs  . Financial resource strain: Not on file  . Food insecurity:    Worry: Not on file    Inability: Not on file  . Transportation needs:    Medical: Not on file    Non-medical: Not on file  Tobacco Use  . Smoking status: Never Smoker  . Smokeless tobacco: Never Used  Substance and Sexual Activity  . Alcohol use: Yes    Alcohol/week: 0.0 standard drinks    Comment: pt states she drinks wine on occassion  . Drug use: No  . Sexual activity: Yes  Lifestyle  . Physical activity:    Days per week: Not on file    Minutes per session: Not on file  . Stress: Not on file  Relationships  . Social  connections:    Talks on phone: Not on file    Gets together: Not on file    Attends religious service: Not on file    Active member of club or organization: Not on file    Attends meetings of clubs or organizations: Not on file    Relationship status: Not on file  . Intimate partner violence:    Fear of current or ex partner: Not on file    Emotionally abused: Not on file    Physically abused: Not on file    Forced sexual activity: Not on file  Other Topics Concern  . Not on file  Social History Narrative  . Not on file    Review of Systems: See HPI, otherwise negative ROS  Physical Exam: BP 139/77   Pulse (!) 59   Temp (!) 97.3 F (36.3 C) (Tympanic)   Resp 20   Ht 5\' 9"  (1.753 m)   Wt 76.2 kg   LMP  (LMP Unknown)   SpO2 100%   BMI 24.81 kg/m  General:   Alert,  pleasant and cooperative in NAD Head:  Normocephalic and atraumatic. Neck:  Supple; no masses or thyromegaly. Lungs:  Clear throughout to auscultation.  Heart:  Regular rate and rhythm. Abdomen:  Soft, nontender and nondistended. Normal bowel sounds, without guarding, and without rebound.   Neurologic:  Alert and  oriented x4;  grossly normal neurologically.  Impression/Plan: [name] is here for an endoscopy and colonoscopy to be performed for GERD and colon cancer screening  Risks, benefits, limitations, and alternatives regarding  endoscopy and colonoscopy have been reviewed with the patient.  Questions have been answered.  All parties agreeable.   Sherri Sear, MD  10/15/2017, 9:50 AM

## 2017-10-15 NOTE — Transfer of Care (Signed)
Immediate Anesthesia Transfer of Care Note  Patient: [place]  Procedure(s) Performed: ESOPHAGOGASTRODUODENOSCOPY (EGD) WITH PROPOFOL (N/A ) COLONOSCOPY WITH PROPOFOL (N/A )  Patient Location: PACU  Anesthesia Type:General  Level of Consciousness: sedated  Airway & Oxygen Therapy: Patient Spontanous Breathing and Patient connected to nasal cannula oxygen  Post-op Assessment: Report given to RN and Post -op Vital signs reviewed and stable  Post vital signs: Reviewed and stable  Last Vitals:  Vitals Value Taken Time  BP 115/57 10/15/2017 10:31 AM  Temp 36.1 C 10/15/2017 10:31 AM  Pulse 67 10/15/2017 10:33 AM  Resp 15 10/15/2017 10:33 AM  SpO2 100 % 10/15/2017 10:33 AM  Vitals shown include unvalidated device data.  Last Pain:  Vitals:   10/15/17 1031  TempSrc: Tympanic  PainSc: 0-No pain         Complications: No apparent anesthesia complications

## 2017-10-15 NOTE — Anesthesia Procedure Notes (Signed)
Date/Time: 10/15/2017 9:58 AM Performed by: Johnna Acosta, CRNA Pre-anesthesia Checklist: Patient identified, Emergency Drugs available, Suction available, Patient being monitored and Timeout performed Patient Re-evaluated:Patient Re-evaluated prior to induction Oxygen Delivery Method: Nasal cannula Preoxygenation: Pre-oxygenation with 100% oxygen

## 2017-10-16 ENCOUNTER — Encounter: Payer: Self-pay | Admitting: Gastroenterology

## 2017-10-17 LAB — SURGICAL PATHOLOGY

## 2017-10-19 ENCOUNTER — Encounter: Payer: Self-pay | Admitting: Gastroenterology

## 2017-10-24 ENCOUNTER — Other Ambulatory Visit: Payer: Self-pay | Admitting: Unknown Physician Specialty

## 2017-10-24 DIAGNOSIS — Z1231 Encounter for screening mammogram for malignant neoplasm of breast: Secondary | ICD-10-CM

## 2017-11-13 ENCOUNTER — Ambulatory Visit
Admission: RE | Admit: 2017-11-13 | Discharge: 2017-11-13 | Disposition: A | Discharge status: Home / Self Care | Payer: Commercial Managed Care - PPO | Source: Ambulatory Visit | Attending: Unknown Physician Specialty | Admitting: Unknown Physician Specialty

## 2017-11-13 DIAGNOSIS — Z1231 Encounter for screening mammogram for malignant neoplasm of breast: Secondary | ICD-10-CM | POA: Insufficient documentation

## 2017-12-10 ENCOUNTER — Other Ambulatory Visit: Payer: Self-pay | Admitting: Physician Assistant

## 2017-12-10 DIAGNOSIS — F419 Anxiety disorder, unspecified: Secondary | ICD-10-CM

## 2017-12-11 ENCOUNTER — Other Ambulatory Visit: Payer: Self-pay | Admitting: Gastroenterology

## 2017-12-11 DIAGNOSIS — K219 Gastro-esophageal reflux disease without esophagitis: Secondary | ICD-10-CM

## 2017-12-11 NOTE — Telephone Encounter (Signed)
Refill request approved

## 2017-12-17 ENCOUNTER — Ambulatory Visit (INDEPENDENT_AMBULATORY_CARE_PROVIDER_SITE_OTHER): Payer: Commercial Managed Care - PPO | Admitting: Nurse Practitioner

## 2017-12-17 ENCOUNTER — Other Ambulatory Visit: Payer: Self-pay

## 2017-12-17 ENCOUNTER — Encounter: Payer: Self-pay | Admitting: Nurse Practitioner

## 2017-12-17 VITALS — BP 117/73 | HR 71 | Temp 98.3°F | Ht 68.5 in | Wt 173.0 lb

## 2017-12-17 DIAGNOSIS — M12812 Other specific arthropathies, not elsewhere classified, left shoulder: Secondary | ICD-10-CM | POA: Diagnosis not present

## 2017-12-17 DIAGNOSIS — G629 Polyneuropathy, unspecified: Secondary | ICD-10-CM | POA: Insufficient documentation

## 2017-12-17 NOTE — Progress Notes (Signed)
BP 117/73   Pulse 71   Temp 98.3 F (36.8 C) (Oral)   Ht 5' 8.5" (1.74 m)   Wt 173 lb (78.5 kg)   LMP  (LMP Unknown)   SpO2 98%   BMI 25.92 kg/m    Subjective:    Patient ID: Suzanne Scott, female    DOB: 06-17-1960, 57 y.o.   MRN: [id number]  HPI: Suzanne Scott is a 57 y.o. female presents for ongoing foot and shoulder pain.  Chief Complaint  Patient presents with  . Foot Pain    right foot pain and burning under the toes x 1 weeks  . Shoulder Pain    left side pain radiating to the back x 3 weeks   FOOT PAIN, RIGHT: Diagnosed with plantar fascitis last year.  This past year she has had burning in the right foot under her toes and then over past week she has noticed pain is radiating up to lateral aspect lower leg to knee.  8/10 at the worst, noticed more when on her feet.  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  Also notices pain when she crosses her legs.  She reports Voltaren gel rubbed on area at night helps the pain.  Notices the burning/aching is not worse at day or night, is consistent during both hours.  SHOULDER PAIN, LEFT: Has been presence since August.  Does a lot of pulling and pushing at her job as Quarry manager at [employer].  She works on the rehab side.  Pain is constant, when she lifts she has to bend her arm at elbow and keep it close to.  When she is laying down notices, does not matter what side she is laying on.  At worst pain to shoulder pain 7-8/10.  Has tried Aleeve & Tylenol.  Notices pain is worse when using Sarah lift at work as has to move arms in outward position.  Relevant past medical, surgical, family and social history reviewed and updated as indicated. Interim medical history since our last visit reviewed. Allergies and medications reviewed and updated.  Review of Systems  Constitutional: Positive for fatigue. Negative for activity change, appetite change, diaphoresis and fever.  Respiratory: Negative for cough, chest  tightness, shortness of breath and wheezing.   Cardiovascular: Negative for chest pain, palpitations and leg swelling.  Gastrointestinal: Negative for abdominal distention, abdominal pain, constipation, diarrhea, nausea and vomiting.  Endocrine: Negative for cold intolerance, heat intolerance, polydipsia, polyphagia and polyuria.  Musculoskeletal: Positive for arthralgias. Negative for back pain, joint swelling, neck pain and neck stiffness.  Skin: Negative.   Allergic/Immunologic: Negative.   Neurological: Positive for numbness. Negative for dizziness, tremors, syncope, weakness, light-headedness and headaches.  Hematological: Negative.   Psychiatric/Behavioral: Negative.    Per HPI unless specifically indicated above     Objective:    BP 117/73   Pulse 71   Temp 98.3 F (36.8 C) (Oral)   Ht 5' 8.5" (1.74 m)   Wt 173 lb (78.5 kg)   LMP  (LMP Unknown)   SpO2 98%   BMI 25.92 kg/m   Wt Readings from Last 3 Encounters:  12/17/17 173 lb (78.5 kg)  10/15/17 168 lb (76.2 kg)  09/17/17 167 lb 6.4 oz (75.9 kg)    Physical Exam  Constitutional: She is oriented to person, place, and time. She appears well-developed and well-nourished.  HENT:  Head: Normocephalic.  Eyes: Pupils are equal, round, and reactive to  light. Conjunctivae and EOM are normal. Right eye exhibits no discharge. Left eye exhibits no discharge.  Neck: Normal range of motion. Neck supple. No JVD present. Carotid bruit is not present. No thyromegaly present.  Cardiovascular: Normal rate, regular rhythm and normal heart sounds.  Pulmonary/Chest: Effort normal and breath sounds normal.  Abdominal: Soft. Bowel sounds are normal.  Musculoskeletal:       Right shoulder: She exhibits normal range of motion, no tenderness, no swelling, no pain and normal strength.       Left shoulder: She exhibits tenderness and decreased strength. She exhibits normal range of motion and no swelling.  Positive empty can with decreased  strength LUE.  Full ROM BLE.  Lymphadenopathy:    She has no cervical adenopathy.  Neurological: She is alert and oriented to person, place, and time.  Skin: Skin is warm and dry.  No rash noted left or right shoulders or chest.    Psychiatric: She has a normal mood and affect. Her behavior is normal. Judgment and thought content normal.  Nursing note and vitals reviewed.  Diabetic Foot Exam - Simple   Simple Foot Form Visual Inspection No deformities, no ulcerations, no other skin breakdown bilaterally:  Yes Sensation Testing See comments:  Yes Pulse Check Posterior Tibialis and Dorsalis pulse intact bilaterally:  Yes Comments Bilateral feet with decreased sensation R>L.  Right foot 6/10 points and left foot 8/10 points.     Results for orders placed or performed during the hospital encounter of 10/15/17  Surgical pathology  Result Value Ref Range   SURGICAL PATHOLOGY      Surgical Pathology CASE: [id number] PATIENT: Suzanne Scott Surgical Pathology Report     SPECIMEN SUBMITTED: A. Colon polyp, descending; cbx B. Rectum polyp x2; cbx  CLINICAL HISTORY: None provided  PRE-OPERATIVE DIAGNOSIS: Gastroesophageal reflux disease esophagitis presence not specified K21.9, colon cancer screening Z12.11  POST-OPERATIVE DIAGNOSIS: Normal upper endoscopy; colon polyp     DIAGNOSIS: A. COLON POLYP, DESCENDING; COLD BIOPSY: - HYPERPLASTIC POLYP. - NEGATIVE FOR DYSPLASIA AND MALIGNANCY.  B.  RECTUM POLYP X2; COLD BIOPSY: - COLONIC MUCOSA WITH PROMINENT LYMPHOID AGGREGATE (1). - HYPERPLASTIC POLYP (1). - NEGATIVE FOR DYSPLASIA AND MALIGNANCY.   GROSS DESCRIPTION: A. Labeled: Cbx polyp descending colon Received: In formalin Tissue fragment(s): 1 Size: Less than 0.1 cm Description: Tan fragment Entirely submitted in one cassette.  B. Labeled: Cbx polyp rectum x2 Received: In formalin Tissue fragment(s): 2 Size: 0.3-0.4  cm Description: Tan  fragments Entirely submitted in one cassette.   Final Diagnosis performed by Quay Burow, MD.   Electronically signed 10/17/2017 9:56:06AM The electronic signature indicates that the named Attending Pathologist has evaluated the specimen  Technical component performed at [facility], [address] [place], [place] [address] Lab: [phone] Dir: Rush Farmer, MD, MMM  Professional component performed at [facility], [facility], [place], [place], [place] [address] Lab: [phone] Dir: Dellia Nims. Reuel Derby, MD       Assessment & Plan:   Problem List Items Addressed This Visit      Nervous and Auditory   Neuropathy    Noted more to left foot vs right foot.  Would like to avoid medication at this time.  Obtain A1C and B12 level today.      Relevant Orders   HgB A1c   B12     Musculoskeletal and Integument   Rotator cuff arthropathy of left shoulder    Ongoing, worsening.  Ortho and PT consult placed.  Relevant Orders   Ambulatory referral to Orthopedic Surgery   Ambulatory referral to Physical Therapy       Follow up plan: Return in about 3 months (around 03/19/2018) for Neuropathy .

## 2017-12-17 NOTE — Assessment & Plan Note (Signed)
Noted more to left foot vs right foot.  Would like to avoid medication at this time.  Obtain A1C and B12 level today.

## 2017-12-17 NOTE — Assessment & Plan Note (Addendum)
Ongoing, worsening.  Ortho and PT consult placed.

## 2017-12-17 NOTE — Patient Instructions (Signed)
Rotator Cuff Tendinitis Rotator cuff tendinitis is inflammation of the tough, cord-like bands that connect muscle to bone (tendons) in the rotator cuff. The rotator cuff includes all of the muscles and tendons that connect the arm to the shoulder. The rotator cuff holds the head of the upper arm bone (humerus) in the cup (fossa) of the shoulder blade (scapula). This condition can lead to a long-lasting (chronic) tear. The tear may be partial or complete. What are the causes? This condition is usually caused by overusing the rotator cuff. What increases the risk? This condition is more likely to develop in athletes and workers who frequently use their shoulder or reach over their heads. This can include activities such as:  Tennis.  Baseball or softball.  Swimming.  Construction work.  Painting.  What are the signs or symptoms? Symptoms of this condition include:  Pain spreading (radiating) from the shoulder to the upper arm.  Swelling and tenderness in front of the shoulder.  Pain when reaching, pulling, or lifting the arm above the head.  Pain when lowering the arm from above the head.  Minor pain in the shoulder when resting.  Increased pain in the shoulder at night.  Difficulty placing the arm behind the back.  How is this diagnosed? This condition is diagnosed with a medical history and physical exam. Tests may also be done, including:  X-rays.  MRI.  Ultrasounds.  CT or MR arthrogram. During this test, a contrast material is injected and then images are taken.  How is this treated? Treatment for this condition depends on the severity of the condition. In less severe cases, treatment may include:  Rest. This may be done with a sling that holds the shoulder still (immobilization). Your health care provider may also recommend avoiding activities that involve lifting your arm over your head.  Icing the shoulder.  Anti-inflammatory medicines, such as aspirin or  ibuprofen.  In more severe cases, treatment may include:  Physical therapy.  Steroid injections.  Surgery.  Follow these instructions at home: If you have a sling:  Wear the sling as told by your health care provider. Remove it only as told by your health care provider.  Loosen the sling if your fingers tingle, become numb, or turn cold and blue.  Keep the sling clean.  If the sling is not waterproof, do not let it get wet. Remove it, if allowed, or cover it with a watertight covering when you take a bath or shower. Managing pain, stiffness, and swelling  If directed, put ice on the injured area. ? If you have a removable sling, remove it as told by your health care provider. ? Put ice in a plastic bag. ? Place a towel between your skin and the bag. ? Leave the ice on for 20 minutes, 2-3 times a day.  Move your fingers often to avoid stiffness and to lessen swelling.  Raise (elevate) the injured area above the level of your heart while you are lying down.  Find a comfortable sleeping position or sleep on a recliner, if available. Driving  Do not drive or use heavy machinery while taking prescription pain medicine.  Ask your health care provider when it is safe to drive if you have a sling on your arm. Activity  Rest your shoulder as told by your health care provider.  Return to your normal activities as told by your health care provider. Ask your health care provider what activities are safe for you.  Do any   exercises or stretches as told by your health care provider.  If you do repetitive overhead tasks, take small breaks in between and include stretching exercises as told by your health care provider. General instructions  Do not use any products that contain nicotine or tobacco, such as cigarettes and e-cigarettes. These can delay healing. If you need help quitting, ask your health care provider.  Take over-the-counter and prescription medicines only as told by  your health care provider.  Keep all follow-up visits as told by your health care provider. This is important. Contact a health care provider if:  Your pain gets worse.  You have new pain in your arm, hands, or fingers.  Your pain is not relieved with medicine or does not get better after 6 weeks of treatment.  You have cracking sensations when moving your shoulder in certain directions.  You hear a snapping sound after using your shoulder, followed by severe pain and weakness. Get help right away if:  Your arm, hand, or fingers are numb or tingling.  Your arm, hand, or fingers are swollen or painful or they turn white or blue. Summary  Rotator cuff tendinitis is inflammation of the tough, cord-like bands that connect muscle to bone (tendons) in the rotator cuff.  This condition is usually caused by overusing the rotator cuff, which includes all of the muscles and tendons that connect the arm to the shoulder.  This condition is more likely to develop in athletes and workers who frequently use their shoulder or reach over their heads.  Treatment generally includes rest, anti-inflammatory medicines, and icing. In some cases, physical therapy and steroid injections may be needed. In severe cases, surgery may be needed. This information is not intended to replace advice given to you by your health care provider. Make sure you discuss any questions you have with your health care provider. Document Released: 04/08/2003 Document Revised: 01/03/2016 Document Reviewed: 01/03/2016 Elsevier Interactive Patient Education  2017 [employer] Peripheral Neuropathy Peripheral neuropathy is a type of nerve damage. It affects nerves that carry signals between the spinal cord and other parts of the body. These are called peripheral nerves. With peripheral neuropathy, one nerve or a group of nerves may be damaged. What are the causes? Many things can damage peripheral nerves. For some people with  peripheral neuropathy, the cause is unknown. Some causes include:  Diabetes. This is the most common cause of peripheral neuropathy.  Injury to a nerve.  Pressure or stress on a nerve that lasts a long time.  Too little vitamin B. Alcoholism can lead to this.  Infections.  Autoimmune diseases, such as multiple sclerosis and systemic lupus erythematosus.  Inherited nerve diseases.  Some medicines, such as cancer drugs.  Toxic substances, such as lead and mercury.  Too little blood flowing to the legs.  Kidney disease.  Thyroid disease.  What are the signs or symptoms? Different people have different symptoms. The symptoms you have will depend on which of your nerves is damaged. Common symptoms include:  Loss of feeling (numbness) in the feet and hands.  Tingling in the feet and hands.  Pain that burns.  Very sensitive skin.  Weakness.  Not being able to move a part of the body (paralysis).  Muscle twitching.  Clumsiness or poor coordination.  Loss of balance.  Not being able to control your bladder.  Feeling dizzy.  Sexual problems.  How is this diagnosed? Peripheral neuropathy is a symptom, not a disease. Finding the cause of peripheral  neuropathy can be hard. To figure that out, your health care provider will take a medical history and do a physical exam. A neurological exam will also be done. This involves checking things affected by your brain, spinal cord, and nerves (nervous system). For example, your health care provider will check your reflexes, how you move, and what you can feel. Other types of tests may also be ordered, such as:  Blood tests.  A test of the fluid in your spinal cord.  Imaging tests, such as CT scans or an MRI.  Electromyography (EMG). This test checks the nerves that control muscles.  Nerve conduction velocity tests. These tests check how fast messages pass through your nerves.  Nerve biopsy. A small piece of nerve is  removed. It is then checked under a microscope.  How is this treated?  Medicine is often used to treat peripheral neuropathy. Medicines may include: ? Pain-relieving medicines. Prescription or over-the-counter medicine may be suggested. ? Antiseizure medicine. This may be used for pain. ? Antidepressants. These also may help ease pain from neuropathy. ? Lidocaine. This is a numbing medicine. You might wear a patch or be given a shot. ? Mexiletine. This medicine is typically used to help control irregular heart rhythms.  Surgery. Surgery may be needed to relieve pressure on a nerve or to destroy a nerve that is causing pain.  Physical therapy to help movement.  Assistive devices to help movement. Follow these instructions at home:  Only take over-the-counter or prescription medicines as directed by your health care provider. Follow the instructions carefully for any given medicines. Do not take any other medicines without first getting approval from your health care provider.  If you have diabetes, work closely with your health care provider to keep your blood sugar under control.  If you have numbness in your feet: ? Check every day for signs of injury or infection. Watch for redness, warmth, and swelling. ? Wear padded socks and comfortable shoes. These help protect your feet.  Do not do things that put pressure on your damaged nerve.  Do not smoke. Smoking keeps blood from getting to damaged nerves.  Avoid or limit alcohol. Too much alcohol can cause a lack of B vitamins. These vitamins are needed for healthy nerves.  Develop a good support system. Coping with peripheral neuropathy can be stressful. Talk to a mental health specialist or join a support group if you are struggling.  Follow up with your health care provider as directed. Contact a health care provider if:  You have new signs or symptoms of peripheral neuropathy.  You are struggling emotionally from dealing with  peripheral neuropathy.  You have a fever. Get help right away if:  You have an injury or infection that is not healing.  You feel very dizzy or begin vomiting.  You have chest pain.  You have trouble breathing. This information is not intended to replace advice given to you by your health care provider. Make sure you discuss any questions you have with your health care provider. Document Released: 01/06/2002 Document Revised: 06/24/2015 Document Reviewed: 09/23/2012 Elsevier Interactive Patient Education  2017 [employer].

## 2017-12-18 ENCOUNTER — Encounter: Payer: Self-pay | Admitting: Nurse Practitioner

## 2017-12-18 LAB — HEMOGLOBIN A1C
ESTIMATED AVERAGE GLUCOSE: 123 mg/dL
Hgb A1c MFr Bld: 5.9 % — ABNORMAL HIGH (ref 4.8–5.6)

## 2017-12-18 LAB — VITAMIN B12: VITAMIN B 12: 1322 pg/mL — AB (ref 232–1245)

## 2018-01-02 ENCOUNTER — Ambulatory Visit
Admission: RE | Admit: 2018-01-02 | Discharge: 2018-01-02 | Disposition: A | Discharge status: Home / Self Care | Payer: Commercial Managed Care - PPO | Source: Ambulatory Visit | Attending: Family Medicine | Admitting: Family Medicine

## 2018-01-02 ENCOUNTER — Encounter: Payer: Self-pay | Admitting: Family Medicine

## 2018-01-02 ENCOUNTER — Ambulatory Visit (INDEPENDENT_AMBULATORY_CARE_PROVIDER_SITE_OTHER): Payer: Commercial Managed Care - PPO | Admitting: Family Medicine

## 2018-01-02 VITALS — BP 134/78 | HR 66 | Temp 97.9°F | Wt 168.0 lb

## 2018-01-02 DIAGNOSIS — M25512 Pain in left shoulder: Secondary | ICD-10-CM

## 2018-01-02 DIAGNOSIS — M899 Disorder of bone, unspecified: Secondary | ICD-10-CM

## 2018-01-02 DIAGNOSIS — R918 Other nonspecific abnormal finding of lung field: Secondary | ICD-10-CM | POA: Insufficient documentation

## 2018-01-02 NOTE — Progress Notes (Signed)
   BP 134/78   Pulse 66   Temp 97.9 F (36.6 C) (Oral)   Wt 168 lb (76.2 kg)   LMP  (LMP Unknown)   SpO2 99%   BMI 25.17 kg/m    Subjective:    Patient ID: Suzanne Scott, female    DOB: May 28, 1960, 57 y.o.   MRN: [id number]  HPI: Suzanne Scott is a 57 y.o. female  Chief Complaint  Patient presents with  . Abnormal result    Left shoulder xray showed "Indeterminate density near the left first reb. Recommend a dedicated 2 view chest exary to evaluate the left apical region   Here today for follow up imaging for an incidental left rib lesion noted on left shoulder x-ray last month. Not having any rib pain, no known injury to area. CXR 06/2017 without note of rib lesion. CXR recommended by radiology for f/u but will also get left rib film for closer look. Shoulder pain slowly improving following instructions per UC. Trying to rest as much as possible.   Relevant past medical, surgical, family and social history reviewed and updated as indicated. Interim medical history since our last visit reviewed. Allergies and medications reviewed and updated.  Review of Systems  Per HPI unless specifically indicated above     Objective:    BP 134/78   Pulse 66   Temp 97.9 F (36.6 C) (Oral)   Wt 168 lb (76.2 kg)   LMP  (LMP Unknown)   SpO2 99%   BMI 25.17 kg/m   Wt Readings from Last 3 Encounters:  01/02/18 168 lb (76.2 kg)  12/17/17 173 lb (78.5 kg)  10/15/17 168 lb (76.2 kg)    Physical Exam  Constitutional: She is oriented to person, place, and time. She appears well-developed and well-nourished. No distress.  HENT:  Head: Atraumatic.  Eyes: Conjunctivae and EOM are normal.  Neck: Normal range of motion. Neck supple.  Cardiovascular: Normal rate, regular rhythm and normal heart sounds.  Pulmonary/Chest: Effort normal and breath sounds normal. She has no wheezes.  No chest wall ttp   Musculoskeletal: Normal range of motion. She exhibits no edema, tenderness or  deformity.  Neurological: She is alert and oriented to person, place, and time.  Skin: Skin is warm and dry.  Psychiatric: She has a normal mood and affect. Her behavior is normal.  Nursing note and vitals reviewed.   Results for orders placed or performed in visit on 12/17/17  HgB A1c  Result Value Ref Range   Hgb A1c MFr Bld 5.9 (H) 4.8 - 5.6 %   Est. average glucose Bld gHb Est-mCnc 123 mg/dL  B12  Result Value Ref Range   Vitamin B-12 1,322 (H) 232 - 1,245 pg/mL      Assessment & Plan:   Problem List Items Addressed This Visit    None    Visit Diagnoses    Rib lesion    -  Primary   CXR and rib films ordered, will refer to orthopedics if abnormal.    Relevant Orders   DG Chest 2 View   DG Ribs Unilateral Left   Acute pain of left shoulder       Continue rest, stretches, massage, and medication per UC recommendation. Refer to orthopedics if not improving       Follow up plan: Return for as scheduled.

## 2018-01-10 ENCOUNTER — Ambulatory Visit: Payer: Commercial Managed Care - PPO | Admitting: Physical Therapy

## 2018-01-15 ENCOUNTER — Ambulatory Visit: Payer: Commercial Managed Care - PPO | Admitting: Podiatry

## 2018-01-15 ENCOUNTER — Encounter

## 2018-01-15 ENCOUNTER — Ambulatory Visit: Payer: Commercial Managed Care - PPO | Admitting: Physical Therapy

## 2018-01-17 ENCOUNTER — Encounter: Payer: Commercial Managed Care - PPO | Admitting: Physical Therapy

## 2018-01-24 ENCOUNTER — Encounter: Payer: Commercial Managed Care - PPO | Admitting: Physical Therapy

## 2018-01-28 ENCOUNTER — Encounter: Payer: Commercial Managed Care - PPO | Admitting: Physical Therapy

## 2018-01-28 ENCOUNTER — Telehealth: Payer: Self-pay | Admitting: Nurse Practitioner

## 2018-01-28 DIAGNOSIS — R768 Other specified abnormal immunological findings in serum: Secondary | ICD-10-CM

## 2018-01-28 NOTE — Telephone Encounter (Signed)
[facility] reports labs RNP antibody and Lupus labs elevated.  Requesting provider placed referral to rheumatology.  Referral placed.

## 2018-01-31 ENCOUNTER — Encounter: Payer: Commercial Managed Care - PPO | Admitting: Physical Therapy

## 2018-02-04 ENCOUNTER — Encounter: Payer: Commercial Managed Care - PPO | Admitting: Physical Therapy

## 2018-02-05 ENCOUNTER — Encounter: Payer: Commercial Managed Care - PPO | Admitting: Podiatry

## 2018-02-06 ENCOUNTER — Encounter: Payer: Commercial Managed Care - PPO | Admitting: Physical Therapy

## 2018-02-11 ENCOUNTER — Encounter: Payer: Commercial Managed Care - PPO | Admitting: Physical Therapy

## 2018-02-12 NOTE — Progress Notes (Signed)
This encounter was created in error - please disregard.

## 2018-02-13 ENCOUNTER — Encounter: Payer: Commercial Managed Care - PPO | Admitting: Physical Therapy

## 2018-02-13 DIAGNOSIS — R768 Other specified abnormal immunological findings in serum: Secondary | ICD-10-CM | POA: Insufficient documentation

## 2018-02-13 DIAGNOSIS — H209 Unspecified iridocyclitis: Secondary | ICD-10-CM | POA: Insufficient documentation

## 2018-02-18 ENCOUNTER — Encounter: Payer: Commercial Managed Care - PPO | Admitting: Physical Therapy

## 2018-02-20 ENCOUNTER — Encounter: Payer: Commercial Managed Care - PPO | Admitting: Physical Therapy

## 2018-02-25 ENCOUNTER — Encounter: Payer: Commercial Managed Care - PPO | Admitting: Physical Therapy

## 2018-02-27 ENCOUNTER — Encounter: Payer: Commercial Managed Care - PPO | Admitting: Physical Therapy

## 2018-03-10 ENCOUNTER — Encounter: Payer: Self-pay | Admitting: Nurse Practitioner

## 2018-03-10 DIAGNOSIS — R7303 Prediabetes: Secondary | ICD-10-CM | POA: Insufficient documentation

## 2018-03-11 ENCOUNTER — Other Ambulatory Visit: Payer: Self-pay | Admitting: Gastroenterology

## 2018-03-11 ENCOUNTER — Ambulatory Visit: Payer: Commercial Managed Care - PPO | Admitting: Nurse Practitioner

## 2018-03-11 DIAGNOSIS — K219 Gastro-esophageal reflux disease without esophagitis: Secondary | ICD-10-CM

## 2018-03-18 ENCOUNTER — Other Ambulatory Visit: Payer: Self-pay | Admitting: Nurse Practitioner

## 2018-03-18 DIAGNOSIS — F419 Anxiety disorder, unspecified: Secondary | ICD-10-CM

## 2018-04-09 ENCOUNTER — Ambulatory Visit: Payer: Commercial Managed Care - PPO | Admitting: Nurse Practitioner

## 2018-04-16 ENCOUNTER — Encounter: Payer: Self-pay | Admitting: Nurse Practitioner

## 2018-04-16 ENCOUNTER — Ambulatory Visit (INDEPENDENT_AMBULATORY_CARE_PROVIDER_SITE_OTHER): Payer: Commercial Managed Care - PPO | Admitting: Nurse Practitioner

## 2018-04-16 ENCOUNTER — Other Ambulatory Visit: Payer: Self-pay

## 2018-04-16 VITALS — BP 115/70 | HR 68 | Temp 98.3°F | Ht 69.0 in | Wt 171.0 lb

## 2018-04-16 DIAGNOSIS — F419 Anxiety disorder, unspecified: Secondary | ICD-10-CM

## 2018-04-16 DIAGNOSIS — G629 Polyneuropathy, unspecified: Secondary | ICD-10-CM | POA: Diagnosis not present

## 2018-04-16 NOTE — Assessment & Plan Note (Signed)
Chronic, ongoing.  Continue to collaborate with orthopedic provider.

## 2018-04-16 NOTE — Assessment & Plan Note (Signed)
Chronic, stable.  Continue current medication regimen with Celexa.

## 2018-04-16 NOTE — Progress Notes (Signed)
BP 115/70   Pulse 68   Temp 98.3 F (36.8 C) (Oral)   Ht 5\' 9"  (1.753 m)   Wt 171 lb (77.6 kg)   LMP  (LMP Unknown)   SpO2 98%   BMI 25.25 kg/m    Subjective:    Patient ID: Suzanne Scott, female    DOB: Dec 29, 1960, 58 y.o.   MRN: [id number]  HPI: Suzanne Scott is a 58 y.o. female  Chief Complaint  Patient presents with  . Anxiety    43m f/u  . Foot Pain    35m f/u right foot per patient   ANXIETY/STRESS Currently taking Celexa, states she feels her mood is under control.   Duration:stable Anxious mood: yes  Excessive worrying: yes Irritability: yes  Sweating: no Nausea: no Palpitations:no Hyperventilation: no Panic attacks: no Agoraphobia: no  Obscessions/compulsions: no Depressed mood: no Depression screen [facility] 2/9 04/16/2018 09/17/2017 07/31/2017 01/26/2017 02/09/2016  Decreased Interest 0 0 0 1 0  Down, Depressed, Hopeless 0 0 0 1 0  PHQ - 2 Score 0 0 0 2 0  Altered sleeping 1 3 0 1 3  Tired, decreased energy 0 2 0 1 1  Change in appetite 1 2 1  0 1  Feeling bad or failure about yourself  0 0 0 0 0  Trouble concentrating 1 3 2 3 1   Moving slowly or fidgety/restless 0 0 0 0 0  Suicidal thoughts 0 0 0 0 0  PHQ-9 Score 3 10 3 7 6   Difficult doing work/chores Not difficult at all - - - -   Anhedonia: no Weight changes: no Insomnia: yes hard to stay asleep  Hypersomnia: no Fatigue/loss of energy: no Feelings of worthlessness: no Feelings of guilt: no Impaired concentration/indecisiveness: no Suicidal ideations: no  Crying spells: no Recent Stressors/Life Changes: no   Relationship problems: no   Family stress: no     Financial stress: no    Job stress: no    Recent death/loss: no  GAD 7 : Generalized Anxiety Score 04/16/2018  Nervous, Anxious, on Edge 0  Control/stop worrying 1  Worry too much - different things 1  Trouble relaxing 1  Restless 1  Easily annoyed or irritable 1  Afraid - awful might happen 0  Total GAD 7 Score 5  Anxiety  Difficulty Somewhat difficult   FOOT PAIN RIGHT: Saw orthopedic provider yesterday, Dr. Mikle Bosworth.  Reports pain is under her toes on right foot and front of right foot.  Was ordered Meloxicam for chronic pain and numbness issues at this time to wrists and shoulder.  Is working on [employer].  She reports ortho is going to perform nerve testing on the foot.  States the pain at this time is tolerable.  Pain is intermittent, can not think of triggers for it.  No consistent time pattern for pain, often more at end of day after being on feet all day.    Relevant past medical, surgical, family and social history reviewed and updated as indicated. Interim medical history since our last visit reviewed. Allergies and medications reviewed and updated.  Review of Systems  Constitutional: Negative for activity change, appetite change, diaphoresis, fatigue and fever.  Respiratory: Negative for cough, chest tightness and shortness of breath.   Cardiovascular: Negative for chest pain, palpitations and leg swelling.  Gastrointestinal: Negative for abdominal distention, abdominal pain, constipation, diarrhea, nausea and vomiting.  Endocrine: Negative for cold intolerance, heat intolerance, polydipsia, polyphagia and polyuria.  Musculoskeletal: Positive  for arthralgias.  Neurological: Negative for dizziness, syncope, weakness, light-headedness, numbness and headaches.  Psychiatric/Behavioral: Negative.     Per HPI unless specifically indicated above     Objective:    BP 115/70   Pulse 68   Temp 98.3 F (36.8 C) (Oral)   Ht 5\' 9"  (1.753 m)   Wt 171 lb (77.6 kg)   LMP  (LMP Unknown)   SpO2 98%   BMI 25.25 kg/m   Wt Readings from Last 3 Encounters:  04/16/18 171 lb (77.6 kg)  01/02/18 168 lb (76.2 kg)  12/17/17 173 lb (78.5 kg)    Physical Exam Vitals signs and nursing note reviewed.  Constitutional:      Appearance: She is well-developed.  HENT:     Head: Normocephalic.  Eyes:      General:        Right eye: No discharge.        Left eye: No discharge.     Conjunctiva/sclera: Conjunctivae normal.     Pupils: Pupils are equal, round, and reactive to light.  Neck:     Musculoskeletal: Normal range of motion and neck supple.     Thyroid: No thyromegaly.     Vascular: No carotid bruit or JVD.  Cardiovascular:     Rate and Rhythm: Normal rate and regular rhythm.     Pulses:          Dorsalis pedis pulses are 2+ on the right side and 2+ on the left side.       Posterior tibial pulses are 2+ on the right side and 2+ on the left side.     Heart sounds: Normal heart sounds. No murmur. No gallop.   Pulmonary:     Effort: Pulmonary effort is normal.     Breath sounds: Normal breath sounds.  Abdominal:     General: Bowel sounds are normal.     Palpations: Abdomen is soft.  Musculoskeletal:     Right lower leg: No edema.     Left lower leg: No edema.  Feet:     Right foot:     Skin integrity: No skin breakdown, erythema or warmth.     Toenail Condition: Right toenails are normal.     Left foot:     Skin integrity: No skin breakdown, erythema or warmth.     Toenail Condition: Left toenails are normal.  Lymphadenopathy:     Cervical: No cervical adenopathy.  Skin:    General: Skin is warm and dry.  Neurological:     Mental Status: She is alert and oriented to person, place, and time.  Psychiatric:        Mood and Affect: Mood normal.        Behavior: Behavior normal.        Thought Content: Thought content normal.        Judgment: Judgment normal.     Results for orders placed or performed in visit on 12/17/17  HgB A1c  Result Value Ref Range   Hgb A1c MFr Bld 5.9 (H) 4.8 - 5.6 %   Est. average glucose Bld gHb Est-mCnc 123 mg/dL  B12  Result Value Ref Range   Vitamin B-12 1,322 (H) 232 - 1,245 pg/mL      Assessment & Plan:   Problem List Items Addressed This Visit      Nervous and Auditory   Neuropathy    Chronic, ongoing.  Continue to collaborate  with orthopedic provider.  Other   Anxiety    Chronic, stable.  Continue current medication regimen with Celexa.          Follow up plan: Return in about 4 months (around 08/16/2018) for Annual Physical.

## 2018-04-16 NOTE — Patient Instructions (Signed)
Fat and Cholesterol Restricted Eating Plan Getting too much fat and cholesterol in your diet may cause health problems. Choosing the right foods helps keep your fat and cholesterol at normal levels. This can keep you from getting certain diseases. Your doctor may recommend an eating plan that includes:  Total fat: ______% or less of total calories a day.  Saturated fat: ______% or less of total calories a day.  Cholesterol: less than _________mg a day.  Fiber: ______g a day. What are tips for following this plan? Meal planning  At meals, divide your plate into four equal parts: ? Fill one-half of your plate with vegetables and green salads. ? Fill one-fourth of your plate with whole grains. ? Fill one-fourth of your plate with low-fat (lean) protein foods.  Eat fish that is high in omega-3 fats at least two times a week. This includes mackerel, tuna, sardines, and salmon.  Eat foods that are high in fiber, such as whole grains, beans, apples, broccoli, carrots, peas, and barley. General tips   Work with your doctor to lose weight if you need to.  Avoid: ? Foods with added sugar. ? Fried foods. ? Foods with partially hydrogenated oils.  Limit alcohol intake to no more than 1 drink a day for nonpregnant women and 2 drinks a day for men. One drink equals 12 oz of beer, 5 oz of wine, or 1 oz of hard liquor. Reading food labels  Check food labels for: ? Trans fats. ? Partially hydrogenated oils. ? Saturated fat (g) in each serving. ? Cholesterol (mg) in each serving. ? Fiber (g) in each serving.  Choose foods with healthy fats, such as: ? Monounsaturated fats. ? Polyunsaturated fats. ? Omega-3 fats.  Choose grain products that have whole grains. Look for the word "whole" as the first word in the ingredient list. Cooking  Cook foods using low-fat methods. These include baking, boiling, grilling, and broiling.  Eat more home-cooked foods. Eat at restaurants and buffets  less often.  Avoid cooking using saturated fats, such as butter, cream, palm oil, palm kernel oil, and coconut oil. Recommended foods  Fruits  All fresh, canned (in natural juice), or frozen fruits. Vegetables  Fresh or frozen vegetables (raw, steamed, roasted, or grilled). Green salads. Grains  Whole grains, such as whole wheat or whole grain breads, crackers, cereals, and pasta. Unsweetened oatmeal, bulgur, barley, quinoa, or brown rice. Corn or whole wheat flour tortillas. Meats and other protein foods  Ground beef (85% or leaner), grass-fed beef, or beef trimmed of fat. Skinless chicken or turkey. Ground chicken or turkey. Pork trimmed of fat. All fish and seafood. Egg whites. Dried beans, peas, or lentils. Unsalted nuts or seeds. Unsalted canned beans. Nut butters without added sugar or oil. Dairy  Low-fat or nonfat dairy products, such as skim or 1% milk, 2% or reduced-fat cheeses, low-fat and fat-free ricotta or cottage cheese, or plain low-fat and nonfat yogurt. Fats and oils  Tub margarine without trans fats. Light or reduced-fat mayonnaise and salad dressings. Avocado. Olive, canola, sesame, or safflower oils. The items listed above may not be a complete list of foods and beverages you can eat. Contact a dietitian for more information. Foods to avoid Fruits  Canned fruit in heavy syrup. Fruit in cream or butter sauce. Fried fruit. Vegetables  Vegetables cooked in cheese, cream, or butter sauce. Fried vegetables. Grains  White bread. White pasta. White rice. Cornbread. Bagels, pastries, and croissants. Crackers and snack foods that contain trans fat   and hydrogenated oils. Meats and other protein foods  Fatty cuts of meat. Ribs, chicken wings, bacon, sausage, bologna, salami, chitterlings, fatback, hot dogs, bratwurst, and packaged lunch meats. Liver and organ meats. Whole eggs and egg yolks. Chicken and turkey with skin. Fried meat. Dairy  Whole or 2% milk, cream,  half-and-half, and cream cheese. Whole milk cheeses. Whole-fat or sweetened yogurt. Full-fat cheeses. Nondairy creamers and whipped toppings. Processed cheese, cheese spreads, and cheese curds. Beverages  Alcohol. Sugar-sweetened drinks such as sodas, lemonade, and fruit drinks. Fats and oils  Butter, stick margarine, lard, shortening, ghee, or bacon fat. Coconut, palm kernel, and palm oils. Sweets and desserts  Corn syrup, sugars, honey, and molasses. Candy. Jam and jelly. Syrup. Sweetened cereals. Cookies, pies, cakes, donuts, muffins, and ice cream. The items listed above may not be a complete list of foods and beverages you should avoid. Contact a dietitian for more information. Summary  Choosing the right foods helps keep your fat and cholesterol at normal levels. This can keep you from getting certain diseases.  At meals, fill one-half of your plate with vegetables and green salads.  Eat high-fiber foods, like whole grains, beans, apples, carrots, peas, and barley.  Limit added sugar, saturated fats, alcohol, and fried foods. This information is not intended to replace advice given to you by your health care provider. Make sure you discuss any questions you have with your health care provider. Document Released: 07/18/2011 Document Revised: 09/19/2017 Document Reviewed: 10/03/2016 Elsevier Interactive Patient Education  2019 [employer] Inc[employer]   

## 2018-08-21 ENCOUNTER — Other Ambulatory Visit: Payer: Self-pay

## 2018-08-21 ENCOUNTER — Encounter: Payer: Self-pay | Admitting: Nurse Practitioner

## 2018-08-21 ENCOUNTER — Ambulatory Visit (INDEPENDENT_AMBULATORY_CARE_PROVIDER_SITE_OTHER): Payer: Commercial Managed Care - PPO | Admitting: Nurse Practitioner

## 2018-08-21 VITALS — BP 136/82 | HR 63 | Temp 98.4°F | Ht 68.5 in | Wt 174.6 lb

## 2018-08-21 DIAGNOSIS — E782 Mixed hyperlipidemia: Secondary | ICD-10-CM | POA: Diagnosis not present

## 2018-08-21 DIAGNOSIS — Z0000 Encounter for general adult medical examination without abnormal findings: Secondary | ICD-10-CM | POA: Diagnosis not present

## 2018-08-21 DIAGNOSIS — E559 Vitamin D deficiency, unspecified: Secondary | ICD-10-CM | POA: Diagnosis not present

## 2018-08-21 DIAGNOSIS — R7303 Prediabetes: Secondary | ICD-10-CM

## 2018-08-21 DIAGNOSIS — F419 Anxiety disorder, unspecified: Secondary | ICD-10-CM | POA: Diagnosis not present

## 2018-08-21 DIAGNOSIS — F324 Major depressive disorder, single episode, in partial remission: Secondary | ICD-10-CM

## 2018-08-21 NOTE — Patient Instructions (Signed)

## 2018-08-21 NOTE — Progress Notes (Signed)
BP 136/82 (BP Location: Left Arm, Patient Position: Sitting)   Pulse 63   Temp 98.4 F (36.9 C) (Oral)   Ht 5' 8.5" (1.74 m)   Wt 174 lb 9.6 oz (79.2 kg)   LMP  (LMP Unknown)   SpO2 100%   BMI 26.16 kg/m    Subjective:    Patient ID: Suzanne Scott, female    DOB: [birth year], 58 y.o.   MRN: [id number]  HPI: Suzanne Scott is a 58 y.o. female presenting on 08/21/2018 for comprehensive medical examination. Current medical complaints include:none  She currently lives with: self Menopausal Symptoms: no   ANXIETY/STRESS Currently taking Celexa 20 MG, states she feels her mood is under control.   Duration:stable Anxious mood: yes  Excessive worrying: yes Irritability: yes  Sweating: no Nausea: no Palpitations:no Hyperventilation: no Panic attacks: no Agoraphobia: no  Obscessions/compulsions: no Depressed mood: no Anhedonia: no Weight changes: no Insomnia: yes hard to stay asleep  Hypersomnia: no Fatigue/loss of energy: no Feelings of worthlessness: no Feelings of guilt: no Impaired concentration/indecisiveness: no Suicidal ideations: no  Crying spells: no Recent Stressors/Life Changes: no   Relationship problems: no   Family stress: no     Financial stress: no    Job stress: no    Recent death/loss: no  Depression Screen done today and results listed below:  Depression screen [facility] 2/9 08/21/2018 04/16/2018 09/17/2017 07/31/2017 01/26/2017  Decreased Interest 0 0 0 0 1  Down, Depressed, Hopeless 0 0 0 0 1  PHQ - 2 Score 0 0 0 0 2  Altered sleeping 0 1 3 0 1  Tired, decreased energy 1 0 2 0 1  Change in appetite 0 1 2 1  0  Feeling bad or failure about yourself  0 0 0 0 0  Trouble concentrating 3 1 3 2 3   Moving slowly or fidgety/restless 0 0 0 0 0  Suicidal thoughts 0 0 0 0 0  PHQ-9 Score 4 3 10 3 7   Difficult doing work/chores Not difficult at all Not difficult at all - - -   GAD 7 : Generalized Anxiety Score 08/21/2018 04/16/2018  Nervous, Anxious, on Edge  1 0  Control/stop worrying 1 1  Worry too much - different things 1 1  Trouble relaxing 3 1  Restless 1 1  Easily annoyed or irritable 1 1  Afraid - awful might happen 2 0  Total GAD 7 Score 10 5  Anxiety Difficulty Not difficult at all Somewhat difficult    The patient does not have a history of falls. I did not complete a risk assessment for falls. A plan of care for falls was not documented.   Past Medical History:  Past Medical History:  Diagnosis Date  . Anxiety   . Atrophic vaginitis   . Hyperlipidemia     Surgical History:  Past Surgical History:  Procedure Laterality Date  . BREAST BIOPSY Left 04/08/2015   negative  . COLONOSCOPY WITH PROPOFOL N/A 10/15/2017   Procedure: COLONOSCOPY WITH PROPOFOL;  Surgeon: Lin Landsman, MD;  Location: [facility] ENDOSCOPY;  Service: Gastroenterology;  Laterality: N/A;  . ESOPHAGOGASTRODUODENOSCOPY (EGD) WITH PROPOFOL N/A 10/15/2017   Procedure: ESOPHAGOGASTRODUODENOSCOPY (EGD) WITH PROPOFOL;  Surgeon: Lin Landsman, MD;  Location: [facility] ENDOSCOPY;  Service: Gastroenterology;  Laterality: N/A;    Medications:  Current Outpatient Medications on File Prior to Visit  Medication Sig  . Brimonidine Tartrate (LUMIFY OP) Apply to eye.  . citalopram (CELEXA) 20 MG tablet  TAKE 1 TABLET BY MOUTH EVERY DAY  . meloxicam (MOBIC) 15 MG tablet Take by mouth.  Marland Kitchen omeprazole (PRILOSEC) 40 MG capsule TAKE 1 CAPSULE(40 MG) BY MOUTH DAILY BEFORE BREAKFAST  . prednisoLONE acetate (PRED FORTE) 1 % ophthalmic suspension INT 1 GTT INTO OU TID   Current Facility-Administered Medications on File Prior to Visit  Medication  . betamethasone acetate-betamethasone sodium phosphate (CELESTONE) injection 3 mg  . betamethasone acetate-betamethasone sodium phosphate (CELESTONE) injection 3 mg  . betamethasone acetate-betamethasone sodium phosphate (CELESTONE) injection 3 mg  . betamethasone acetate-betamethasone sodium phosphate (CELESTONE) injection 3 mg     Allergies:  No Known Allergies  Social History:  Social History   Socioeconomic History  . Marital status: Married    Spouse name: Not on file  . Number of children: 3  . Years of education: 22  . Highest education level: Not on file  Occupational History  . Not on file  Social Needs  . Financial resource strain: Not on file  . Food insecurity    Worry: Not on file    Inability: Not on file  . Transportation needs    Medical: Not on file    Non-medical: Not on file  Tobacco Use  . Smoking status: Never Smoker  . Smokeless tobacco: Never Used  Substance and Sexual Activity  . Alcohol use: Yes    Alcohol/week: 0.0 standard drinks    Comment: pt states she drinks wine on occassion  . Drug use: No  . Sexual activity: Yes  Lifestyle  . Physical activity    Days per week: Not on file    Minutes per session: Not on file  . Stress: Not on file  Relationships  . Social Herbalist on phone: Not on file    Gets together: Not on file    Attends religious service: Not on file    Active member of club or organization: Not on file    Attends meetings of clubs or organizations: Not on file    Relationship status: Not on file  . Intimate partner violence    Fear of current or ex partner: Not on file    Emotionally abused: Not on file    Physically abused: Not on file    Forced sexual activity: Not on file  Other Topics Concern  . Not on file  Social History Narrative  . Not on file   Social History   Tobacco Use  Smoking Status Never Smoker  Smokeless Tobacco Never Used   Social History   Substance and Sexual Activity  Alcohol Use Yes  . Alcohol/week: 0.0 standard drinks   Comment: pt states she drinks wine on occassion    Family History:  Family History  Problem Relation Age of Onset  . Arthritis Mother   . Heart disease Mother   . Breast cancer Neg Hx     Past medical history, surgical history, medications, allergies, family history and social  history reviewed with patient today and changes made to appropriate areas of the chart.   Review of Systems - negative All other ROS negative except what is listed above and in the HPI.      Objective:    BP 136/82 (BP Location: Left Arm, Patient Position: Sitting)   Pulse 63   Temp 98.4 F (36.9 C) (Oral)   Ht 5' 8.5" (1.74 m)   Wt 174 lb 9.6 oz (79.2 kg)   LMP  (LMP Unknown)   SpO2 100%  BMI 26.16 kg/m   Wt Readings from Last 3 Encounters:  08/21/18 174 lb 9.6 oz (79.2 kg)  04/16/18 171 lb (77.6 kg)  01/02/18 168 lb (76.2 kg)    Physical Exam Vitals signs and nursing note reviewed.  Constitutional:      General: She is awake. She is not in acute distress.    Appearance: She is well-developed. She is not ill-appearing.  HENT:     Head: Normocephalic.     Right Ear: Hearing, tympanic membrane, ear canal and external ear normal.     Left Ear: Hearing, tympanic membrane, ear canal and external ear normal.     Nose: Nose normal.     Mouth/Throat:     Mouth: Mucous membranes are moist.     Pharynx: Oropharynx is clear. Uvula midline.  Eyes:     General: Lids are normal.        Right eye: No discharge.        Left eye: No discharge.     Extraocular Movements: Extraocular movements intact.     Conjunctiva/sclera: Conjunctivae normal.     Pupils: Pupils are equal, round, and reactive to light.     Visual Fields: Right eye visual fields normal and left eye visual fields normal.  Neck:     Musculoskeletal: Normal range of motion and neck supple.     Thyroid: No thyromegaly.     Vascular: No carotid bruit.  Cardiovascular:     Rate and Rhythm: Normal rate and regular rhythm.     Heart sounds: Normal heart sounds. No murmur. No gallop.   Pulmonary:     Effort: Pulmonary effort is normal. No accessory muscle usage or respiratory distress.     Breath sounds: Normal breath sounds.  Chest:     Breasts:        Right: Normal. No swelling, bleeding, inverted nipple, mass,  nipple discharge, skin change or tenderness.        Left: Normal. No swelling, bleeding, inverted nipple, mass, nipple discharge, skin change or tenderness.  Abdominal:     General: Abdomen is flat. Bowel sounds are normal.     Palpations: Abdomen is soft. There is no hepatomegaly or splenomegaly.     Tenderness: There is no abdominal tenderness.  Musculoskeletal:     Right lower leg: No edema.     Left lower leg: No edema.  Lymphadenopathy:     Cervical: No cervical adenopathy.     Upper Body:     Right upper body: No supraclavicular, axillary or pectoral adenopathy.     Left upper body: No supraclavicular, axillary or pectoral adenopathy.  Skin:    General: Skin is warm and dry.     Findings: No lesion or rash.  Neurological:     Mental Status: She is alert and oriented to person, place, and time.     Cranial Nerves: Cranial nerves are intact.     Gait: Gait is intact.     Deep Tendon Reflexes:     Reflex Scores:      Brachioradialis reflexes are 2+ on the right side and 2+ on the left side.      Patellar reflexes are 2+ on the right side and 2+ on the left side. Psychiatric:        Attention and Perception: Attention normal.        Mood and Affect: Mood normal.        Speech: Speech normal.  Behavior: Behavior normal. Behavior is cooperative.        Thought Content: Thought content normal.        Cognition and Memory: Cognition normal.        Judgment: Judgment normal.     Results for orders placed or performed in visit on 12/17/17  HgB A1c  Result Value Ref Range   Hgb A1c MFr Bld 5.9 (H) 4.8 - 5.6 %   Est. average glucose Bld gHb Est-mCnc 123 mg/dL  B12  Result Value Ref Range   Vitamin B-12 1,322 (H) 232 - 1,245 pg/mL      Assessment & Plan:   Problem List Items Addressed This Visit      Other   Anxiety    Chronic, stable.  Continue current medication regimen and adjust as needed.         Hyperlipidemia    No current statin therapy.  Check lipid  panel and CMP.      Relevant Orders   Comprehensive metabolic panel   Lipid Panel w/o Chol/HDL Ratio   Depression    Chronic, stable.  Continue current medication regimen and adjust as needed.  Denies SI/HI.          Prediabetes    Recheck A1C      Relevant Orders   HgB A1c   Vitamin D deficiency    Check Vit D level, low in 2018 and no current supplement.      Relevant Orders   VITAMIN D 25 Hydroxy (Vit-D Deficiency, Fractures)    Other Visit Diagnoses    Encounter for annual physical exam    -  Primary   Relevant Orders   CBC with Differential/Platelet   Comprehensive metabolic panel   TSH       Follow up plan: Return in about 6 months (around 02/21/2019) for HTN/HLD and Mood.   LABORATORY TESTING:  - Pap smear: up to date  IMMUNIZATIONS:   - Tdap: Tetanus vaccination status reviewed: last tetanus booster within 10 years. - Influenza: Up to date - Pneumovax: Not applicable - Prevnar: Not applicable - HPV: Not applicable - Zostavax vaccine: Refused  SCREENING: -Mammogram: Up to date  - Colonoscopy: Up to date  - Bone Density: Not applicable  -Hearing Test: Not applicable  -Spirometry: Not applicable   PATIENT COUNSELING:   Advised to take 1 mg of folate supplement per day if capable of pregnancy.   Sexuality: Discussed sexually transmitted diseases, partner selection, use of condoms, avoidance of unintended pregnancy  and contraceptive alternatives.   Advised to avoid cigarette smoking.  I discussed with the patient that most people either abstain from alcohol or drink within safe limits (<=14/week and <=4 drinks/occasion for males, <=7/weeks and <= 3 drinks/occasion for females) and that the risk for alcohol disorders and other health effects rises proportionally with the number of drinks per week and how often a drinker exceeds daily limits.  Discussed cessation/primary prevention of drug use and availability of treatment for abuse.   Diet:  Encouraged to adjust caloric intake to maintain  or achieve ideal body weight, to reduce intake of dietary saturated fat and total fat, to limit sodium intake by avoiding high sodium foods and not adding table salt, and to maintain adequate dietary potassium and calcium preferably from fresh fruits, vegetables, and low-fat dairy products.    stressed the importance of regular exercise  Injury prevention: Discussed safety belts, safety helmets, smoke detector, smoking near bedding or upholstery.   Dental health:  Discussed importance of regular tooth brushing, flossing, and dental visits.    NEXT PREVENTATIVE PHYSICAL DUE IN 1 YEAR. Return in about 6 months (around 02/21/2019) for HTN/HLD and Mood.

## 2018-08-21 NOTE — Assessment & Plan Note (Signed)
Chronic, stable.  Continue current medication regimen and adjust as needed. ?

## 2018-08-21 NOTE — Assessment & Plan Note (Signed)
No current statin therapy.  Check lipid panel and CMP.

## 2018-08-21 NOTE — Assessment & Plan Note (Signed)
Recheck A1C 

## 2018-08-21 NOTE — Assessment & Plan Note (Signed)
Chronic, stable.  Continue current medication regimen and adjust as needed.  Denies SI/HI. 

## 2018-08-21 NOTE — Assessment & Plan Note (Signed)
Check Vit D level, low in 2018 and no current supplement.

## 2018-08-22 LAB — LIPID PANEL W/O CHOL/HDL RATIO
Cholesterol, Total: 237 mg/dL — ABNORMAL HIGH (ref 100–199)
HDL: 48 mg/dL (ref >39)
LDL Calculated: 169 mg/dL — ABNORMAL HIGH (ref 0–99)
Triglycerides: 98 mg/dL (ref 0–149)
VLDL Cholesterol Cal: 20 mg/dL (ref 5–40)

## 2018-08-22 LAB — COMPREHENSIVE METABOLIC PANEL
ALT: 22 IU/L (ref 0–32)
AST: 24 IU/L (ref 0–40)
Albumin/Globulin Ratio: 1.8 (ref 1.2–2.2)
Albumin: 4.5 g/dL (ref 3.8–4.9)
Alkaline Phosphatase: 66 IU/L (ref 39–117)
BUN/Creatinine Ratio: 17 (ref 9–23)
BUN: 13 mg/dL (ref 6–24)
Bilirubin Total: 0.3 mg/dL (ref 0.0–1.2)
CO2: 27 mmol/L (ref 20–29)
Calcium: 9 mg/dL (ref 8.7–10.2)
Chloride: 102 mmol/L (ref 96–106)
Creatinine, Ser: 0.78 mg/dL (ref 0.57–1.00)
GFR calc Af Amer: 96 mL/min/{1.73_m2} (ref >59)
GFR calc non Af Amer: 83 mL/min/{1.73_m2} (ref >59)
Globulin, Total: 2.5 g/dL (ref 1.5–4.5)
Glucose: 76 mg/dL (ref 65–99)
Potassium: 3.8 mmol/L (ref 3.5–5.2)
Sodium: 141 mmol/L (ref 134–144)
Total Protein: 7 g/dL (ref 6.0–8.5)

## 2018-08-22 LAB — CBC WITH DIFFERENTIAL/PLATELET
Basophils Absolute: 0 10*3/uL (ref 0.0–0.2)
Basos: 1 %
EOS (ABSOLUTE): 0.1 10*3/uL (ref 0.0–0.4)
Eos: 2 %
Hematocrit: 40.2 % (ref 34.0–46.6)
Hemoglobin: 13.1 g/dL (ref 11.1–15.9)
Immature Grans (Abs): 0 10*3/uL (ref 0.0–0.1)
Immature Granulocytes: 0 %
Lymphocytes Absolute: 1.5 10*3/uL (ref 0.7–3.1)
Lymphs: 35 %
MCH: 27.5 pg (ref 26.6–33.0)
MCHC: 32.6 g/dL (ref 31.5–35.7)
MCV: 84 fL (ref 79–97)
Monocytes Absolute: 0.4 10*3/uL (ref 0.1–0.9)
Monocytes: 9 %
Neutrophils Absolute: 2.2 10*3/uL (ref 1.4–7.0)
Neutrophils: 53 %
Platelets: 276 10*3/uL (ref 150–450)
RBC: 4.77 x10E6/uL (ref 3.77–5.28)
RDW: 14.9 % (ref 11.7–15.4)
WBC: 4.2 10*3/uL (ref 3.4–10.8)

## 2018-08-22 LAB — VITAMIN D 25 HYDROXY (VIT D DEFICIENCY, FRACTURES): Vit D, 25-Hydroxy: 25.1 ng/mL — ABNORMAL LOW (ref 30.0–100.0)

## 2018-08-22 LAB — HEMOGLOBIN A1C
Est. average glucose Bld gHb Est-mCnc: 128 mg/dL
Hgb A1c MFr Bld: 6.1 % — ABNORMAL HIGH (ref 4.8–5.6)

## 2018-08-22 LAB — TSH: TSH: 0.589 u[IU]/mL (ref 0.450–4.500)

## 2018-08-23 ENCOUNTER — Other Ambulatory Visit: Payer: Self-pay | Admitting: Nurse Practitioner

## 2018-08-23 ENCOUNTER — Telehealth: Payer: Self-pay

## 2018-08-23 DIAGNOSIS — R7303 Prediabetes: Secondary | ICD-10-CM

## 2018-08-23 MED ORDER — METFORMIN HCL 500 MG PO TABS: ORAL_TABLET | ORAL | 3 refills | Status: DC

## 2018-08-23 NOTE — Progress Notes (Signed)
Metformin script and dietician order for prediabetes and hypercholesteremia.

## 2018-08-23 NOTE — Telephone Encounter (Signed)
Orders placed.

## 2018-08-23 NOTE — Telephone Encounter (Signed)
Copied from [place] [id number]. Topic: General - Inquiry >> Aug 23, 2018 10:22 AM Percell Belt A wrote: Reason for CRM: pt called and would like nurse to call her so she can ask some question about her lab results  Best number  [phone]   Called and spoke to patient. She states she thinks she should go ahead and start the lose dose Metformin that Jolene mentioned in her my chart message, would like it sent to [facility] on [address]. Patient also mentioned that she would like a referral to nutrition/dietician to help with cholesterol.

## 2018-09-15 ENCOUNTER — Other Ambulatory Visit: Payer: Self-pay | Admitting: Nurse Practitioner

## 2018-09-15 DIAGNOSIS — F419 Anxiety disorder, unspecified: Secondary | ICD-10-CM

## 2018-09-17 ENCOUNTER — Telehealth: Payer: Self-pay

## 2018-09-17 NOTE — Telephone Encounter (Signed)
Patient sent in a form to be filled out also Spoke with patient, she states that the metformin is causing her to be sick, funny taste in her mouth, some abdominal and chest pain. Please call to discuss. Patient states that she did not take any today, but she was up to 2 tablets a day.

## 2018-09-17 NOTE — Telephone Encounter (Signed)
Pt has called back and wants you to return her call as soon as possible  [phone]

## 2018-09-17 NOTE — Telephone Encounter (Signed)
Spoke to patient, will fill out form once available.  Told her to stop taking Metformin and focus on diet until next visit.  Will recheck labs then.

## 2018-09-17 NOTE — Telephone Encounter (Signed)
Tried to call patient to discuss medication, no answer, unable to leave a message, will try again.   Copied from [place] [phone]. Topic: General - Other >> Sep 16, 2018 10:58 AM Pauline Good wrote: Reason for CRM: need to speak to nurse concerning medications adjustment

## 2018-10-02 ENCOUNTER — Encounter: Payer: Self-pay | Admitting: Dietician

## 2018-10-02 ENCOUNTER — Other Ambulatory Visit: Payer: Self-pay

## 2018-10-02 ENCOUNTER — Encounter: Payer: Commercial Managed Care - PPO | Attending: Nurse Practitioner | Admitting: Dietician

## 2018-10-02 ENCOUNTER — Telehealth: Payer: Self-pay

## 2018-10-02 VITALS — Ht 68.5 in | Wt 169.7 lb

## 2018-10-02 DIAGNOSIS — R7303 Prediabetes: Secondary | ICD-10-CM | POA: Diagnosis present

## 2018-10-02 DIAGNOSIS — E7800 Pure hypercholesterolemia, unspecified: Secondary | ICD-10-CM | POA: Diagnosis not present

## 2018-10-02 DIAGNOSIS — Z713 Dietary counseling and surveillance: Secondary | ICD-10-CM | POA: Diagnosis not present

## 2018-10-02 NOTE — Telephone Encounter (Signed)
Patient notified form was faxed a couple weeks ago.  Copied from [place]. Topic: General - Inquiry >> Oct 02, 2018 12:06 PM Scherrie Gerlach wrote: Reason for CRM:  pt wants to know if her proff of CPE form is ready to pick up?

## 2018-10-02 NOTE — Patient Instructions (Signed)
   Keep up your healthy food choices, great job!  Limit carbs to 45grams or 3 servings, with meals. Combine with a protein food and a generous portion of low-carb vegetables.   Stay active most days of the week by increasing daily steps; try moving for at least 10-15 minutes after eating.

## 2018-10-02 NOTE — Progress Notes (Signed)
Medical Nutrition Therapy: Visit start time: 6270  end time: 1630  Assessment:  Diagnosis: pre-diabetes Past medical history: none significant Psychosocial issues/ stress concerns: none  Preferred learning method:  . Auditory . Visual . Hands-on   Current weight: 169.7lbs Height: 5'8.5" Medications, supplements: reconciled list in medical record  Progress and evaluation:   Patient reports some picky eating, but does try to eat healty foods; she has reduced intake of rice, bread, other carbs since her HbA1C increased. Most recent HbA1C was 6.1% on 08/21/18.  She has read about breads such as rye, pumpermickel, sourdough as better for BG.   She sometimes will go for a while without eating and then when eating a delayed meal will overeat; she states she had been eating large portions of rice, bread, and other starchy foods.   Patient's weight goal is to maintain her current weight.   She does not have family history of DM.  Physical activity: semi-active job, pedometer indicates about 3000 steps daily  Dietary Intake:  Usual eating pattern includes 3 meals and 2-3 snacks per day. Dining out frequency: 0-1 meals per week.  Breakfast: quick cooking oatmeal with raisins;  smoothie-- berries with milk; weekends cooked breakfast -- eggs, avocado or sardines; almond crunch cereal; occ out-- egg, bacon, waffle Snack: yogurt; cereal bar Lunch: chicken/ fish/ shrimp/ pork/ beef + broccoli, carrots; cream spinach with okra/ plain spinach/ acorn squash/ lentils; less rice recently Snack: fruit -- mango, peaches; same as am Supper: same as lunch; sandwich; cheese and crackers; avocado and crackers; mac and cheese Snack: nuts; mango or banana Beverages: water, hot tea with lemon and honey in am or night, or hot chocolate at night.  Nutrition Care Education: Topics covered: diabetes prevention Basic nutrition: basic food groups, appropriate nutrient balance, appropriate meal and snack  schedule, general nutrition guidelines    Weight control: maintaining healthy weight Diabetes prevention: appropriate meal and snack schedule, appropriate carb intake and balance, healthy carb choices, appropriate portions of carbohydrate foods; role of physical activity in preventing diabetes/ controlling BG.  Nutritional Diagnosis:  [place]-2.2 Altered nutrition-related laboratory As related to pre-diabetes.  As evidenced by patient with recent HbA1C of 6.1%.  Intervention:   Instruction and discussion as noted above.  Commended patient for positive changes she has already made.  She will continue to work on controlling amount of carb intake with meals and snacks, and will increase physical activity to reduce risk for diabetes.  Education Materials given:  . General diet guidelines for Diabetes (prevention) . Plate Planner with food lists . Sample menus . Goals/ instructions   Learner/ who was taught:  . Patient   Level of understanding: Marland Kitchen Verbalizes/ demonstrates competency   Demonstrated degree of understanding via:   Teach back Learning barriers: . None  Willingness to learn/ readiness for change: . Eager, change in progress  Monitoring and Evaluation:  Dietary intake, exercise, BG control, and body weight      follow up: prn

## 2018-10-03 NOTE — Telephone Encounter (Signed)
Called and left a message for patient, asking patient if she can get another formed faxed to [place], the original form has been sent to the scan center.

## 2018-10-03 NOTE — Telephone Encounter (Signed)
Pt called and stated that he work did not receive and would like to know if we can fax back over   [phone]

## 2018-10-04 NOTE — Telephone Encounter (Signed)
Spoke with patient, she has refaxed the form, will get it filled out and signed by jolene.

## 2018-10-09 NOTE — Telephone Encounter (Signed)
Pt stated that the fax has not been received. Pt asked that the fax be sent again.

## 2018-10-11 NOTE — Telephone Encounter (Signed)
Patients husband picked up a copy.

## 2018-10-23 ENCOUNTER — Other Ambulatory Visit: Payer: Self-pay | Admitting: Nurse Practitioner

## 2018-10-23 DIAGNOSIS — Z1231 Encounter for screening mammogram for malignant neoplasm of breast: Secondary | ICD-10-CM

## 2018-11-07 ENCOUNTER — Other Ambulatory Visit: Payer: Self-pay | Admitting: Nurse Practitioner

## 2018-11-07 DIAGNOSIS — K219 Gastro-esophageal reflux disease without esophagitis: Secondary | ICD-10-CM

## 2018-11-07 MED ORDER — OMEPRAZOLE 40 MG PO CPDR: DELAYED_RELEASE_CAPSULE | ORAL | 2 refills | Status: DC

## 2018-11-07 NOTE — Telephone Encounter (Signed)
Requested medication (s) are due for refill today: no  Requested medication (s) are on the active medication list: yes  Last refill:  12/11/2017  Future visit scheduled: yes  Notes to clinic:  Review for refill Looks like patient reported not taking medication    Requested Prescriptions  Pending Prescriptions Disp Refills   omeprazole (PRILOSEC) 40 MG capsule 90 capsule 0    Sig: TAKE 1 CAPSULE(40 MG) BY MOUTH DAILY BEFORE BREAKFAST     Gastroenterology: Proton Pump Inhibitors Passed - 11/07/2018 12:27 PM      Passed - Valid encounter within last 12 months    Recent Outpatient Visits          2 months ago Encounter for annual physical exam   [facility] [place], Barbaraann Faster, NP   6 months ago [place], Henrine Screws T, NP   10 months ago Rib lesion   [facility] Merrie Roof [place], [place]   10 months ago Neuropathy   [facility] [place], [place] T, NP   1 year ago [place], [place]      Future Appointments            In 3 months [place], Barbaraann Faster, NP [facility]

## 2018-11-07 NOTE — Telephone Encounter (Signed)
Medication Refill - Medication: omeprazole (PRILOSEC) 40 MG capsule   Has the patient contacted their pharmacy? Yes.   (Agent: If no, request that the patient contact the pharmacy for the refill.) (Agent: If yes, when and what did the pharmacy advise?)  Preferred Pharmacy (with phone number or street name): [id number] - [place], [place]: Please be advised that RX refills may take up to 3 business days. We ask that you follow-up with your pharmacy.

## 2018-11-27 ENCOUNTER — Ambulatory Visit
Admission: RE | Admit: 2018-11-27 | Discharge: 2018-11-27 | Disposition: A | Discharge status: Home / Self Care | Payer: Commercial Managed Care - PPO | Source: Ambulatory Visit | Attending: Nurse Practitioner | Admitting: Nurse Practitioner

## 2018-11-27 DIAGNOSIS — Z1231 Encounter for screening mammogram for malignant neoplasm of breast: Secondary | ICD-10-CM | POA: Diagnosis not present

## 2019-01-14 ENCOUNTER — Other Ambulatory Visit: Payer: Self-pay

## 2019-01-14 ENCOUNTER — Ambulatory Visit (INDEPENDENT_AMBULATORY_CARE_PROVIDER_SITE_OTHER): Payer: Commercial Managed Care - PPO | Admitting: Nurse Practitioner

## 2019-01-14 ENCOUNTER — Encounter: Payer: Self-pay | Admitting: Nurse Practitioner

## 2019-01-14 VITALS — BP 113/69 | HR 74 | Temp 97.9°F | Wt 165.0 lb

## 2019-01-14 DIAGNOSIS — M545 Low back pain, unspecified: Secondary | ICD-10-CM

## 2019-01-14 DIAGNOSIS — R5383 Other fatigue: Secondary | ICD-10-CM | POA: Diagnosis not present

## 2019-01-14 DIAGNOSIS — R7303 Prediabetes: Secondary | ICD-10-CM | POA: Diagnosis not present

## 2019-01-14 DIAGNOSIS — E559 Vitamin D deficiency, unspecified: Secondary | ICD-10-CM | POA: Diagnosis not present

## 2019-01-14 DIAGNOSIS — M549 Dorsalgia, unspecified: Secondary | ICD-10-CM | POA: Insufficient documentation

## 2019-01-14 NOTE — Assessment & Plan Note (Signed)
Due to restrictions of telephone visit, unable to perform physical exam.  Discussed with this patient.  Suspect muscular in etiology from lifting at work.  Recommend simple methods such as Voltaren gel, Biofreeze, Tylenol, heating pad, and ice as needed.  Will have come into office in two weeks for face to face assessment.  Return sooner for worsening.

## 2019-01-14 NOTE — Progress Notes (Signed)
BP 113/69   Pulse 74   Temp 97.9 F (36.6 C) (Oral)   Wt 165 lb (74.8 kg)   LMP  (LMP Unknown)   SpO2 97%   BMI 24.72 kg/m    Subjective:    Patient ID: Suzanne Scott, female    DOB: November 08, 1960, 58 y.o.   MRN: [id number]  HPI: Suzanne Scott is a 58 y.o. female  Chief Complaint  Patient presents with  . Back Pain    left lower back, states it has been going on for a few months. States it is not severe, just there.  . Fatigue    states she has been feeling extra tired and feeling weak for the last 4 weeks    . This visit was completed via telephone due to the restrictions of the COVID-19 pandemic. All issues as above were discussed and addressed but no physical exam was performed. If it was felt that the patient should be evaluated in the office, they were directed there. The patient verbally consented to this visit. Patient was unable to complete an audio/visual visit due to Technical difficulties,Lack of internet. Due to the catastrophic nature of the COVID-19 pandemic, this visit was done through audio contact only. . Location of the patient: work . Location of the provider: work . Those involved with this call:  . Provider: Marnee Guarneri, DNP . CMA: Yvonna Alanis, CMA . Front Desk/Registration: Jill Side  . Time spent on call: 15 minutes on the phone discussing health concerns. 10 minutes total spent in review of patient's record and preparation of their chart.  . I verified patient identity using two factors (patient name and date of birth). Patient consents verbally to being seen via telemedicine visit today.    BACK PAIN Present only to left side of lower back with occasional radiation to shoulder and left arm.  She reports this as a mild discomfort, not painful.  Has been present for a few months.  Fluctuates, she feels like it is getting longer.  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  Notices it more in evening after working all  day.   Duration: months Mechanism of injury: unknown Location: Left and low back Onset: gradual Severity: mild, 5/10 Quality: dull Frequency: intermittent Radiation: none Aggravating factors: none Alleviating factors: nothing Status: stable Treatments attempted: none  Relief with NSAIDs?: No NSAIDs Taken Nighttime pain:  no Paresthesias / decreased sensation:  no Bowel / bladder incontinence:  no Fevers:  no Dysuria / urinary frequency:  no   FATIGUE Has been tired the past 4 weeks, notices it when going up the steps after work.  Had A1C in July 6.1%, she is taking Metformin.  Taking Vit D for past low levels.   Duration:  weeks Severity: moderate  Onset: gradual Context when symptoms started:  unknown Symptoms improve with rest: yes  Depressive symptoms: no Stress/anxiety: no Insomnia: none Snoring: yes Observed apnea by bed partner: yes Daytime hypersomnolence: yes Wakes feeling refreshed: no History of sleep study: no Dysnea on exertion:  no Orthopnea/PND: no Chest pain: reports one burning episode months back Chronic cough: no Lower extremity edema: no Arthralgias:no Myalgias: no Weakness: no Rash: no  Relevant past medical, surgical, family and social history reviewed and updated as indicated. Interim medical history since our last visit reviewed. Allergies and medications reviewed and updated.  Review of Systems  Constitutional: Positive for fatigue. Negative for activity change,  appetite change, diaphoresis and fever.  Respiratory: Negative for cough, chest tightness, shortness of breath and wheezing.   Cardiovascular: Negative for chest pain, palpitations and leg swelling.  Gastrointestinal: Negative for abdominal distention, abdominal pain, constipation, diarrhea, nausea and vomiting.  Musculoskeletal: Positive for back pain.  Psychiatric/Behavioral: Negative.     Per HPI unless specifically indicated above     Objective:    BP 113/69   Pulse 74    Temp 97.9 F (36.6 C) (Oral)   Wt 165 lb (74.8 kg)   LMP  (LMP Unknown)   SpO2 97%   BMI 24.72 kg/m   Wt Readings from Last 3 Encounters:  01/14/19 165 lb (74.8 kg)  10/02/18 169 lb 11.2 oz (77 kg)  08/21/18 174 lb 9.6 oz (79.2 kg)    Physical Exam   Unable to perform due to telephone visit only.  Results for orders placed or performed in visit on 08/21/18  CBC with Differential/Platelet  Result Value Ref Range   WBC 4.2 3.4 - 10.8 x10E3/uL   RBC 4.77 3.77 - 5.28 x10E6/uL   Hemoglobin 13.1 11.1 - 15.9 g/dL   Hematocrit 40.2 34.0 - 46.6 %   MCV 84 79 - 97 fL   MCH 27.5 26.6 - 33.0 pg   MCHC 32.6 31.5 - 35.7 g/dL   RDW 14.9 11.7 - 15.4 %   Platelets 276 150 - 450 x10E3/uL   Neutrophils 53 Not Estab. %   Lymphs 35 Not Estab. %   Monocytes 9 Not Estab. %   Eos 2 Not Estab. %   Basos 1 Not Estab. %   Neutrophils Absolute 2.2 1.4 - 7.0 x10E3/uL   Lymphocytes Absolute 1.5 0.7 - 3.1 x10E3/uL   Monocytes Absolute 0.4 0.1 - 0.9 x10E3/uL   EOS (ABSOLUTE) 0.1 0.0 - 0.4 x10E3/uL   Basophils Absolute 0.0 0.0 - 0.2 x10E3/uL   Immature Granulocytes 0 Not Estab. %   Immature Grans (Abs) 0.0 0.0 - 0.1 x10E3/uL  Comprehensive metabolic panel  Result Value Ref Range   Glucose 76 65 - 99 mg/dL   BUN 13 6 - 24 mg/dL   Creatinine, Ser 0.78 0.57 - 1.00 mg/dL   GFR calc non Af Amer 83 >59 mL/min/1.73   GFR calc Af Amer 96 >59 mL/min/1.73   BUN/Creatinine Ratio 17 9 - 23   Sodium 141 134 - 144 mmol/L   Potassium 3.8 3.5 - 5.2 mmol/L   Chloride 102 96 - 106 mmol/L   CO2 27 20 - 29 mmol/L   Calcium 9.0 8.7 - 10.2 mg/dL   Total Protein 7.0 6.0 - 8.5 g/dL   Albumin 4.5 3.8 - 4.9 g/dL   Globulin, Total 2.5 1.5 - 4.5 g/dL   Albumin/Globulin Ratio 1.8 1.2 - 2.2   Bilirubin Total 0.3 0.0 - 1.2 mg/dL   Alkaline Phosphatase 66 39 - 117 IU/L   AST 24 0 - 40 IU/L   ALT 22 0 - 32 IU/L  Lipid Panel w/o Chol/HDL Ratio  Result Value Ref Range   Cholesterol, Total 237 (H) 100 - 199 mg/dL    Triglycerides 98 0 - 149 mg/dL   HDL 48 >39 mg/dL   VLDL Cholesterol Cal 20 5 - 40 mg/dL   LDL Calculated 169 (H) 0 - 99 mg/dL  TSH  Result Value Ref Range   TSH 0.589 0.450 - 4.500 uIU/mL  VITAMIN D 25 Hydroxy (Vit-D Deficiency, Fractures)  Result Value Ref Range   Vit D, 25-Hydroxy 25.1 (L)  30.0 - 100.0 ng/mL  HgB A1c  Result Value Ref Range   Hgb A1c MFr Bld 6.1 (H) 4.8 - 5.6 %   Est. average glucose Bld gHb Est-mCnc 128 mg/dL      Assessment & Plan:   Problem List Items Addressed This Visit      Other   Prediabetes    Recheck A1C outpatient.      Relevant Orders   Bayer DCA Hb A1c Waived   Vitamin D deficiency    Recheck Vit D outpatient.      Relevant Orders   Vit D  25 hydroxy (rtn osteoporosis monitoring)   Back pain - Primary    Due to restrictions of telephone visit, unable to perform physical exam.  Discussed with this patient.  Suspect muscular in etiology from lifting at work.  Recommend simple methods such as Voltaren gel, Biofreeze, Tylenol, heating pad, and ice as needed.  Will have come into office in two weeks for face to face assessment.  Return sooner for worsening.      Fatigue    Over past 4 weeks.  Will obtain outpatient labs TSH, anemia panel, CBC, CMP, and A1C.  Would benefit from sleep study in future, discussed with patient.  Have come into office in two weeks for face to face exam.      Relevant Orders   Comprehensive metabolic panel   CBC with Differential/Platelet out   Anemia panel      I discussed the assessment and treatment plan with the patient. The patient was provided an opportunity to ask questions and all were answered. The patient agreed with the plan and demonstrated an understanding of the instructions.   The patient was advised to call back or seek an in-person evaluation if the symptoms worsen or if the condition fails to improve as anticipated.   I provided 15 minutes of time during this encounter.  Follow up  plan: Return in about 2 weeks (around 01/28/2019) for Face to face for back pain and fatigue.

## 2019-01-14 NOTE — Assessment & Plan Note (Signed)
Recheck A1C outpatient.

## 2019-01-14 NOTE — Assessment & Plan Note (Signed)
Over past 4 weeks.  Will obtain outpatient labs TSH, anemia panel, CBC, CMP, and A1C.  Would benefit from sleep study in future, discussed with patient.  Have come into office in two weeks for face to face exam.

## 2019-01-14 NOTE — Assessment & Plan Note (Signed)
Recheck Vit D outpatient.

## 2019-01-14 NOTE — Patient Instructions (Signed)

## 2019-01-15 ENCOUNTER — Encounter: Payer: Self-pay | Admitting: Nurse Practitioner

## 2019-01-20 ENCOUNTER — Other Ambulatory Visit: Payer: Self-pay

## 2019-01-20 ENCOUNTER — Other Ambulatory Visit: Payer: Commercial Managed Care - PPO

## 2019-01-20 DIAGNOSIS — E559 Vitamin D deficiency, unspecified: Secondary | ICD-10-CM

## 2019-01-20 DIAGNOSIS — R7303 Prediabetes: Secondary | ICD-10-CM

## 2019-01-20 DIAGNOSIS — R5383 Other fatigue: Secondary | ICD-10-CM

## 2019-01-20 LAB — BAYER DCA HB A1C WAIVED: HB A1C (BAYER DCA - WAIVED): 5.9 % (ref <7.0)

## 2019-01-21 ENCOUNTER — Other Ambulatory Visit: Payer: Self-pay | Admitting: Nurse Practitioner

## 2019-01-22 LAB — ANEMIA PANEL
Ferritin: 248 ng/mL — ABNORMAL HIGH (ref 15–150)
Folate, Hemolysate: 455 ng/mL
Folate, RBC: 1121 ng/mL (ref >498)
Hematocrit: 40.6 % (ref 34.0–46.6)
Iron Saturation: 23 % (ref 15–55)
Iron: 70 ug/dL (ref 27–159)
Retic Ct Pct: 1.3 % (ref 0.6–2.6)
Total Iron Binding Capacity: 298 ug/dL (ref 250–450)
UIBC: 228 ug/dL (ref 131–425)
Vitamin B-12: 1094 pg/mL (ref 232–1245)

## 2019-01-22 LAB — CBC WITH DIFFERENTIAL/PLATELET
Basophils Absolute: 0 10*3/uL (ref 0.0–0.2)
Basos: 0 %
EOS (ABSOLUTE): 0.1 10*3/uL (ref 0.0–0.4)
Eos: 2 %
Hemoglobin: 13 g/dL (ref 11.1–15.9)
Immature Grans (Abs): 0 10*3/uL (ref 0.0–0.1)
Immature Granulocytes: 0 %
Lymphocytes Absolute: 2 10*3/uL (ref 0.7–3.1)
Lymphs: 35 %
MCH: 27.4 pg (ref 26.6–33.0)
MCHC: 32 g/dL (ref 31.5–35.7)
MCV: 86 fL (ref 79–97)
Monocytes Absolute: 0.4 10*3/uL (ref 0.1–0.9)
Monocytes: 7 %
Neutrophils Absolute: 3.1 10*3/uL (ref 1.4–7.0)
Neutrophils: 56 %
Platelets: 284 10*3/uL (ref 150–450)
RBC: 4.74 x10E6/uL (ref 3.77–5.28)
RDW: 14.4 % (ref 11.7–15.4)
WBC: 5.6 10*3/uL (ref 3.4–10.8)

## 2019-01-22 LAB — COMPREHENSIVE METABOLIC PANEL
ALT: 17 IU/L (ref 0–32)
AST: 21 IU/L (ref 0–40)
Albumin/Globulin Ratio: 1.7 (ref 1.2–2.2)
Albumin: 4.5 g/dL (ref 3.8–4.9)
Alkaline Phosphatase: 71 IU/L (ref 39–117)
BUN/Creatinine Ratio: 12 (ref 12–28)
BUN: 14 mg/dL (ref 8–27)
Bilirubin Total: 0.3 mg/dL (ref 0.0–1.2)
CO2: 22 mmol/L (ref 20–29)
Calcium: 9.4 mg/dL (ref 8.7–10.3)
Chloride: 104 mmol/L (ref 96–106)
Creatinine, Ser: 1.18 mg/dL — ABNORMAL HIGH (ref 0.57–1.00)
GFR calc Af Amer: 58 mL/min/{1.73_m2} — ABNORMAL LOW (ref >59)
GFR calc non Af Amer: 50 mL/min/{1.73_m2} — ABNORMAL LOW (ref >59)
Globulin, Total: 2.6 g/dL (ref 1.5–4.5)
Glucose: 88 mg/dL (ref 65–99)
Potassium: 4.3 mmol/L (ref 3.5–5.2)
Sodium: 139 mmol/L (ref 134–144)
Total Protein: 7.1 g/dL (ref 6.0–8.5)

## 2019-01-22 LAB — VITAMIN D 25 HYDROXY (VIT D DEFICIENCY, FRACTURES): Vit D, 25-Hydroxy: 34.8 ng/mL (ref 30.0–100.0)

## 2019-02-06 ENCOUNTER — Ambulatory Visit: Payer: Commercial Managed Care - PPO | Attending: Internal Medicine

## 2019-02-06 DIAGNOSIS — Z20822 Contact with and (suspected) exposure to covid-19: Secondary | ICD-10-CM

## 2019-02-08 LAB — NOVEL CORONAVIRUS, NAA: SARS-CoV-2, NAA: NOT DETECTED

## 2019-02-10 ENCOUNTER — Ambulatory Visit: Payer: Commercial Managed Care - PPO | Attending: Internal Medicine

## 2019-02-10 DIAGNOSIS — Z20822 Contact with and (suspected) exposure to covid-19: Secondary | ICD-10-CM

## 2019-02-11 LAB — NOVEL CORONAVIRUS, NAA: SARS-CoV-2, NAA: NOT DETECTED

## 2019-02-17 ENCOUNTER — Ambulatory Visit (INDEPENDENT_AMBULATORY_CARE_PROVIDER_SITE_OTHER): Payer: Commercial Managed Care - PPO | Admitting: Nurse Practitioner

## 2019-02-17 ENCOUNTER — Encounter: Payer: Self-pay | Admitting: Nurse Practitioner

## 2019-02-17 ENCOUNTER — Other Ambulatory Visit: Payer: Self-pay

## 2019-02-17 VITALS — BP 130/71 | HR 61 | Temp 98.1°F

## 2019-02-17 DIAGNOSIS — R079 Chest pain, unspecified: Secondary | ICD-10-CM | POA: Diagnosis not present

## 2019-02-17 DIAGNOSIS — F324 Major depressive disorder, single episode, in partial remission: Secondary | ICD-10-CM | POA: Diagnosis not present

## 2019-02-17 DIAGNOSIS — R5383 Other fatigue: Secondary | ICD-10-CM

## 2019-02-17 DIAGNOSIS — E782 Mixed hyperlipidemia: Secondary | ICD-10-CM

## 2019-02-17 DIAGNOSIS — R030 Elevated blood-pressure reading, without diagnosis of hypertension: Secondary | ICD-10-CM

## 2019-02-17 NOTE — Assessment & Plan Note (Signed)
Ongoing with occasional palpitations & CP reported.  EKG sinus brady in office today with irregularity.  Will repeat CMP, CBC, lipid panel today + add on ESR and CRP.  Low suspicion for this being cardiac in etiology, however patient would like referral to cardiology for further evaluation.  Referral placed.  Return in one week.

## 2019-02-17 NOTE — Assessment & Plan Note (Signed)
Ongoing with no current statin therapy.  Check lipid panel and CMP today.

## 2019-02-17 NOTE — Assessment & Plan Note (Signed)
Chronic, ongoing.  Suspect current CP and fatigue may be related to mood.  Some increase in GAD score noted.  Have recommended patient trial increase in Celexa to 40 MG daily, she has 20 MG tablets left at home and will start taking 2 a day.  IF beneficial, will write a new script for 40 MG daily.   Denies SI/HI.   Return in one week.

## 2019-02-17 NOTE — Progress Notes (Signed)
BP 130/71 (BP Location: Left Arm, Patient Position: Sitting, Cuff Size: Normal)   Pulse 61   Temp 98.1 F (36.7 C) (Oral)   LMP  (LMP Unknown)   SpO2 96%    Subjective:    Patient ID: Suzanne Scott, female    DOB: 27-Oct-1960, 59 y.o.   MRN: [id number]  HPI: Suzanne Scott is a 59 y.o. female  Chief Complaint  Patient presents with  . Follow-up    6 months HTN/HLD & Mood.   HYPERTENSION / HYPERLIPIDEMIA Currently no medication for BP or cholesterol.  Reports checking BP occasionally at work and these are <130/90.  Heart rate has been in 90-100 range at work.  No family history of strokes or MI.  Reports intermittent chest pain over past 2 weeks, more to right side.  Notices it more in the afternoon, but at baseline is intermittent throughout day.  Then at night at times will have it to left side chest pain with numbness down left arm, at baseline does lie on left side and has had issues with arm "falling asleep" before because of this.  Has ongoing fatigue, notices this more the latter part of day or if going up steps.  Her last labs showed A1C 5.9% and TSH 0.589. She reports her partner does notice she snores.  Has never had sleep testing.   Satisfied with current treatment? yes Duration of hypertension: chronic BP monitoring frequency: rarely BP range: <130/90 Cholesterol supplements: none Aspirin: no Recent stressors: no Recurrent headaches: occasional Visual changes: seeing eye doctor for this Palpitations: yes, has episodes lasting total 5 minutes, notices this about 2-3 times a week Dyspnea: yes Chest pain: yes Lower extremity edema: no Dizzy/lightheaded: no  The 10-year ASCVD risk score [name], et al., 2013) is: 6.8%   Values used to calculate the score:     Age: 45 years     Sex: Female     Is Non-Hispanic African American: Yes     Diabetic: No     Tobacco smoker: No     Systolic Blood Pressure: 326 mmHg     Is BP treated: No     HDL Cholesterol: 48  mg/dL     Total Cholesterol: 237 mg/dL   DEPRESSION Continues on Celexa 20 MG.  Episodes noted above under HTN are similar to past prior to her starting Celexa, states in past she had palpitations, CP, and SOB with some anxiety. Mood status: controlled Satisfied with current treatment?: yes Symptom severity: mild  Duration of current treatment : chronic Side effects: no Medication compliance: good compliance Psychotherapy/counseling: none Previous psychiatric medications: Celexa Depressed mood: no Anxious mood: no Anhedonia: no Significant weight loss or gain: no Insomnia: none Fatigue: no Feelings of worthlessness or guilt: no Impaired concentration/indecisiveness: no Suicidal ideations: no Hopelessness: no Crying spells: no Depression screen [facility] 2/9 02/17/2019 10/02/2018 08/21/2018 04/16/2018 09/17/2017  Decreased Interest 0 0 0 0 0  Down, Depressed, Hopeless 0 0 0 0 0  PHQ - 2 Score 0 0 0 0 0  Altered sleeping 0 - 0 1 3  Tired, decreased energy 0 - 1 0 2  Change in appetite 1 - 0 1 2  Feeling bad or failure about yourself  0 - 0 0 0  Trouble concentrating 3 - '3 1 3  ' Moving slowly or fidgety/restless 0 - 0 0 0  Suicidal thoughts 0 - 0 0 0  PHQ-9 Score 4 - 4 3 10  Difficult doing work/chores - - Not difficult at all Not difficult at all -  Some recent data might be hidden   GAD 7 : Generalized Anxiety Score 02/17/2019 08/21/2018 04/16/2018  Nervous, Anxious, on Edge 1 1 0  Control/stop worrying '1 1 1  ' Worry too much - different things '2 1 1  ' Trouble relaxing '2 3 1  ' Restless '2 1 1  ' Easily annoyed or irritable '2 1 1  ' Afraid - awful might happen 3 2 0  Total GAD 7 Score '13 10 5  ' Anxiety Difficulty Not difficult at all Not difficult at all Somewhat difficult    Relevant past medical, surgical, family and social history reviewed and updated as indicated. Interim medical history since our last visit reviewed. Allergies and medications reviewed and updated.  Review of Systems    Constitutional: Positive for fatigue. Negative for activity change, appetite change, diaphoresis and fever.  Respiratory: Positive for shortness of breath. Negative for cough and chest tightness.   Cardiovascular: Positive for chest pain and palpitations. Negative for leg swelling.  Gastrointestinal: Negative.   Endocrine: Negative.   Neurological: Negative.   Psychiatric/Behavioral: Negative.     Per HPI unless specifically indicated above     Objective:    BP 130/71 (BP Location: Left Arm, Patient Position: Sitting, Cuff Size: Normal)   Pulse 61   Temp 98.1 F (36.7 C) (Oral)   LMP  (LMP Unknown)   SpO2 96%   Wt Readings from Last 3 Encounters:  01/14/19 165 lb (74.8 kg)  10/02/18 169 lb 11.2 oz (77 kg)  08/21/18 174 lb 9.6 oz (79.2 kg)    Physical Exam Vitals and nursing note reviewed.  Constitutional:      General: She is awake. She is not in acute distress.    Appearance: She is well-developed. She is not ill-appearing.  HENT:     Head: Normocephalic.     Right Ear: Hearing normal.     Left Ear: Hearing normal.  Eyes:     General: Lids are normal.        Right eye: No discharge.        Left eye: No discharge.     Conjunctiva/sclera: Conjunctivae normal.     Pupils: Pupils are equal, round, and reactive to light.  Neck:     Thyroid: No thyromegaly.     Vascular: No carotid bruit or JVD.  Cardiovascular:     Rate and Rhythm: Normal rate and regular rhythm.     Heart sounds: Normal heart sounds. No murmur. No gallop.   Pulmonary:     Effort: Pulmonary effort is normal. No accessory muscle usage or respiratory distress.     Breath sounds: Normal breath sounds.  Abdominal:     General: Bowel sounds are normal.     Palpations: Abdomen is soft.  Musculoskeletal:     Cervical back: Normal range of motion and neck supple.     Right lower leg: No edema.     Left lower leg: No edema.  Lymphadenopathy:     Head:     Right side of head: No submental, submandibular,  tonsillar, preauricular or posterior auricular adenopathy.     Left side of head: No submental, submandibular, tonsillar, preauricular or posterior auricular adenopathy.  Skin:    General: Skin is warm and dry.  Neurological:     Mental Status: She is alert and oriented to person, place, and time.  Psychiatric:        Attention and  Perception: Attention normal.        Mood and Affect: Mood normal.        Behavior: Behavior normal. Behavior is cooperative.        Thought Content: Thought content normal.        Judgment: Judgment normal.    EKG IN OFFICE: Rate 56 with normal axis, sinus bradycardia  Results for orders placed or performed in visit on 02/10/19  Novel Coronavirus, NAA ([facility])   Specimen: Nasopharyngeal(NP) swabs in vial transport medium   NASOPHARYNGE  TESTING  Result Value Ref Range   SARS-CoV-2, NAA Not Detected Not Detected      Assessment & Plan:   Problem List Items Addressed This Visit      Other   Hyperlipidemia    Ongoing with no current statin therapy.  Check lipid panel and CMP today.      Depression    Chronic, ongoing.  Suspect current CP and fatigue may be related to mood.  Some increase in GAD score noted.  Have recommended patient trial increase in Celexa to 40 MG daily, she has 20 MG tablets left at home and will start taking 2 a day.  IF beneficial, will write a new script for 40 MG daily.   Denies SI/HI.   Return in one week.        Fatigue    Ongoing with occasional palpitations & CP reported.  EKG sinus brady in office today with irregularity.  Will repeat CMP, CBC, lipid panel today + add on ESR and CRP.  Low suspicion for this being cardiac in etiology, however patient would like referral to cardiology for further evaluation.  Referral placed.  Return in one week.      Relevant Orders   C-reactive protein   Sed Rate (ESR)   Ambulatory referral to Cardiology   Elevated BP without diagnosis of hypertension    Ongoing with home BP and  office BP below goal today.  No current medications.  Continue focus on DASH diet and regular exercise.         Other Visit Diagnoses    Chest pain, unspecified type    -  Primary   Referral to cardiology placed and reviewed s/s for patient to immediately go to ER for.   Relevant Orders   EKG 12-Lead   CBC with Differential/Platelet out   Comprehensive metabolic panel   Lipid Panel w/o Chol/HDL Ratio out   EKG 12-Lead (Completed)   Ambulatory referral to Cardiology       Follow up plan: Return in about 1 week (around 02/24/2019) for Fatigue and CP.

## 2019-02-17 NOTE — Patient Instructions (Signed)

## 2019-02-17 NOTE — Assessment & Plan Note (Signed)
Ongoing with home BP and office BP below goal today.  No current medications.  Continue focus on DASH diet and regular exercise.

## 2019-02-18 LAB — CBC WITH DIFFERENTIAL/PLATELET
Basophils Absolute: 0 10*3/uL (ref 0.0–0.2)
Basos: 1 %
EOS (ABSOLUTE): 0.1 10*3/uL (ref 0.0–0.4)
Eos: 2 %
Hematocrit: 40.8 % (ref 34.0–46.6)
Hemoglobin: 13.2 g/dL (ref 11.1–15.9)
Immature Grans (Abs): 0 10*3/uL (ref 0.0–0.1)
Immature Granulocytes: 0 %
Lymphocytes Absolute: 1.3 10*3/uL (ref 0.7–3.1)
Lymphs: 37 %
MCH: 27.6 pg (ref 26.6–33.0)
MCHC: 32.4 g/dL (ref 31.5–35.7)
MCV: 85 fL (ref 79–97)
Monocytes Absolute: 0.3 10*3/uL (ref 0.1–0.9)
Monocytes: 8 %
Neutrophils Absolute: 1.9 10*3/uL (ref 1.4–7.0)
Neutrophils: 52 %
Platelets: 302 10*3/uL (ref 150–450)
RBC: 4.78 x10E6/uL (ref 3.77–5.28)
RDW: 14.4 % (ref 11.7–15.4)
WBC: 3.6 10*3/uL (ref 3.4–10.8)

## 2019-02-18 LAB — COMPREHENSIVE METABOLIC PANEL
ALT: 21 IU/L (ref 0–32)
AST: 18 IU/L (ref 0–40)
Albumin/Globulin Ratio: 1.4 (ref 1.2–2.2)
Albumin: 4.1 g/dL (ref 3.8–4.9)
Alkaline Phosphatase: 69 IU/L (ref 39–117)
BUN/Creatinine Ratio: 11 — ABNORMAL LOW (ref 12–28)
BUN: 8 mg/dL (ref 8–27)
Bilirubin Total: 0.3 mg/dL (ref 0.0–1.2)
CO2: 27 mmol/L (ref 20–29)
Calcium: 9 mg/dL (ref 8.7–10.3)
Chloride: 102 mmol/L (ref 96–106)
Creatinine, Ser: 0.71 mg/dL (ref 0.57–1.00)
GFR calc Af Amer: 107 mL/min/{1.73_m2} (ref >59)
GFR calc non Af Amer: 93 mL/min/{1.73_m2} (ref >59)
Globulin, Total: 2.9 g/dL (ref 1.5–4.5)
Glucose: 91 mg/dL (ref 65–99)
Potassium: 4.3 mmol/L (ref 3.5–5.2)
Sodium: 139 mmol/L (ref 134–144)
Total Protein: 7 g/dL (ref 6.0–8.5)

## 2019-02-18 LAB — LIPID PANEL W/O CHOL/HDL RATIO
Cholesterol, Total: 237 mg/dL — ABNORMAL HIGH (ref 100–199)
HDL: 45 mg/dL (ref >39)
LDL Chol Calc (NIH): 172 mg/dL — ABNORMAL HIGH (ref 0–99)
Triglycerides: 113 mg/dL (ref 0–149)
VLDL Cholesterol Cal: 20 mg/dL (ref 5–40)

## 2019-02-18 LAB — SEDIMENTATION RATE: Sed Rate: 24 mm/hr (ref 0–40)

## 2019-02-18 LAB — C-REACTIVE PROTEIN: CRP: 4 mg/L (ref 0–10)

## 2019-02-18 NOTE — Progress Notes (Signed)
Contacted via MyChart The 10-year ASCVD risk score [name], et al., 2013) is: 7.1%   Values used to calculate the score:     Age: 59 years     Sex: Female     Is Non-Hispanic African American: Yes     Diabetic: No     Tobacco smoker: No     Systolic Blood Pressure: [id number] mmHg     Is BP treated: No     HDL Cholesterol: 45 mg/dL     Total Cholesterol: 237 mg/dL

## 2019-02-24 ENCOUNTER — Encounter: Payer: Self-pay | Admitting: Nurse Practitioner

## 2019-02-24 ENCOUNTER — Ambulatory Visit: Payer: Commercial Managed Care - PPO | Admitting: Nurse Practitioner

## 2019-02-24 ENCOUNTER — Ambulatory Visit (INDEPENDENT_AMBULATORY_CARE_PROVIDER_SITE_OTHER): Payer: Commercial Managed Care - PPO | Admitting: Nurse Practitioner

## 2019-02-24 VITALS — BP 125/56 | HR 78 | Temp 97.4°F | Wt 167.2 lb

## 2019-02-24 DIAGNOSIS — R5383 Other fatigue: Secondary | ICD-10-CM | POA: Diagnosis not present

## 2019-02-24 DIAGNOSIS — R002 Palpitations: Secondary | ICD-10-CM

## 2019-02-24 MED ORDER — CITALOPRAM HYDROBROMIDE 40 MG PO TABS: 40.0000 mg | ORAL_TABLET | Freq: Every day | ORAL | 3 refills | Status: DC

## 2019-02-24 NOTE — Patient Instructions (Signed)
Fatigue If you have fatigue, you feel tired all the time and have a lack of energy or a lack of motivation. Fatigue may make it difficult to start or complete tasks because of exhaustion. In general, occasional or mild fatigue is often a normal response to activity or life. However, long-lasting (chronic) or extreme fatigue may be a symptom of a medical condition. Follow these instructions at home: General instructions  Watch your fatigue for any changes.  Go to bed and get up at the same time every day.  Avoid fatigue by pacing yourself during the day and getting enough sleep at night.  Maintain a healthy weight. Medicines  Take over-the-counter and prescription medicines only as told by your health care Suzanne Scott.  Take a multivitamin, if told by your health care Suzanne Scott.  Do not use herbal or dietary supplements unless they are approved by your health care Suzanne Scott. Activity   Exercise regularly, as told by your health care Suzanne Scott.  Use or practice techniques to help you relax, such as yoga, tai chi, meditation, or massage therapy. Eating and drinking   Avoid heavy meals in the evening.  Eat a well-balanced diet, which includes lean proteins, whole grains, plenty of fruits and vegetables, and low-fat dairy products.  Avoid consuming too much caffeine.  Avoid the use of alcohol.  Drink enough fluid to keep your urine pale yellow. Lifestyle  Change situations that cause you stress. Try to keep your work and personal schedule in balance.  Do not use any products that contain nicotine or tobacco, such as cigarettes and e-cigarettes. If you need help quitting, ask your health care Suzanne Scott.  Do not use drugs. Contact a health care Suzanne Scott if:  Your fatigue does not get better.  You have a fever.  You suddenly lose or gain weight.  You have headaches.  You have trouble falling asleep or sleeping through the night.  You feel angry, guilty, anxious, or sad.   You are unable to have a bowel movement (constipation).  Your skin is dry.  You have swelling in your legs or another part of your body. Get help right away if:  You feel confused.  Your vision is blurry.  You feel faint or you pass out.  You have a severe headache.  You have severe pain in your abdomen, your back, or the area between your waist and hips (pelvis).  You have chest pain, shortness of breath, or an irregular or fast heartbeat.  You are unable to urinate, or you urinate less than normal.  You have abnormal bleeding, such as bleeding from the rectum, vagina, nose, lungs, or nipples.  You vomit blood.  You have thoughts about hurting yourself or others. If you ever feel like you may hurt yourself or others, or have thoughts about taking your own life, get help right away. You can go to your nearest emergency department or call:  Your local emergency services (911 in the [place]  A suicide crisis helpline, such as the [place] at (505)831-7817. This is open 24 hours a day. Summary  If you have fatigue, you feel tired all the time and have a lack of energy or a lack of motivation.  Fatigue may make it difficult to start or complete tasks because of exhaustion.  Long-lasting (chronic) or extreme fatigue may be a symptom of a medical condition.  Exercise regularly, as told by your health care Suzanne Scott.  Change situations that cause you stress. Try to keep your  work and personal schedule in balance. This information is not intended to replace advice given to you by your health care Suzanne Scott. Make sure you discuss any questions you have with your health care Suzanne Scott. Document Revised: 08/07/2018 Document Reviewed: 10/11/2016 Elsevier Patient Education  2020 [employer].

## 2019-02-24 NOTE — Progress Notes (Signed)
BP (!) 125/56   Pulse 78   Temp (!) 97.4 F (36.3 C)   Wt 167 lb 4 oz (75.9 kg)   LMP  (LMP Unknown)   SpO2 98%   BMI 25.06 kg/m    Subjective:    Patient ID: Suzanne Scott, female    DOB: 1960-05-24, 59 y.o.   MRN: [id number]  HPI: Suzanne Scott is a 59 y.o. female  Chief Complaint  Patient presents with  . Fatigue  . Chest Pain    . This visit was completed via telephone due to the restrictions of the COVID-19 pandemic. All issues as above were discussed and addressed but no physical exam was performed. If it was felt that the patient should be evaluated in the office, they were directed there. The patient verbally consented to this visit. Patient was unable to complete an audio/visual visit due to Lack of equipment. Due to the catastrophic nature of the COVID-19 pandemic, this visit was done through audio contact only. . Location of the patient: home . Location of the provider: work . Those involved with this call:  . Provider: Marnee Guarneri, DNP . CMA: Yvonna Alanis, CMA . Front Desk/Registration: Don Perking  . Time spent on call: 15 minutes on the phone discussing health concerns. 10 minutes total spent in review of patient's record and preparation of their chart.  . I verified patient identity using two factors (patient name and date of birth). Patient consents verbally to being seen via telemedicine visit today.    HYPERTENSION / HYPERLIPIDEMIA Seen on 02/17/2019 for similar, follow-up today.  Currently no medication for BP or cholesterol. Reports checking BP occasionally at work and these are <130/90.  Heart rate has been in 90-100 range at work.  No family history of strokes or MI.  Reports intermittent chest pain over past 2 weeks, more to right side.  Notices it more in the afternoon, but at baseline is intermittent throughout day.  Then at night at times will have it to left side chest pain with numbness down left arm, at baseline does lie on  left side and has had issues with arm paresthesia before because of this.  Has ongoing fatigue, notices this more the latter part of day or if going up steps.  Her last labs showed A1C 5.9% and TSH 0.589. She reports her partner does notice she snores.  Has never had sleep testing, but is agreeable to this referral.  Last visit placed referral to cardiology for further evaluation, she is going to see them next Tuesday.  At last visit increased Celexa, due to her having similar symptoms in past with anxiety.  She reports this helped, but continues to have occasional palpitations. Satisfied with current treatment? yes Duration of hypertension: chronic BP monitoring frequency: rarely BP range: <130/90 Cholesterol supplements: none Aspirin: no Recent stressors: no Recurrent headaches: occasional Visual changes: seeing eye doctor for this Palpitations: yes, has episodes lasting total 5 minutes, notices this about 2-3 times a week Dyspnea: yes Chest pain: yes Lower extremity edema: no Dizzy/lightheaded: no  The 10-year ASCVD risk score [name], et al., 2013) is: 6.3%   Values used to calculate the score:     Age: 36 years     Sex: Female     Is Non-Hispanic African American: Yes     Diabetic: No     Tobacco smoker: No     Systolic Blood Pressure: 993 mmHg     Is  BP treated: No     HDL Cholesterol: 45 mg/dL     Total Cholesterol: 237 mg/dL\  Relevant past medical, surgical, family and social history reviewed and updated as indicated. Interim medical history since our last visit reviewed. Allergies and medications reviewed and updated.  Review of Systems  Constitutional: Positive for fatigue. Negative for activity change, appetite change, diaphoresis and fever.  Respiratory: Positive for shortness of breath. Negative for cough and chest tightness.   Cardiovascular: Positive for palpitations. Negative for leg swelling.  Gastrointestinal: Negative.   Endocrine: Negative.    Neurological: Negative.   Psychiatric/Behavioral: Negative.     Per HPI unless specifically indicated above     Objective:    BP (!) 125/56   Pulse 78   Temp (!) 97.4 F (36.3 C)   Wt 167 lb 4 oz (75.9 kg)   LMP  (LMP Unknown)   SpO2 98%   BMI 25.06 kg/m   Wt Readings from Last 3 Encounters:  02/24/19 167 lb 4 oz (75.9 kg)  01/14/19 165 lb (74.8 kg)  10/02/18 169 lb 11.2 oz (77 kg)    Physical Exam Vitals and nursing note reviewed.  Constitutional:      General: She is awake. She is not in acute distress.    Appearance: She is well-developed. She is not ill-appearing.  HENT:     Head: Normocephalic.     Right Ear: Hearing normal.     Left Ear: Hearing normal.  Eyes:     General: Lids are normal.        Right eye: No discharge.        Left eye: No discharge.     Conjunctiva/sclera: Conjunctivae normal.  Pulmonary:     Effort: Pulmonary effort is normal. No accessory muscle usage or respiratory distress.  Musculoskeletal:     Cervical back: Normal range of motion.  Neurological:     Mental Status: She is alert and oriented to person, place, and time.  Psychiatric:        Attention and Perception: Attention normal.        Mood and Affect: Mood normal.        Behavior: Behavior normal. Behavior is cooperative.        Thought Content: Thought content normal.        Judgment: Judgment normal.     Results for orders placed or performed in visit on 02/17/19  C-reactive protein  Result Value Ref Range   CRP 4 0 - 10 mg/L  Sed Rate (ESR)  Result Value Ref Range   Sed Rate 24 0 - 40 mm/hr  CBC with Differential/Platelet out  Result Value Ref Range   WBC 3.6 3.4 - 10.8 x10E3/uL   RBC 4.78 3.77 - 5.28 x10E6/uL   Hemoglobin 13.2 11.1 - 15.9 g/dL   Hematocrit 40.8 34.0 - 46.6 %   MCV 85 79 - 97 fL   MCH 27.6 26.6 - 33.0 pg   MCHC 32.4 31.5 - 35.7 g/dL   RDW 14.4 11.7 - 15.4 %   Platelets 302 150 - 450 x10E3/uL   Neutrophils 52 Not Estab. %   Lymphs 37 Not  Estab. %   Monocytes 8 Not Estab. %   Eos 2 Not Estab. %   Basos 1 Not Estab. %   Neutrophils Absolute 1.9 1.4 - 7.0 x10E3/uL   Lymphocytes Absolute 1.3 0.7 - 3.1 x10E3/uL   Monocytes Absolute 0.3 0.1 - 0.9 x10E3/uL   EOS (ABSOLUTE) 0.1 0.0 -   0.4 x10E3/uL   Basophils Absolute 0.0 0.0 - 0.2 x10E3/uL   Immature Granulocytes 0 Not Estab. %   Immature Grans (Abs) 0.0 0.0 - 0.1 x10E3/uL  Comprehensive metabolic panel  Result Value Ref Range   Glucose 91 65 - 99 mg/dL   BUN 8 8 - 27 mg/dL   Creatinine, Ser 0.71 0.57 - 1.00 mg/dL   GFR calc non Af Amer 93 >59 mL/min/1.73   GFR calc Af Amer 107 >59 mL/min/1.73   BUN/Creatinine Ratio 11 (L) 12 - 28   Sodium 139 134 - 144 mmol/L   Potassium 4.3 3.5 - 5.2 mmol/L   Chloride 102 96 - 106 mmol/L   CO2 27 20 - 29 mmol/L   Calcium 9.0 8.7 - 10.3 mg/dL   Total Protein 7.0 6.0 - 8.5 g/dL   Albumin 4.1 3.8 - 4.9 g/dL   Globulin, Total 2.9 1.5 - 4.5 g/dL   Albumin/Globulin Ratio 1.4 1.2 - 2.2   Bilirubin Total 0.3 0.0 - 1.2 mg/dL   Alkaline Phosphatase 69 39 - 117 IU/L   AST 18 0 - 40 IU/L   ALT 21 0 - 32 IU/L  Lipid Panel w/o Chol/HDL Ratio out  Result Value Ref Range   Cholesterol, Total 237 (H) 100 - 199 mg/dL   Triglycerides 113 0 - 149 mg/dL   HDL 45 >39 mg/dL   VLDL Cholesterol Cal 20 5 - 40 mg/dL   LDL Chol Calc (NIH) 172 (H) 0 - 99 mg/dL      Assessment & Plan:   Problem List Items Addressed This Visit      Other   Fatigue - Primary    Chronic, ongoing.  Recent labs WNL.  Does endorse snoring and no previous sleep study.  Will place referral for sleep study.          Relevant Orders   Ambulatory referral to Sleep Studies   Palpitations    With fatigue and occasional CP reported, some improvement in CP with increase in Celexa, but endorses continued intermittent palpitations.  Sees cardiology next week.  Will review notes once available.  Referral for sleep study placed, patient endorses snoring.  Have return after seeing  cardiology for follow-up.  Is aware of s/s to immediately report to ER for.         I discussed the assessment and treatment plan with the patient. The patient was provided an opportunity to ask questions and all were answered. The patient agreed with the plan and demonstrated an understanding of the instructions.   The patient was advised to call back or seek an in-person evaluation if the symptoms worsen or if the condition fails to improve as anticipated.   I provided 15 minutes of time during this encounter.  Follow up plan: Return in about 4 weeks (around 03/24/2019). 

## 2019-02-24 NOTE — Assessment & Plan Note (Signed)
With fatigue and occasional CP reported, some improvement in CP with increase in Celexa, but endorses continued intermittent palpitations.  Sees cardiology next week.  Will review notes once available.  Referral for sleep study placed, patient endorses snoring.  Have return after seeing cardiology for follow-up.  Is aware of s/s to immediately report to ER for.

## 2019-02-24 NOTE — Assessment & Plan Note (Signed)
Chronic, ongoing.  Recent labs WNL.  Does endorse snoring and no previous sleep study.  Will place referral for sleep study.

## 2019-03-04 ENCOUNTER — Ambulatory Visit (INDEPENDENT_AMBULATORY_CARE_PROVIDER_SITE_OTHER): Payer: Commercial Managed Care - PPO | Admitting: Cardiovascular Disease

## 2019-03-04 ENCOUNTER — Encounter: Payer: Self-pay | Admitting: Cardiovascular Disease

## 2019-03-04 ENCOUNTER — Other Ambulatory Visit: Payer: Self-pay

## 2019-03-04 VITALS — BP 130/74 | HR 64 | Ht 69.0 in | Wt 170.0 lb

## 2019-03-04 DIAGNOSIS — R002 Palpitations: Secondary | ICD-10-CM | POA: Diagnosis not present

## 2019-03-04 DIAGNOSIS — E782 Mixed hyperlipidemia: Secondary | ICD-10-CM | POA: Diagnosis not present

## 2019-03-04 DIAGNOSIS — R072 Precordial pain: Secondary | ICD-10-CM | POA: Diagnosis not present

## 2019-03-04 MED ORDER — METOPROLOL TARTRATE 100 MG PO TABS: ORAL_TABLET | ORAL | 0 refills | Status: DC

## 2019-03-04 NOTE — Patient Instructions (Addendum)
Medication Instructions:  - Your physician recommends that you continue on your current medications as directed. Please refer to the Current Medication list given to you today.  *If you need a refill on your cardiac medications before your next appointment, please call your pharmacy*  Lab Work: - none ordered  If you have labs (blood work) drawn today and your tests are completely normal, you will receive your results only by: Marland Kitchen MyChart Message (if you have MyChart) OR . A paper copy in the mail If you have any lab test that is abnormal or we need to change your treatment, we will call you to review the results.  Testing/Procedures: - Your physician has requested that you have cardiac CT. Cardiac computed tomography (CT) is a painless test that uses an x-ray machine to take clear, detailed pictures of your heart.  Your cardiac CT will be scheduled at one of the below locations:   [facility] [address] [place], [place] [address] (336) [place] [address] [place], [place] [address] [phone]  If scheduled at [facility], please arrive at the [facility] main entrance of [facility] 30-45 minutes prior to test start time. Proceed to the [facility] Radiology Department (first floor) to check-in and test prep.  If scheduled at [facility], please arrive 15 mins early for check-in and test prep.  Please follow these instructions carefully (unless otherwise directed):  On the Night Before the Test: . Be sure to Drink plenty of water. . Do not consume any caffeinated/decaffeinated beverages or chocolate 12 hours prior to your test. . Do not take any antihistamines 12 hours prior to your test.   On the Day of the Test: . Drink plenty of water. Do not drink any water within one hour of the test. . Do not eat any food 4 hours prior to the test. . You may  take your regular medications prior to the test.  . Take metoprolol (Lopressor) 100 mg x 1 dose two hours prior to test. . HOLD Furosemide/Hydrochlorothiazide morning of the test. . FEMALES- please wear underwire-free bra if available  After the Test: . Drink plenty of water. . After receiving IV contrast, you may experience a mild flushed feeling. This is normal. . On occasion, you may experience a mild rash up to 24 hours after the test. This is not dangerous. If this occurs, you can take Benadryl 25 mg and increase your fluid intake. . If you experience trouble breathing, this can be serious. If it is severe call 911 IMMEDIATELY. If it is mild, please call our office. . If you take any of these medications: Glipizide/Metformin, Avandament, Glucavance, please do not take 48 hours after completing test unless otherwise instructed.   Once we have confirmed authorization from your insurance company, we will call you to set up a date and time for your test.   For non-scheduling related questions, please contact the cardiac imaging nurse navigator should you have any questions/concerns: Marchia Bond, RN Navigator Cardiac Imaging [facility] [phone] Office    Follow-Up: At [facility], you and your health needs are our priority.  As part of our continuing mission to provide you with exceptional heart care, we have created designated Provider Care Teams.  These Care Teams include your primary Cardiologist (physician) and Advanced Practice Providers (APPs -  Physician Assistants and Nurse Practitioners) who all work  together to provide you with the care you need, when you need it.  Your next appointment:   As needed   The format for your next appointment:   as needed  Provider:    You may see Kathlyn Sacramento, MD or one of the following Advanced Practice Providers on your designated Care Team:    Murray Hodgkins, NP  Christell Faith, PA-C  Marrianne Mood, PA-C   Other Instructions - n/a   Cardiac CT Angiogram A cardiac CT angiogram is a procedure to look at the heart and the area around the heart. It may be done to help find the cause of chest pains or other symptoms of heart disease. During this procedure, a substance called contrast dye is injected into the blood vessels in the area to be checked. A large X-ray machine, called a CT scanner, then takes detailed pictures of the heart and the surrounding area. The procedure is also sometimes called a coronary CT angiogram, coronary artery scanning, or CTA. A cardiac CT angiogram allows the health care provider to see how well blood is flowing to and from the heart. The health care provider will be able to see if there are any problems, such as:  Blockage or narrowing of the coronary arteries in the heart.  Fluid around the heart.  Signs of weakness or disease in the muscles, valves, and tissues of the heart. Tell a health care provider about:  Any allergies you have. This is especially important if you have had a previous allergic reaction to contrast dye.  All medicines you are taking, including vitamins, herbs, eye drops, creams, and over-the-counter medicines.  Any blood disorders you have.  Any surgeries you have had.  Any medical conditions you have.  Whether you are pregnant or may be pregnant.  Any anxiety disorders, chronic pain, or other conditions you have that may increase your stress or prevent you from lying still. What are the risks? Generally, this is a safe procedure. However, problems may occur, including:  Bleeding.  Infection.  Allergic reactions to medicines or dyes.  Damage to other structures or organs.  Kidney damage from the contrast dye that is used.  Increased risk of cancer from radiation exposure. This risk is low. Talk with your health care provider about: ? The risks and benefits of testing. ? How you can receive the lowest dose of  radiation. What happens before the procedure?  Wear comfortable clothing and remove any jewelry, glasses, dentures, and hearing aids.  Follow instructions from your health care provider about eating and drinking. This may include: ? For 12 hours before the procedure -- avoid caffeine. This includes tea, coffee, soda, energy drinks, and diet pills. Drink plenty of water or other fluids that do not have caffeine in them. Being well hydrated can prevent complications. ? For 4-6 hours before the procedure -- stop eating and drinking. The contrast dye can cause nausea, but this is less likely if your stomach is empty.  Ask your health care provider about changing or stopping your regular medicines. This is especially important if you are taking diabetes medicines, blood thinners, or medicines to treat problems with erections (erectile dysfunction). What happens during the procedure?   Hair on your chest may need to be removed so that small sticky patches called electrodes can be placed on your chest. These will transmit information that helps to monitor your heart during the procedure.  An IV will be inserted into one of your veins.  You might be given a medicine to control your heart rate during the procedure. This will help to ensure that good images are obtained.  You will be asked to lie on an exam table. This table will slide in and out of the CT machine during the procedure.  Contrast dye will be injected into the IV. You might feel warm, or you may get a metallic taste in your mouth.  You will be given a medicine called nitroglycerin. This will relax or dilate the arteries in your heart.  The table that you are lying on will move into the CT machine tunnel for the scan.  The person running the machine will give you instructions while the scans are being done. You may be asked to: ? Keep your arms above your head. ? Hold your breath. ? Stay very still, even if the table is  moving.  When the scanning is complete, you will be moved out of the machine.  The IV will be removed. The procedure may vary among health care providers and hospitals. What can I expect after the procedure? After your procedure, it is common to have:  A metallic taste in your mouth from the contrast dye.  A feeling of warmth.  A headache from the nitroglycerin. Follow these instructions at home:  Take over-the-counter and prescription medicines only as told by your health care provider.  If you are told, drink enough fluid to keep your urine pale yellow. This will help to flush the contrast dye out of your body.  Most people can return to their normal activities right after the procedure. Ask your health care provider what activities are safe for you.  It is up to you to get the results of your procedure. Ask your health care provider, or the department that is doing the procedure, when your results will be ready.  Keep all follow-up visits as told by your health care provider. This is important. Contact a health care provider if:  You have any symptoms of allergy to the contrast dye. These include: ? Shortness of breath. ? Rash or hives. ? A racing heartbeat. Summary  A cardiac CT angiogram is a procedure to look at the heart and the area around the heart. It may be done to help find the cause of chest pains or other symptoms of heart disease.  During this procedure, a large X-ray machine, called a CT scanner, takes detailed pictures of the heart and the surrounding area after a contrast dye has been injected into blood vessels in the area.  Ask your health care provider about changing or stopping your regular medicines before the procedure. This is especially important if you are taking diabetes medicines, blood thinners, or medicines to treat erectile dysfunction.  If you are told, drink enough fluid to keep your urine pale yellow. This will help to flush the contrast dye  out of your body. This information is not intended to replace advice given to you by your health care provider. Make sure you discuss any questions you have with your health care provider. Document Revised: 09/11/2018 Document Reviewed: 09/11/2018 Elsevier Patient Education  [place].

## 2019-03-04 NOTE — Progress Notes (Signed)
Cardiology Office Note   Date:  03/04/2019   ID:  Brena Gavigan, [place] 22-Nov-1960, MRN [id number]  PCP:  Venita Lick, NP  Cardiologist:   Kathlyn Sacramento, MD   Chief Complaint  Patient presents with  . New Patient (Initial Visit)    Chest pain/fatigue/SOB with exertion; Meds verbally reviewed with patient.      History of Present Illness: Suzanne Scott is a 59 y.o. female who was referred by Marnee Guarneri for evaluation of chest pain.  She has no prior cardiac history.  She has known history of hyperlipidemia not on medication, anxiety and possible sleep apnea.  She had a recent sleep study done but the results are pending.  She reports intermittent episodes of chest pain that can start on the right side and occasionally on the left side.  It is described as sharp discomfort lasting for about 10 minutes both at rest and with exertion.  She feels extremely tired with activities with associated shortness of breath.  No orthopnea, PND or leg edema.  She does not exercise on a regular basis.  She does complain of palpitations that has been a chronic issue for her.  In the past, this improved with citalopram.  No syncope or presyncope.  She had recent labs done which showed an LDL of 172.  She is not a smoker and does not drink alcohol.  Family history is negative for premature coronary artery disease.  She works as a Quarry manager at [employer].    Past Medical History:  Diagnosis Date  . Anxiety   . Atrophic vaginitis   . Hyperlipidemia     Past Surgical History:  Procedure Laterality Date  . BREAST BIOPSY Left 04/08/2015   negative  . COLONOSCOPY WITH PROPOFOL N/A 10/15/2017   Procedure: COLONOSCOPY WITH PROPOFOL;  Surgeon: Lin Landsman, MD;  Location: [facility] ENDOSCOPY;  Service: Gastroenterology;  Laterality: N/A;  . ESOPHAGOGASTRODUODENOSCOPY (EGD) WITH PROPOFOL N/A 10/15/2017   Procedure: ESOPHAGOGASTRODUODENOSCOPY (EGD) WITH PROPOFOL;  Surgeon: Lin Landsman, MD;   Location: [facility] ENDOSCOPY;  Service: Gastroenterology;  Laterality: N/A;     Current Outpatient Medications  Medication Sig Dispense Refill  . Cholecalciferol (VITAMIN D3 PO) Take 2,000 Units by mouth daily.     . citalopram (CELEXA) 40 MG tablet Take 1 tablet (40 mg total) by mouth daily. 90 tablet 3  . omeprazole (PRILOSEC) 40 MG capsule TAKE 1 CAPSULE(40 MG) BY MOUTH DAILY BEFORE BREAKFAST (Patient taking differently: daily as needed. TAKE 1 CAPSULE(40 MG) BY MOUTH DAILY BEFORE BREAKFAST) 90 capsule 2  . prednisoLONE acetate (PRED FORTE) 1 % ophthalmic suspension INT 1 GTT INTO OU TID     No current facility-administered medications for this visit.    Allergies:   Patient has no known allergies.    Social History:  The patient  reports that she has never smoked. She has never used smokeless tobacco. She reports current alcohol use of about 1.0 standard drinks of alcohol per week. She reports that she does not use drugs.   Family History:  The patient's family history includes Arthritis in her mother; Heart disease in her mother.    ROS:  Please see the history of present illness.   Otherwise, review of systems are positive for none.   All other systems are reviewed and negative.    PHYSICAL EXAM: VS:  BP 130/74 (BP Location: Left Arm, Patient Position: Sitting, Cuff Size: Normal)   Pulse 64  Ht 5\' 9"  (1.753 m)   Wt 170 lb (77.1 kg)   LMP  (LMP Unknown)   SpO2 98%   BMI 25.10 kg/m  , BMI Body mass index is 25.1 kg/m. GEN: Well nourished, well developed, in no acute distress  HEENT: normal  Neck: no JVD, carotid bruits, or masses Cardiac: RRR; no murmurs, rubs, or gallops,no edema  Respiratory:  clear to auscultation bilaterally, normal work of breathing GI: soft, nontender, nondistended, + BS MS: no deformity or atrophy  Skin: warm and dry, no rash Neuro:  Strength and sensation are intact Psych: euthymic mood, full affect   EKG:  EKG is ordered today. The ekg  ordered today demonstrates normal sinus rhythm with no significant ST or T wave changes.   Recent Labs: 08/21/2018: TSH 0.589 02/17/2019: ALT 21; BUN 8; Creatinine, Ser 0.71; Hemoglobin 13.2; Platelets 302; Potassium 4.3; Sodium 139    Lipid Panel    Component Value Date/Time   CHOL 237 (H) 02/17/2019 0947   CHOL 228 (H) 11/30/2014 1315   TRIG 113 02/17/2019 0947   TRIG 122 11/30/2014 1315   HDL 45 02/17/2019 0947   VLDL 24 11/30/2014 1315   LDLCALC 172 (H) 02/17/2019 0947      Wt Readings from Last 3 Encounters:  03/04/19 170 lb (77.1 kg)  02/24/19 167 lb 4 oz (75.9 kg)  01/14/19 165 lb (74.8 kg)        PAD Screen 03/04/2019  Previous PAD dx? No  Previous surgical procedure? No  Pain with walking? Yes  Subsides with rest? No  Feet/toe relief with dangling? No  Painful, non-healing ulcers? No  Extremities discolored? No      ASSESSMENT AND PLAN:  1.  Atypical chest pain with intermediate risk of underlying coronary artery disease.  She does have significant hyperlipidemia with a recent LDL of 172.  Although her chest pain is atypical, she describes significant exertional dyspnea and fatigue which could be anginal equivalent.  I discussed different management options with her and recommend CTA of the coronary arteries with FFR.  2.  Palpitations: I suspect is likely sinus tachycardia in the setting of anxiety.  Her EKG is completely unremarkable.  I think the utility of outpatient monitoring is going to be low.  3.  Hyperlipidemia: Recent LDL was 172.  CTA of the coronary arteries will be very helpful in risk stratifying her and see if there is need for treatment with medications.    Disposition:   FU with me as needed  Signed,  Kathlyn Sacramento, MD  03/04/2019 3:32 PM    [place]

## 2019-03-26 ENCOUNTER — Ambulatory Visit: Payer: Commercial Managed Care - PPO | Admitting: Nurse Practitioner

## 2019-03-26 ENCOUNTER — Telehealth (INDEPENDENT_AMBULATORY_CARE_PROVIDER_SITE_OTHER): Payer: Commercial Managed Care - PPO | Admitting: Nurse Practitioner

## 2019-03-26 ENCOUNTER — Encounter: Payer: Self-pay | Admitting: Nurse Practitioner

## 2019-03-26 ENCOUNTER — Encounter (HOSPITAL_COMMUNITY): Payer: Self-pay

## 2019-03-26 ENCOUNTER — Telehealth (HOSPITAL_COMMUNITY): Payer: Self-pay | Admitting: Emergency Medicine

## 2019-03-26 VITALS — BP 120/67 | HR 72 | Temp 97.6°F | Wt 167.0 lb

## 2019-03-26 DIAGNOSIS — E782 Mixed hyperlipidemia: Secondary | ICD-10-CM

## 2019-03-26 DIAGNOSIS — R7303 Prediabetes: Secondary | ICD-10-CM

## 2019-03-26 DIAGNOSIS — R030 Elevated blood-pressure reading, without diagnosis of hypertension: Secondary | ICD-10-CM | POA: Diagnosis not present

## 2019-03-26 DIAGNOSIS — R5383 Other fatigue: Secondary | ICD-10-CM

## 2019-03-26 NOTE — Patient Instructions (Signed)
DASH Eating Plan DASH stands for "Dietary Approaches to Stop Hypertension." The DASH eating plan is a healthy eating plan that has been shown to reduce high blood pressure (hypertension). It may also reduce your risk for type 2 diabetes, heart disease, and stroke. The DASH eating plan may also help with weight loss. What are tips for following this plan?  General guidelines  Avoid eating more than 2,300 mg (milligrams) of salt (sodium) a day. If you have hypertension, you may need to reduce your sodium intake to 1,500 mg a day.  Limit alcohol intake to no more than 1 drink a day for nonpregnant women and 2 drinks a day for men. One drink equals 12 oz of beer, 5 oz of wine, or 1 oz of hard liquor.  Work with your health care provider to maintain a healthy body weight or to lose weight. Ask what an ideal weight is for you.  Get at least 30 minutes of exercise that causes your heart to beat faster (aerobic exercise) most days of the week. Activities may include walking, swimming, or biking.  Work with your health care provider or diet and nutrition specialist (dietitian) to adjust your eating plan to your individual calorie needs. Reading food labels   Check food labels for the amount of sodium per serving. Choose foods with less than 5 percent of the Daily Value of sodium. Generally, foods with less than 300 mg of sodium per serving fit into this eating plan.  To find whole grains, look for the word "whole" as the first word in the ingredient list. Shopping  Buy products labeled as "low-sodium" or "no salt added."  Buy fresh foods. Avoid canned foods and premade or frozen meals. Cooking  Avoid adding salt when cooking. Use salt-free seasonings or herbs instead of table salt or sea salt. Check with your health care provider or pharmacist before using salt substitutes.  Do not fry foods. Cook foods using healthy methods such as baking, boiling, grilling, and broiling instead.  Cook with  heart-healthy oils, such as olive, canola, soybean, or sunflower oil. Meal planning  Eat a balanced diet that includes: ? 5 or more servings of fruits and vegetables each day. At each meal, try to fill half of your plate with fruits and vegetables. ? Up to 6-8 servings of whole grains each day. ? Less than 6 oz of lean meat, poultry, or fish each day. A 3-oz serving of meat is about the same size as a deck of cards. One egg equals 1 oz. ? 2 servings of low-fat dairy each day. ? A serving of nuts, seeds, or beans 5 times each week. ? Heart-healthy fats. Healthy fats called Omega-3 fatty acids are found in foods such as flaxseeds and coldwater fish, like sardines, salmon, and mackerel.  Limit how much you eat of the following: ? Canned or prepackaged foods. ? Food that is high in trans fat, such as fried foods. ? Food that is high in saturated fat, such as fatty meat. ? Sweets, desserts, sugary drinks, and other foods with added sugar. ? Full-fat dairy products.  Do not salt foods before eating.  Try to eat at least 2 vegetarian meals each week.  Eat more home-cooked food and less restaurant, buffet, and fast food.  When eating at a restaurant, ask that your food be prepared with less salt or no salt, if possible. What foods are recommended? The items listed may not be a complete list. Talk with your dietitian about   what dietary choices are best for you. Grains Whole-grain or whole-wheat bread. Whole-grain or whole-wheat pasta. Brown rice. Oatmeal. Quinoa. Bulgur. Whole-grain and low-sodium cereals. Pita bread. Low-fat, low-sodium crackers. Whole-wheat flour tortillas. Vegetables Fresh or frozen vegetables (raw, steamed, roasted, or grilled). Low-sodium or reduced-sodium tomato and vegetable juice. Low-sodium or reduced-sodium tomato sauce and tomato paste. Low-sodium or reduced-sodium canned vegetables. Fruits All fresh, dried, or frozen fruit. Canned fruit in natural juice (without  added sugar). Meat and other protein foods Skinless chicken or turkey. Ground chicken or turkey. Pork with fat trimmed off. Fish and seafood. Egg whites. Dried beans, peas, or lentils. Unsalted nuts, nut butters, and seeds. Unsalted canned beans. Lean cuts of beef with fat trimmed off. Low-sodium, lean deli meat. Dairy Low-fat (1%) or fat-free (skim) milk. Fat-free, low-fat, or reduced-fat cheeses. Nonfat, low-sodium ricotta or cottage cheese. Low-fat or nonfat yogurt. Low-fat, low-sodium cheese. Fats and oils Soft margarine without trans fats. Vegetable oil. Low-fat, reduced-fat, or light mayonnaise and salad dressings (reduced-sodium). Canola, safflower, olive, soybean, and sunflower oils. Avocado. Seasoning and other foods Herbs. Spices. Seasoning mixes without salt. Unsalted popcorn and pretzels. Fat-free sweets. What foods are not recommended? The items listed may not be a complete list. Talk with your dietitian about what dietary choices are best for you. Grains Baked goods made with fat, such as croissants, muffins, or some breads. Dry pasta or rice meal packs. Vegetables Creamed or fried vegetables. Vegetables in a cheese sauce. Regular canned vegetables (not low-sodium or reduced-sodium). Regular canned tomato sauce and paste (not low-sodium or reduced-sodium). Regular tomato and vegetable juice (not low-sodium or reduced-sodium). Pickles. Olives. Fruits Canned fruit in a light or heavy syrup. Fried fruit. Fruit in cream or butter sauce. Meat and other protein foods Fatty cuts of meat. Ribs. Fried meat. Bacon. Sausage. Bologna and other processed lunch meats. Salami. Fatback. Hotdogs. Bratwurst. Salted nuts and seeds. Canned beans with added salt. Canned or smoked fish. Whole eggs or egg yolks. Chicken or turkey with skin. Dairy Whole or 2% milk, cream, and half-and-half. Whole or full-fat cream cheese. Whole-fat or sweetened yogurt. Full-fat cheese. Nondairy creamers. Whipped toppings.  Processed cheese and cheese spreads. Fats and oils Butter. Stick margarine. Lard. Shortening. Ghee. Bacon fat. Tropical oils, such as coconut, palm kernel, or palm oil. Seasoning and other foods Salted popcorn and pretzels. Onion salt, garlic salt, seasoned salt, table salt, and sea salt. Worcestershire sauce. Tartar sauce. Barbecue sauce. Teriyaki sauce. Soy sauce, including reduced-sodium. Steak sauce. Canned and packaged gravies. Fish sauce. Oyster sauce. Cocktail sauce. Horseradish that you find on the shelf. Ketchup. Mustard. Meat flavorings and tenderizers. Bouillon cubes. Hot sauce and Tabasco sauce. Premade or packaged marinades. Premade or packaged taco seasonings. Relishes. Regular salad dressings. Where to find more information:  National Heart, Lung, and Blood Institute: www.nhlbi.nih.gov  American Heart Association: www.heart.org Summary  The DASH eating plan is a healthy eating plan that has been shown to reduce high blood pressure (hypertension). It may also reduce your risk for type 2 diabetes, heart disease, and stroke.  With the DASH eating plan, you should limit salt (sodium) intake to 2,300 mg a day. If you have hypertension, you may need to reduce your sodium intake to 1,500 mg a day.  When on the DASH eating plan, aim to eat more fresh fruits and vegetables, whole grains, lean proteins, low-fat dairy, and heart-healthy fats.  Work with your health care provider or diet and nutrition specialist (dietitian) to adjust your eating plan to your   individual calorie needs. This information is not intended to replace advice given to you by your health care provider. Make sure you discuss any questions you have with your health care provider. Document Revised: 12/29/2016 Document Reviewed: 01/10/2016 Elsevier Patient Education  2020 [employer]  

## 2019-03-26 NOTE — Progress Notes (Signed)
BP 120/67   Pulse 72   Temp 97.6 F (36.4 C)   Wt 167 lb (75.8 kg)   LMP  (LMP Unknown)   SpO2 99%   BMI 24.66 kg/m    Subjective:    Patient ID: Suzanne Scott, female    DOB: 11-Jan-1961, 59 y.o.   MRN: [id number]  HPI: Suzanne Scott is a 59 y.o. female  Chief Complaint  Patient presents with  . Hyperlipidemia  . Diabetes  . Fatigue    . This visit was completed via telephone due to the restrictions of the COVID-19 pandemic. All issues as above were discussed and addressed but no physical exam was performed. If it was felt that the patient should be evaluated in the office, they were directed there. The patient verbally consented to this visit. Patient was unable to complete an audio/visual visit due to Technical difficulties,Lack of internet. Due to the catastrophic nature of the COVID-19 pandemic, this visit was done through audio contact only.  Her MyChart would not connect. . Location of the patient: home . Location of the provider: work . Those involved with this call:  . Provider: Marnee Guarneri, DNP . CMA: Yvonna Alanis, CMA . Front Desk/Registration: Don Perking  . Time spent on call: 15 minutes on the phone discussing health concerns. 10 minutes total spent in review of patient's record and preparation of their chart.  . I verified patient identity using two factors (patient name and date of birth). Patient consents verbally to being seen via telemedicine visit today.    HTN/HYPERLIPIDEMIA + FATIGUE Seen on 02/17/2019 for similar, follow-up today. No current BP or HLD medications.  Recent lipid panel, non fasting, with elevations noted  Her last visit with cardiology was 2/2/201 and no changes made, it is suspected recent tachycardia was related to anxiety.  She is scheduled for CTA of coronary arteries. Reports that her fatigue has improved and only occasional periods of CP, has improved.  Increased Celexa to 40 MG at recent visit and anxiety  has improved, but not 100%, however she does not want to add any additional medications or change medication at this time.   Satisfied with current treatment?yes Duration of hypertension:chronic BP monitoring frequency:rarely BP range:130/67 this morning Cholesterol supplements: none Aspirin:no Recent stressors:no Recurrent headaches:none Visual changes:seeing eye doctor for this Palpitations:occasional, but improving Dyspnea:yes Chest pain:yes Lower extremity edema:no Dizzy/lightheaded:no The 10-year ASCVD risk score [name], et al., 2013) is: 7.4%   Values used to calculate the score:     Age: 89 years     Sex: Female     Is Non-Hispanic African American: Yes     Diabetic: No     Tobacco smoker: No     Systolic Blood Pressure: 103 mmHg     Is BP treated: Yes     HDL Cholesterol: 45 mg/dL     Total Cholesterol: 237 mg/dL  PREDIABETES: Last A1C 5.9% on 01/20/2019.  She is focusing on diet. Polydipsia/polyuria: no Visual disturbance: no Chest pain: no Paresthesias: no  Relevant past medical, surgical, family and social history reviewed and updated as indicated. Interim medical history since our last visit reviewed. Allergies and medications reviewed and updated.  Review of Systems  Constitutional: Positive for fatigue (improving). Negative for activity change, appetite change, diaphoresis and fever.  Respiratory: Negative for cough, chest tightness, shortness of breath and wheezing.   Cardiovascular: Positive for palpitations (improving). Negative for chest pain and leg swelling.  Gastrointestinal:  Negative.   Endocrine: Negative.   Neurological: Negative.   Psychiatric/Behavioral: Negative for decreased concentration, self-injury, sleep disturbance and suicidal ideas. The patient is not nervous/anxious.     Per HPI unless specifically indicated above     Objective:    BP 120/67   Pulse 72   Temp 97.6 F (36.4 C)   Wt 167 lb (75.8 kg)   LMP   (LMP Unknown)   SpO2 99%   BMI 24.66 kg/m   Wt Readings from Last 3 Encounters:  03/26/19 167 lb (75.8 kg)  03/04/19 170 lb (77.1 kg)  02/24/19 167 lb 4 oz (75.9 kg)    Physical Exam   Unable to perform due to telephone visit only.  Results for orders placed or performed in visit on 02/17/19  C-reactive protein  Result Value Ref Range   CRP 4 0 - 10 mg/L  Sed Rate (ESR)  Result Value Ref Range   Sed Rate 24 0 - 40 mm/hr  CBC with Differential/Platelet out  Result Value Ref Range   WBC 3.6 3.4 - 10.8 x10E3/uL   RBC 4.78 3.77 - 5.28 x10E6/uL   Hemoglobin 13.2 11.1 - 15.9 g/dL   Hematocrit 40.8 34.0 - 46.6 %   MCV 85 79 - 97 fL   MCH 27.6 26.6 - 33.0 pg   MCHC 32.4 31.5 - 35.7 g/dL   RDW 14.4 11.7 - 15.4 %   Platelets 302 150 - 450 x10E3/uL   Neutrophils 52 Not Estab. %   Lymphs 37 Not Estab. %   Monocytes 8 Not Estab. %   Eos 2 Not Estab. %   Basos 1 Not Estab. %   Neutrophils Absolute 1.9 1.4 - 7.0 x10E3/uL   Lymphocytes Absolute 1.3 0.7 - 3.1 x10E3/uL   Monocytes Absolute 0.3 0.1 - 0.9 x10E3/uL   EOS (ABSOLUTE) 0.1 0.0 - 0.4 x10E3/uL   Basophils Absolute 0.0 0.0 - 0.2 x10E3/uL   Immature Granulocytes 0 Not Estab. %   Immature Grans (Abs) 0.0 0.0 - 0.1 x10E3/uL  Comprehensive metabolic panel  Result Value Ref Range   Glucose 91 65 - 99 mg/dL   BUN 8 8 - 27 mg/dL   Creatinine, Ser 0.71 0.57 - 1.00 mg/dL   GFR calc non Af Amer 93 >59 mL/min/1.73   GFR calc Af Amer 107 >59 mL/min/1.73   BUN/Creatinine Ratio 11 (L) 12 - 28   Sodium 139 134 - 144 mmol/L   Potassium 4.3 3.5 - 5.2 mmol/L   Chloride 102 96 - 106 mmol/L   CO2 27 20 - 29 mmol/L   Calcium 9.0 8.7 - 10.3 mg/dL   Total Protein 7.0 6.0 - 8.5 g/dL   Albumin 4.1 3.8 - 4.9 g/dL   Globulin, Total 2.9 1.5 - 4.5 g/dL   Albumin/Globulin Ratio 1.4 1.2 - 2.2   Bilirubin Total 0.3 0.0 - 1.2 mg/dL   Alkaline Phosphatase 69 39 - 117 IU/L   AST 18 0 - 40 IU/L   ALT 21 0 - 32 IU/L  Lipid Panel w/o Chol/HDL Ratio  out  Result Value Ref Range   Cholesterol, Total 237 (H) 100 - 199 mg/dL   Triglycerides 113 0 - 149 mg/dL   HDL 45 >39 mg/dL   VLDL Cholesterol Cal 20 5 - 40 mg/dL   LDL Chol Calc (NIH) 172 (H) 0 - 99 mg/dL      Assessment & Plan:   Problem List Items Addressed This Visit      Other  Hyperlipidemia    Ongoing with no current statin therapy.  Check lipid panel and CMP next visit, consider starting statin as preventative.  Continue collaboration with cardiology.      Prediabetes    Continue diet and exercise focus, recheck A1C in 4 weeks.      Fatigue    Chronic, ongoing.  Recent labs WNL.  Does endorse snoring and no previous sleep study.  Referral for this last visit and is to schedule, [facility].        Elevated BP without diagnosis of hypertension - Primary    Ongoing with home BP below goal on recent check.  No current medications.  Continue focus on DASH diet and regular exercise.  Continue to monitor BP at home and collaboration with cardiology.  Initiate medication as needed.  CMP next visit.          Follow up plan: Return in about 4 weeks (around 04/23/2019) for HTN/HLD, Prediabetes, Fatigue.

## 2019-03-26 NOTE — Assessment & Plan Note (Signed)
Ongoing with no current statin therapy.  Check lipid panel and CMP next visit, consider starting statin as preventative.  Continue collaboration with cardiology.

## 2019-03-26 NOTE — Assessment & Plan Note (Signed)
Ongoing with home BP below goal on recent check.  No current medications.  Continue focus on DASH diet and regular exercise.  Continue to monitor BP at home and collaboration with cardiology.  Initiate medication as needed.  CMP next visit.

## 2019-03-26 NOTE — Assessment & Plan Note (Signed)
Continue diet and exercise focus, recheck A1C in 4 weeks.

## 2019-03-26 NOTE — Telephone Encounter (Signed)
Left message on voicemail with name and callback number Evonna Stoltz RN Navigator Cardiac [facility] [phone] Office [phone] Cell  

## 2019-03-26 NOTE — Assessment & Plan Note (Signed)
Chronic, ongoing.  Recent labs WNL.  Does endorse snoring and no previous sleep study.  Referral for this last visit and is to schedule, Feeling Great.

## 2019-03-27 ENCOUNTER — Ambulatory Visit
Admission: RE | Admit: 2019-03-27 | Discharge: 2019-03-27 | Disposition: A | Discharge status: Home / Self Care | Payer: Commercial Managed Care - PPO | Source: Ambulatory Visit | Attending: Cardiovascular Disease | Admitting: Cardiovascular Disease

## 2019-03-27 ENCOUNTER — Other Ambulatory Visit: Payer: Self-pay

## 2019-03-27 DIAGNOSIS — R072 Precordial pain: Secondary | ICD-10-CM | POA: Insufficient documentation

## 2019-03-27 MED ORDER — IOHEXOL 350 MG/ML SOLN
75.0000 mL | Freq: Once | INTRAVENOUS | Status: AC | PRN
  Administered 2019-03-27: 13:00:00 75 mL via INTRAVENOUS

## 2019-03-27 MED ORDER — NITROGLYCERIN 0.4 MG SL SUBL
0.8000 mg | SUBLINGUAL_TABLET | Freq: Once | SUBLINGUAL | Status: AC
  Administered 2019-03-27: 13:00:00 0.8 mg via SUBLINGUAL

## 2019-04-25 ENCOUNTER — Ambulatory Visit: Payer: Commercial Managed Care - PPO | Admitting: Nurse Practitioner

## 2019-04-28 ENCOUNTER — Ambulatory Visit (INDEPENDENT_AMBULATORY_CARE_PROVIDER_SITE_OTHER): Payer: Commercial Managed Care - PPO | Admitting: Nurse Practitioner

## 2019-04-28 ENCOUNTER — Other Ambulatory Visit: Payer: Self-pay

## 2019-04-28 ENCOUNTER — Encounter: Payer: Self-pay | Admitting: Nurse Practitioner

## 2019-04-28 VITALS — BP 130/75 | HR 63 | Temp 98.5°F | Ht 68.7 in | Wt 166.2 lb

## 2019-04-28 DIAGNOSIS — R7303 Prediabetes: Secondary | ICD-10-CM | POA: Diagnosis not present

## 2019-04-28 DIAGNOSIS — R030 Elevated blood-pressure reading, without diagnosis of hypertension: Secondary | ICD-10-CM | POA: Diagnosis not present

## 2019-04-28 DIAGNOSIS — R5383 Other fatigue: Secondary | ICD-10-CM

## 2019-04-28 DIAGNOSIS — E782 Mixed hyperlipidemia: Secondary | ICD-10-CM | POA: Diagnosis not present

## 2019-04-28 DIAGNOSIS — L299 Pruritus, unspecified: Secondary | ICD-10-CM

## 2019-04-28 MED ORDER — HYDROXYZINE HCL 10 MG PO TABS: 10.0000 mg | ORAL_TABLET | Freq: Three times a day (TID) | ORAL | 0 refills | Status: DC | PRN

## 2019-04-28 NOTE — Assessment & Plan Note (Signed)
To back with overheating, no rashes noted today on exam.  Will trial Vistaril as needed, which may also benefit sleep.  She notices this more at night.  Return for worsening or ongoing issues.

## 2019-04-28 NOTE — Progress Notes (Signed)
BP 130/75 (BP Location: Left Arm, Patient Position: Sitting, Cuff Size: Normal)   Pulse 63   Temp 98.5 F (36.9 C) (Oral)   Ht 5' 8.7" (1.745 m)   Wt 166 lb 3.2 oz (75.4 kg)   LMP  (LMP Unknown)   SpO2 100%   BMI 24.76 kg/m    Subjective:    Patient ID: Suzanne Scott, female    DOB: 02-11-1960, 59 y.o.   MRN: [id number]  HPI: Suzanne Scott is a 59 y.o. female  Chief Complaint  Patient presents with  . Hypertension  . Hyperlipidemia  . Prediabetes  . Fatigue  . back itching   HYPERTENSION / HYPERLIPIDEMIA No current medications for either.  Have discussed with her the benefit of adding on stating if LDL remains elevated, last LDL 172. Satisfied with current treatment? yes Duration of hypertension: chronic BP monitoring frequency: not checking BP range:  BP medication side effects: no Duration of hyperlipidemia: chronic Cholesterol medication side effects: no Cholesterol supplements: none Medication compliance: good compliance Aspirin: no Recent stressors: no Recurrent headaches: no Visual changes: no Palpitations: no Dyspnea: no Chest pain: no Lower extremity edema: no Dizzy/lightheaded: no  The 10-year ASCVD risk score Suzanne Scott., et al., 2013) is: 7.1%   Values used to calculate the score:     Age: 59 years     Sex: Female     Is Non-Hispanic African American: Yes     Diabetic: No     Tobacco smoker: No     Systolic Blood Pressure: 350 mmHg     Is BP treated: No     HDL Cholesterol: 45 mg/dL     Total Cholesterol: 237 mg/dL   PREDIABETES Last A1C in December 5.9% Polydipsia/polyuria: no Visual disturbance: no Chest pain: no Paresthesias: no   FATIGUE Her last visit with cardiology was 03/04/2019.  Reports that her fatigue has improved and only occasional periods of CP, has improved.  Increased Celexa to 40 MG and anxiety has improved.  Her sleep study showed no need for CPAP, but had multiple awakenings.  Has not tried Melatonin.   Duration:  chronic Severity: mild  Onset: gradual Context when symptoms started:  unknown Symptoms improve with rest: yes  Depressive symptoms: no Stress/anxiety: yes Insomnia: yes hard to fall asleep Snoring: no Observed apnea by bed partner: no Daytime hypersomnolence:no Wakes feeling refreshed: no History of sleep study: yes Dysnea on exertion:  no Orthopnea/PND: no Chest pain: no Chronic cough: no Lower extremity edema: no Arthralgias:no Myalgias: no Weakness: no Rash: no  RASH Did take Benadryl for this back itching.  This started 2-3 weeks ago.  She feels it is brought on when she gets too hot, notices it more then.  Benadryl has helped. Duration:  weeks  Location: back  Itching: yes Burning: no Redness: no Oozing: no Scaling: no Blisters: no Painful: no Fevers: no Change in detergents/soaps/personal care products: no Recent illness: no Recent travel:no History of same: no Context: stable Alleviating factors: benadryl Treatments attempted:benadryl Shortness of breath: no  Throat/tongue swelling: no Myalgias/arthralgias: no  Relevant past medical, surgical, family and social history reviewed and updated as indicated. Interim medical history since our last visit reviewed. Allergies and medications reviewed and updated.  Review of Systems  Constitutional: Positive for fatigue (improving). Negative for activity change, appetite change, diaphoresis and fever.  Respiratory: Negative for cough, chest tightness, shortness of breath and wheezing.   Cardiovascular: Negative for chest pain, palpitations and  leg swelling.  Gastrointestinal: Negative.   Endocrine: Negative.   Neurological: Negative.   Psychiatric/Behavioral: Negative for decreased concentration, self-injury, sleep disturbance and suicidal ideas. The patient is not nervous/anxious.     Per HPI unless specifically indicated above     Objective:    BP 130/75 (BP Location: Left Arm, Patient  Position: Sitting, Cuff Size: Normal)   Pulse 63   Temp 98.5 F (36.9 C) (Oral)   Ht 5' 8.7" (1.745 m)   Wt 166 lb 3.2 oz (75.4 kg)   LMP  (LMP Unknown)   SpO2 100%   BMI 24.76 kg/m   Wt Readings from Last 3 Encounters:  04/28/19 166 lb 3.2 oz (75.4 kg)  03/26/19 167 lb (75.8 kg)  03/04/19 170 lb (77.1 kg)    Physical Exam Vitals and nursing note reviewed.  Constitutional:      General: She is awake. She is not in acute distress.    Appearance: She is well-developed. She is not ill-appearing.  HENT:     Head: Normocephalic.     Right Ear: Hearing normal.     Left Ear: Hearing normal.  Eyes:     General: Lids are normal.        Right eye: No discharge.        Left eye: No discharge.     Conjunctiva/sclera: Conjunctivae normal.     Pupils: Pupils are equal, round, and reactive to light.  Neck:     Thyroid: No thyromegaly.     Vascular: No carotid bruit or JVD.  Cardiovascular:     Rate and Rhythm: Normal rate and regular rhythm.     Heart sounds: Normal heart sounds. No murmur. No gallop.   Pulmonary:     Effort: Pulmonary effort is normal. No accessory muscle usage or respiratory distress.     Breath sounds: Normal breath sounds.  Abdominal:     General: Bowel sounds are normal.     Palpations: Abdomen is soft.  Musculoskeletal:     Cervical back: Normal range of motion and neck supple.     Right lower leg: No edema.     Left lower leg: No edema.  Lymphadenopathy:     Cervical: No cervical adenopathy.  Skin:    General: Skin is warm and dry.     Findings: No rash.     Comments: On assessment of full back no rashes noted.  Neurological:     Mental Status: She is alert and oriented to person, place, and time.  Psychiatric:        Attention and Perception: Attention normal.        Mood and Affect: Mood normal.        Speech: Speech normal.        Behavior: Behavior normal. Behavior is cooperative.     Results for orders placed or performed in visit on  02/17/19  C-reactive protein  Result Value Ref Range   CRP 4 0 - 10 mg/L  Sed Rate (ESR)  Result Value Ref Range   Sed Rate 24 0 - 40 mm/hr  CBC with Differential/Platelet out  Result Value Ref Range   WBC 3.6 3.4 - 10.8 x10E3/uL   RBC 4.78 3.77 - 5.28 x10E6/uL   Hemoglobin 13.2 11.1 - 15.9 g/dL   Hematocrit 40.8 34.0 - 46.6 %   MCV 85 79 - 97 fL   MCH 27.6 26.6 - 33.0 pg   MCHC 32.4 31.5 - 35.7 g/dL   RDW 14.4  11.7 - 15.4 %   Platelets 302 150 - 450 x10E3/uL   Neutrophils 52 Not Estab. %   Lymphs 37 Not Estab. %   Monocytes 8 Not Estab. %   Eos 2 Not Estab. %   Basos 1 Not Estab. %   Neutrophils Absolute 1.9 1.4 - 7.0 x10E3/uL   Lymphocytes Absolute 1.3 0.7 - 3.1 x10E3/uL   Monocytes Absolute 0.3 0.1 - 0.9 x10E3/uL   EOS (ABSOLUTE) 0.1 0.0 - 0.4 x10E3/uL   Basophils Absolute 0.0 0.0 - 0.2 x10E3/uL   Immature Granulocytes 0 Not Estab. %   Immature Grans (Abs) 0.0 0.0 - 0.1 x10E3/uL  Comprehensive metabolic panel  Result Value Ref Range   Glucose 91 65 - 99 mg/dL   BUN 8 8 - 27 mg/dL   Creatinine, Ser 0.71 0.57 - 1.00 mg/dL   GFR calc non Af Amer 93 >59 mL/min/1.73   GFR calc Af Amer 107 >59 mL/min/1.73   BUN/Creatinine Ratio 11 (L) 12 - 28   Sodium 139 134 - 144 mmol/L   Potassium 4.3 3.5 - 5.2 mmol/L   Chloride 102 96 - 106 mmol/L   CO2 27 20 - 29 mmol/L   Calcium 9.0 8.7 - 10.3 mg/dL   Total Protein 7.0 6.0 - 8.5 g/dL   Albumin 4.1 3.8 - 4.9 g/dL   Globulin, Total 2.9 1.5 - 4.5 g/dL   Albumin/Globulin Ratio 1.4 1.2 - 2.2   Bilirubin Total 0.3 0.0 - 1.2 mg/dL   Alkaline Phosphatase 69 39 - 117 IU/L   AST 18 0 - 40 IU/L   ALT 21 0 - 32 IU/L  Lipid Panel w/o Chol/HDL Ratio out  Result Value Ref Range   Cholesterol, Total 237 (H) 100 - 199 mg/dL   Triglycerides 113 0 - 149 mg/dL   HDL 45 >39 mg/dL   VLDL Cholesterol Cal 20 5 - 40 mg/dL   LDL Chol Calc (NIH) 172 (H) 0 - 99 mg/dL      Assessment & Plan:   Problem List Items Addressed This Visit       Musculoskeletal and Integument   Pruritus    To back with overheating, no rashes noted today on exam.  Will trial Vistaril as needed, which may also benefit sleep.  She notices this more at night.  Return for worsening or ongoing issues.        Other   Hyperlipidemia    Ongoing with no current statin therapy.  Check lipid panel today, consider starting statin as preventative, have discussed at length with her.  Continue collaboration with cardiology.      Relevant Orders   Lipid Panel w/o Chol/HDL Ratio   Prediabetes - Primary    Continue diet and exercise focus, recheck A1C today.      Relevant Orders   HgB A1c   Fatigue    Chronic with some improvement.  Suspect this is related to poor sleep pattern, reviewed sleep study with her.  She had multiple awakenings.  Is seeing cardiology and no findings present.  Does report poor sleep and works multiple jobs. Recommend she try Melatonin 10 MG at night and determine if benefit.  If no benefit could trial Trazodone or Seroquel.        Elevated BP without diagnosis of hypertension    Ongoing with BP below goal today.  No current medications.  Continue focus on DASH diet and regular exercise.  Recommend she monitor BP at home daily and will continue collaboration  with cardiology.  Initiate medication as needed.  BMP today.      Relevant Orders   Basic metabolic panel       Follow up plan: Return in about 6 months (around 10/29/2019) for Annual physical.

## 2019-04-28 NOTE — Patient Instructions (Signed)

## 2019-04-28 NOTE — Assessment & Plan Note (Signed)
Ongoing with no current statin therapy.  Check lipid panel today, consider starting statin as preventative, have discussed at length with her.  Continue collaboration with cardiology.

## 2019-04-28 NOTE — Assessment & Plan Note (Signed)
Continue diet and exercise focus, recheck A1C today.

## 2019-04-28 NOTE — Assessment & Plan Note (Addendum)
Chronic with some improvement.  Suspect this is related to poor sleep pattern, reviewed sleep study with her.  She had multiple awakenings.  Is seeing cardiology and no findings present.  Does report poor sleep and works multiple jobs. Recommend she try Melatonin 10 MG at night and determine if benefit.  If no benefit could trial Trazodone or Seroquel.

## 2019-04-28 NOTE — Assessment & Plan Note (Addendum)
Ongoing with BP below goal today.  No current medications.  Continue focus on DASH diet and regular exercise.  Recommend she monitor BP at home daily and will continue collaboration with cardiology.  Initiate medication as needed.  BMP today.

## 2019-04-29 LAB — HEMOGLOBIN A1C
Est. average glucose Bld gHb Est-mCnc: 128 mg/dL
Hgb A1c MFr Bld: 6.1 % — ABNORMAL HIGH (ref 4.8–5.6)

## 2019-04-29 LAB — BASIC METABOLIC PANEL
BUN/Creatinine Ratio: 15 (ref 12–28)
BUN: 12 mg/dL (ref 8–27)
CO2: 26 mmol/L (ref 20–29)
Calcium: 9.1 mg/dL (ref 8.7–10.3)
Chloride: 102 mmol/L (ref 96–106)
Creatinine, Ser: 0.82 mg/dL (ref 0.57–1.00)
GFR calc Af Amer: 90 mL/min/{1.73_m2} (ref >59)
GFR calc non Af Amer: 78 mL/min/{1.73_m2} (ref >59)
Glucose: 91 mg/dL (ref 65–99)
Potassium: 4.4 mmol/L (ref 3.5–5.2)
Sodium: 140 mmol/L (ref 134–144)

## 2019-04-29 LAB — LIPID PANEL W/O CHOL/HDL RATIO
Cholesterol, Total: 214 mg/dL — ABNORMAL HIGH (ref 100–199)
HDL: 44 mg/dL (ref >39)
LDL Chol Calc (NIH): 151 mg/dL — ABNORMAL HIGH (ref 0–99)
Triglycerides: 106 mg/dL (ref 0–149)
VLDL Cholesterol Cal: 19 mg/dL (ref 5–40)

## 2019-04-29 NOTE — Progress Notes (Signed)
Contacted via MyChart The 10-year ASCVD risk score [name], et al., 2013) is: 6.5%   Values used to calculate the score:     Age: 59 years     Sex: Female     Is Non-Hispanic African American: Yes     Diabetic: No     Tobacco smoker: No     Systolic Blood Pressure: [id number]     Is BP treated: No     HDL Cholesterol: 44 mg/dL     Total Cholesterol: 214 mg/dL

## 2019-05-01 ENCOUNTER — Telehealth: Payer: Self-pay | Admitting: Nurse Practitioner

## 2019-05-01 ENCOUNTER — Other Ambulatory Visit: Payer: Self-pay | Admitting: Nurse Practitioner

## 2019-05-01 MED ORDER — ATORVASTATIN CALCIUM 10 MG PO TABS: 10.0000 mg | ORAL_TABLET | Freq: Every day | ORAL | 3 refills | Status: DC

## 2019-05-01 NOTE — Telephone Encounter (Signed)
Please alert patient I sent in Atorvastatin 10 MG for her to take every night before bed.  I want her to schedule follow-up for 6 weeks to assess tolerance and check labs.  Thank you.

## 2019-05-01 NOTE — Telephone Encounter (Signed)
Routing to provider  

## 2019-05-01 NOTE — Telephone Encounter (Signed)
I do not see a message in the encounter.

## 2019-05-01 NOTE — Telephone Encounter (Signed)
Copied from [place] [phone]. Topic: General - Inquiry >> May 01, 2019 10:21 AM Greggory Keen D wrote: Reason for CRM: Pt called saying she would like Jolene to call in the statin that they discussed earlier this week.  She uses [place] and [address]  CB#  [phone]

## 2019-05-01 NOTE — Telephone Encounter (Signed)
Called pt advised to take rx before bed nightly. Pt understood. Scheduled appt 06/13/19

## 2019-06-13 ENCOUNTER — Ambulatory Visit (INDEPENDENT_AMBULATORY_CARE_PROVIDER_SITE_OTHER): Payer: Commercial Managed Care - PPO | Admitting: Nurse Practitioner

## 2019-06-13 ENCOUNTER — Other Ambulatory Visit: Payer: Self-pay

## 2019-06-13 ENCOUNTER — Encounter: Payer: Self-pay | Admitting: Nurse Practitioner

## 2019-06-13 VITALS — BP 105/64 | HR 68 | Temp 98.0°F | Ht 69.0 in | Wt 168.4 lb

## 2019-06-13 DIAGNOSIS — E782 Mixed hyperlipidemia: Secondary | ICD-10-CM | POA: Diagnosis not present

## 2019-06-13 DIAGNOSIS — R7303 Prediabetes: Secondary | ICD-10-CM

## 2019-06-13 LAB — BAYER DCA HB A1C WAIVED: HB A1C (BAYER DCA - WAIVED): 5.8 % (ref <7.0)

## 2019-06-13 NOTE — Progress Notes (Signed)
BP 105/64 (BP Location: Left Arm, Patient Position: Sitting, Cuff Size: Normal)   Pulse 68   Temp 98 F (36.7 C) (Oral)   Ht 5\' 9"  (1.753 m)   Wt 168 lb 6.4 oz (76.4 kg)   LMP  (LMP Unknown)   SpO2 96%   BMI 24.87 kg/m    Subjective:    Patient ID: Suzanne Scott, female    DOB: January 19, 1961, 59 y.o.   MRN: [id number]  HPI: Suzanne Scott is a 59 y.o. female  Chief Complaint  Patient presents with  . Prediabetes  . Hyperlipidemia   HYPERLIPIDEMIA Last LDL 151 in March. Was started on Atorvastatin at last visit and no ADR.  Saw cardiology, Dr. Fletcher Anon, 03/04/19 and is to follow-up as needed.  Had sleep study in January 2021 -- no OSA to sleep at 30-45 degrees.  Satisfied with current treatment? yes Duration of hyperlipidemia: chronic Cholesterol medication side effects: no Cholesterol supplements: none Medication compliance: good compliance Aspirin: no Recent stressors: no Recurrent headaches: no Visual changes: no Palpitations: no Dyspnea: no Chest pain: no Lower extremity edema: no Dizzy/lightheaded: no  The 10-year ASCVD risk score [name], et al., 2013) is: 3.6%   Values used to calculate the score:     Age: 51 years     Sex: Female     Is Non-Hispanic African American: Yes     Diabetic: No     Tobacco smoker: No     Systolic Blood Pressure: 123456 mmHg     Is BP treated: No     HDL Cholesterol: 44 mg/dL     Total Cholesterol: 214 mg/dL  PREDIABETES A1C in March 6.1%, today 5.8%.  Polydipsia/polyuria: no Visual disturbance: no Chest pain: no Paresthesias: no  Relevant past medical, surgical, family and social history reviewed and updated as indicated. Interim medical history since our last visit reviewed. Allergies and medications reviewed and updated.  Review of Systems  Constitutional: Negative for activity change, appetite change, diaphoresis, fatigue and fever.  Respiratory: Negative for cough, chest tightness, shortness of breath and  wheezing.   Cardiovascular: Negative for chest pain, palpitations and leg swelling.  Gastrointestinal: Negative.   Endocrine: Negative.   Neurological: Negative.   Psychiatric/Behavioral: Negative for decreased concentration, self-injury, sleep disturbance and suicidal ideas. The patient is not nervous/anxious.     Per HPI unless specifically indicated above     Objective:    BP 105/64 (BP Location: Left Arm, Patient Position: Sitting, Cuff Size: Normal)   Pulse 68   Temp 98 F (36.7 C) (Oral)   Ht 5\' 9"  (1.753 m)   Wt 168 lb 6.4 oz (76.4 kg)   LMP  (LMP Unknown)   SpO2 96%   BMI 24.87 kg/m   Wt Readings from Last 3 Encounters:  06/13/19 168 lb 6.4 oz (76.4 kg)  04/28/19 166 lb 3.2 oz (75.4 kg)  03/26/19 167 lb (75.8 kg)    Physical Exam Vitals and nursing note reviewed.  Constitutional:      General: She is awake. She is not in acute distress.    Appearance: She is well-developed. She is not ill-appearing.  HENT:     Head: Normocephalic.     Right Ear: Hearing normal.     Left Ear: Hearing normal.  Eyes:     General: Lids are normal.        Right eye: No discharge.        Left eye: No discharge.  Conjunctiva/sclera: Conjunctivae normal.     Pupils: Pupils are equal, round, and reactive to light.  Neck:     Thyroid: No thyromegaly.     Vascular: No carotid bruit or JVD.  Cardiovascular:     Rate and Rhythm: Normal rate and regular rhythm.     Heart sounds: Normal heart sounds. No murmur. No gallop.   Pulmonary:     Effort: Pulmonary effort is normal. No accessory muscle usage or respiratory distress.     Breath sounds: Normal breath sounds.  Abdominal:     General: Bowel sounds are normal.     Palpations: Abdomen is soft.  Musculoskeletal:     Cervical back: Normal range of motion and neck supple.     Right lower leg: No edema.     Left lower leg: No edema.  Lymphadenopathy:     Cervical: No cervical adenopathy.  Skin:    General: Skin is warm and dry.   Neurological:     Mental Status: She is alert and oriented to person, place, and time.  Psychiatric:        Attention and Perception: Attention normal.        Mood and Affect: Mood normal.        Speech: Speech normal.        Behavior: Behavior normal. Behavior is cooperative.    Results for orders placed or performed in visit on 04/28/19  Lipid Panel w/o Chol/HDL Ratio  Result Value Ref Range   Cholesterol, Total 214 (H) 100 - 199 mg/dL   Triglycerides 106 0 - 149 mg/dL   HDL 44 >39 mg/dL   VLDL Cholesterol Cal 19 5 - 40 mg/dL   LDL Chol Calc (NIH) 151 (H) 0 - 99 mg/dL  Basic metabolic panel  Result Value Ref Range   Glucose 91 65 - 99 mg/dL   BUN 12 8 - 27 mg/dL   Creatinine, Ser 0.82 0.57 - 1.00 mg/dL   GFR calc non Af Amer 78 >59 mL/min/1.73   GFR calc Af Amer 90 >59 mL/min/1.73   BUN/Creatinine Ratio 15 12 - 28   Sodium 140 134 - 144 mmol/L   Potassium 4.4 3.5 - 5.2 mmol/L   Chloride 102 96 - 106 mmol/L   CO2 26 20 - 29 mmol/L   Calcium 9.1 8.7 - 10.3 mg/dL  HgB A1c  Result Value Ref Range   Hgb A1c MFr Bld 6.1 (H) 4.8 - 5.6 %   Est. average glucose Bld gHb Est-mCnc 128 mg/dL      Assessment & Plan:   Problem List Items Addressed This Visit      Other   Hyperlipidemia - Primary    Chronic, ongoing.  Will continue Atorvastatin 10 MG at this time and adjust dose as needed.  Lipid panel today.      Relevant Orders   Lipid panel   Prediabetes    A1C downward trend today with focus on diet and exercise, A1C 5.8%, continue focus on diet and exercise regimen.  BMP today.      Relevant Orders   Basic metabolic panel   Bayer DCA Hb A1c Waived       Follow up plan: Return in about 6 months (around 12/14/2019) for Annual physical.

## 2019-06-13 NOTE — Patient Instructions (Signed)
DASH Eating Plan DASH stands for "Dietary Approaches to Stop Hypertension." The DASH eating plan is a healthy eating plan that has been shown to reduce high blood pressure (hypertension). It may also reduce your risk for type 2 diabetes, heart disease, and stroke. The DASH eating plan may also help with weight loss. What are tips for following this plan?  General guidelines  Avoid eating more than 2,300 mg (milligrams) of salt (sodium) a day. If you have hypertension, you may need to reduce your sodium intake to 1,500 mg a day.  Limit alcohol intake to no more than 1 drink a day for nonpregnant women and 2 drinks a day for men. One drink equals 12 oz of beer, 5 oz of wine, or 1 oz of hard liquor.  Work with your health care provider to maintain a healthy body weight or to lose weight. Ask what an ideal weight is for you.  Get at least 30 minutes of exercise that causes your heart to beat faster (aerobic exercise) most days of the week. Activities may include walking, swimming, or biking.  Work with your health care provider or diet and nutrition specialist (dietitian) to adjust your eating plan to your individual calorie needs. Reading food labels   Check food labels for the amount of sodium per serving. Choose foods with less than 5 percent of the Daily Value of sodium. Generally, foods with less than 300 mg of sodium per serving fit into this eating plan.  To find whole grains, look for the word "whole" as the first word in the ingredient list. Shopping  Buy products labeled as "low-sodium" or "no salt added."  Buy fresh foods. Avoid canned foods and premade or frozen meals. Cooking  Avoid adding salt when cooking. Use salt-free seasonings or herbs instead of table salt or sea salt. Check with your health care provider or pharmacist before using salt substitutes.  Do not fry foods. Cook foods using healthy methods such as baking, boiling, grilling, and broiling instead.  Cook with  heart-healthy oils, such as olive, canola, soybean, or sunflower oil. Meal planning  Eat a balanced diet that includes: ? 5 or more servings of fruits and vegetables each day. At each meal, try to fill half of your plate with fruits and vegetables. ? Up to 6-8 servings of whole grains each day. ? Less than 6 oz of lean meat, poultry, or fish each day. A 3-oz serving of meat is about the same size as a deck of cards. One egg equals 1 oz. ? 2 servings of low-fat dairy each day. ? A serving of nuts, seeds, or beans 5 times each week. ? Heart-healthy fats. Healthy fats called Omega-3 fatty acids are found in foods such as flaxseeds and coldwater fish, like sardines, salmon, and mackerel.  Limit how much you eat of the following: ? Canned or prepackaged foods. ? Food that is high in trans fat, such as fried foods. ? Food that is high in saturated fat, such as fatty meat. ? Sweets, desserts, sugary drinks, and other foods with added sugar. ? Full-fat dairy products.  Do not salt foods before eating.  Try to eat at least 2 vegetarian meals each week.  Eat more home-cooked food and less restaurant, buffet, and fast food.  When eating at a restaurant, ask that your food be prepared with less salt or no salt, if possible. What foods are recommended? The items listed may not be a complete list. Talk with your dietitian about   what dietary choices are best for you. Grains Whole-grain or whole-wheat bread. Whole-grain or whole-wheat pasta. Brown rice. Oatmeal. Quinoa. Bulgur. Whole-grain and low-sodium cereals. Pita bread. Low-fat, low-sodium crackers. Whole-wheat flour tortillas. Vegetables Fresh or frozen vegetables (raw, steamed, roasted, or grilled). Low-sodium or reduced-sodium tomato and vegetable juice. Low-sodium or reduced-sodium tomato sauce and tomato paste. Low-sodium or reduced-sodium canned vegetables. Fruits All fresh, dried, or frozen fruit. Canned fruit in natural juice (without  added sugar). Meat and other protein foods Skinless chicken or turkey. Ground chicken or turkey. Pork with fat trimmed off. Fish and seafood. Egg whites. Dried beans, peas, or lentils. Unsalted nuts, nut butters, and seeds. Unsalted canned beans. Lean cuts of beef with fat trimmed off. Low-sodium, lean deli meat. Dairy Low-fat (1%) or fat-free (skim) milk. Fat-free, low-fat, or reduced-fat cheeses. Nonfat, low-sodium ricotta or cottage cheese. Low-fat or nonfat yogurt. Low-fat, low-sodium cheese. Fats and oils Soft margarine without trans fats. Vegetable oil. Low-fat, reduced-fat, or light mayonnaise and salad dressings (reduced-sodium). Canola, safflower, olive, soybean, and sunflower oils. Avocado. Seasoning and other foods Herbs. Spices. Seasoning mixes without salt. Unsalted popcorn and pretzels. Fat-free sweets. What foods are not recommended? The items listed may not be a complete list. Talk with your dietitian about what dietary choices are best for you. Grains Baked goods made with fat, such as croissants, muffins, or some breads. Dry pasta or rice meal packs. Vegetables Creamed or fried vegetables. Vegetables in a cheese sauce. Regular canned vegetables (not low-sodium or reduced-sodium). Regular canned tomato sauce and paste (not low-sodium or reduced-sodium). Regular tomato and vegetable juice (not low-sodium or reduced-sodium). Pickles. Olives. Fruits Canned fruit in a light or heavy syrup. Fried fruit. Fruit in cream or butter sauce. Meat and other protein foods Fatty cuts of meat. Ribs. Fried meat. Bacon. Sausage. Bologna and other processed lunch meats. Salami. Fatback. Hotdogs. Bratwurst. Salted nuts and seeds. Canned beans with added salt. Canned or smoked fish. Whole eggs or egg yolks. Chicken or turkey with skin. Dairy Whole or 2% milk, cream, and half-and-half. Whole or full-fat cream cheese. Whole-fat or sweetened yogurt. Full-fat cheese. Nondairy creamers. Whipped toppings.  Processed cheese and cheese spreads. Fats and oils Butter. Stick margarine. Lard. Shortening. Ghee. Bacon fat. Tropical oils, such as coconut, palm kernel, or palm oil. Seasoning and other foods Salted popcorn and pretzels. Onion salt, garlic salt, seasoned salt, table salt, and sea salt. Worcestershire sauce. Tartar sauce. Barbecue sauce. Teriyaki sauce. Soy sauce, including reduced-sodium. Steak sauce. Canned and packaged gravies. Fish sauce. Oyster sauce. Cocktail sauce. Horseradish that you find on the shelf. Ketchup. Mustard. Meat flavorings and tenderizers. Bouillon cubes. Hot sauce and Tabasco sauce. Premade or packaged marinades. Premade or packaged taco seasonings. Relishes. Regular salad dressings. Where to find more information:  National Heart, Lung, and Blood Institute: www.nhlbi.nih.gov  American Heart Association: www.heart.org Summary  The DASH eating plan is a healthy eating plan that has been shown to reduce high blood pressure (hypertension). It may also reduce your risk for type 2 diabetes, heart disease, and stroke.  With the DASH eating plan, you should limit salt (sodium) intake to 2,300 mg a day. If you have hypertension, you may need to reduce your sodium intake to 1,500 mg a day.  When on the DASH eating plan, aim to eat more fresh fruits and vegetables, whole grains, lean proteins, low-fat dairy, and heart-healthy fats.  Work with your health care provider or diet and nutrition specialist (dietitian) to adjust your eating plan to your   individual calorie needs. This information is not intended to replace advice given to you by your health care provider. Make sure you discuss any questions you have with your health care provider. Document Revised: 12/29/2016 Document Reviewed: 01/10/2016 Elsevier Patient Education  2020 [employer]  

## 2019-06-13 NOTE — Assessment & Plan Note (Addendum)
A1C downward trend today with focus on diet and exercise, A1C 5.8%, continue focus on diet and exercise regimen.  BMP today.

## 2019-06-13 NOTE — Assessment & Plan Note (Signed)
Chronic, ongoing.  Will continue Atorvastatin 10 MG at this time and adjust dose as needed.  Lipid panel today.

## 2019-06-14 LAB — BASIC METABOLIC PANEL
BUN/Creatinine Ratio: 15 (ref 12–28)
BUN: 13 mg/dL (ref 8–27)
CO2: 24 mmol/L (ref 20–29)
Calcium: 9.5 mg/dL (ref 8.7–10.3)
Chloride: 101 mmol/L (ref 96–106)
Creatinine, Ser: 0.87 mg/dL (ref 0.57–1.00)
GFR calc Af Amer: 84 mL/min/{1.73_m2} (ref >59)
GFR calc non Af Amer: 73 mL/min/{1.73_m2} (ref >59)
Glucose: 87 mg/dL (ref 65–99)
Potassium: 4.2 mmol/L (ref 3.5–5.2)
Sodium: 142 mmol/L (ref 134–144)

## 2019-06-14 LAB — LIPID PANEL
Chol/HDL Ratio: 3.4 ratio (ref 0.0–4.4)
Cholesterol, Total: 151 mg/dL (ref 100–199)
HDL: 44 mg/dL (ref >39)
LDL Chol Calc (NIH): 76 mg/dL (ref 0–99)
Triglycerides: 181 mg/dL — ABNORMAL HIGH (ref 0–149)
VLDL Cholesterol Cal: 31 mg/dL (ref 5–40)

## 2019-06-15 NOTE — Progress Notes (Signed)
Contacted via [place] evening Miguelina.  Your labs have returned and electrolytes look good, as does kidney function.  Cholesterol levels are looking much better with Atorvastatin 10 MG daily -- your LDL (bad cholesterol) went from 151 to 76 and total cholesterol from 214 to 151.  Continue this current dose, as it is working well.  Any questions? Keep being awesome!! Kindest regards, Jolene

## 2019-10-10 ENCOUNTER — Other Ambulatory Visit: Payer: Self-pay | Admitting: Surgery

## 2019-10-15 ENCOUNTER — Other Ambulatory Visit: Payer: Self-pay | Admitting: Nurse Practitioner

## 2019-10-15 DIAGNOSIS — Z1231 Encounter for screening mammogram for malignant neoplasm of breast: Secondary | ICD-10-CM

## 2019-10-21 ENCOUNTER — Encounter
Admission: RE | Admit: 2019-10-21 | Discharge: 2019-10-21 | Disposition: A | Discharge status: Home / Self Care | Source: Ambulatory Visit | Attending: Surgery | Admitting: Surgery

## 2019-10-21 ENCOUNTER — Other Ambulatory Visit: Payer: Self-pay

## 2019-10-21 NOTE — Patient Instructions (Signed)
Your procedure is scheduled on: September 28, 2021TUESDAY Report to Day Surgery on the 2nd floor of the [facility]. To find out your arrival time, please call [phone] between 1PM - 3PM on: Monday October 27, 2019  REMEMBER: Instructions that are not followed completely may result in serious medical risk, up to and including death; or upon the discretion of your surgeon and anesthesiologist your surgery may need to be rescheduled.  Do not eat food after midnight the night before surgery.  No gum chewing, lozengers or hard candies.  You may however, drink CLEAR liquids up to 2 hours before you are scheduled to arrive for your surgery. Do not drink anything within 2 hours of your scheduled arrival time.  Clear liquids include: - water  - apple juice without pulp - gatorade (not RED) - black coffee or tea (Do NOT add milk or creamers to the coffee or tea) Do NOT drink anything that is not on this list.  Type 1 and Type 2 diabetics should only drink water.  In addition, your doctor has ordered for you to drink the provided  Ensure Pre-Surgery Clear Carbohydrate Drink  Drinking this carbohydrate drink up to two hours before surgery helps to reduce insulin resistance and improve patient outcomes. Please complete drinking 2 hours prior to scheduled arrival time.  TAKE THESE MEDICATIONS THE MORNING OF SURGERY WITH A SIP OF WATER: ATORVASTATIN CITALOPRAM  One week prior to surgery: Stop Anti-inflammatories (NSAIDS) such as Advil, Aleve, Ibuprofen, Motrin, Naproxen, Naprosyn and ASPIRIN OR  Aspirin based products such as Excedrin, Goodys Powder, BC Powder. Stop ANY OVER THE COUNTER supplements until after surgery. (You may continue taking Tylenol, Vitamin D, Vitamin B, and multivitamin.)  No Alcohol for 24 hours before or after surgery.  No Smoking including e-cigarettes for 24 hours prior to surgery.  No chewable tobacco products for at least 6 hours prior to surgery.  No  nicotine patches on the day of surgery.  Do not use any "recreational" drugs for at least a week prior to your surgery.  Please be advised that the combination of cocaine and anesthesia may have negative outcomes, up to and including death. If you test positive for cocaine, your surgery will be cancelled.  On the morning of surgery brush your teeth with toothpaste and water, you may rinse your mouth with mouthwash if you wish. Do not swallow any toothpaste or mouthwash.  Do not wear jewelry, make-up, hairpins, clips or nail polish.  Do not wear lotions, powders, or perfumes DEODORANT  Do not shave 48 hours prior to surgery.   Contact lenses, hearing aids and dentures may not be worn into surgery.  Do not bring valuables to the hospital. [facility] is not responsible for any missing/lost belongings or valuables.   Use CHG Soap  as directed on instruction sheet.  Notify your doctor if there is any change in your medical condition (cold, fever, infection).  Wear comfortable clothing (specific to your surgery type) to the hospital.  Plan for stool softeners for home use; pain medications have a tendency to cause constipation. You can also help prevent constipation by eating foods high in fiber such as fruits and vegetables and drinking plenty of fluids as your diet allows.  After surgery, you can help prevent lung complications by doing breathing exercises.  Take deep breaths and cough every 1-2 hours. Your doctor may order a device called an Incentive Spirometer to help you take deep breaths. When coughing or sneezing, hold  a pillow firmly against your incision with both hands. This is called "splinting." Doing this helps protect your incision. It also decreases belly discomfort.  If you are being discharged the day of surgery, you will not be allowed to drive home. You will need a responsible adult (18 years or older) to drive you home and stay with you that night.   Please call the  [facility] at [phone] if you have any questions about these instructions.  Visitation Policy:  Patients undergoing a surgery or procedure may have one family member or support person with them as long as that person is not COVID-19 positive or experiencing its symptoms.  That person may remain in the waiting area during the procedure.  Inpatient Visitation Update:   In an effort to ensure the safety of our team members and our patients, we are implementing a change to our visitation policy:  Effective Monday, Aug. 9, at 7 a.m., inpatients will be allowed one support person.  o The support person may change daily.  o The support person must pass our screening, gel in and out, and wear a mask at all times, including in the patient's room.  o Patients must also wear a mask when staff or their support person are in the room.  o Masking is required regardless of vaccination status.  Systemwide, no visitors 17 or younger.

## 2019-10-24 ENCOUNTER — Other Ambulatory Visit
Admission: RE | Admit: 2019-10-24 | Discharge: 2019-10-24 | Disposition: A | Discharge status: Home / Self Care | Payer: Commercial Managed Care - PPO | Source: Ambulatory Visit | Attending: Surgery | Admitting: Surgery

## 2019-10-24 ENCOUNTER — Other Ambulatory Visit: Payer: Commercial Managed Care - PPO

## 2019-10-24 ENCOUNTER — Other Ambulatory Visit: Payer: Self-pay

## 2019-10-24 DIAGNOSIS — Z01812 Encounter for preprocedural laboratory examination: Secondary | ICD-10-CM | POA: Insufficient documentation

## 2019-10-24 DIAGNOSIS — Z20822 Contact with and (suspected) exposure to covid-19: Secondary | ICD-10-CM | POA: Insufficient documentation

## 2019-10-24 LAB — SARS CORONAVIRUS 2 (TAT 6-24 HRS): SARS Coronavirus 2: NEGATIVE

## 2019-10-28 ENCOUNTER — Encounter: Payer: Self-pay | Admitting: Surgery

## 2019-10-28 ENCOUNTER — Ambulatory Visit
Admission: RE | Admit: 2019-10-28 | Discharge: 2019-10-28 | Disposition: A | Discharge status: Home / Self Care | Payer: Commercial Managed Care - PPO | Attending: Surgery | Admitting: Surgery

## 2019-10-28 ENCOUNTER — Encounter
Admission: RE | Disposition: A | Discharge status: Home / Self Care | Payer: Self-pay | Source: Home / Self Care | Attending: Surgery

## 2019-10-28 ENCOUNTER — Ambulatory Visit: Payer: Commercial Managed Care - PPO | Admitting: Anesthesiology

## 2019-10-28 ENCOUNTER — Other Ambulatory Visit: Payer: Self-pay

## 2019-10-28 DIAGNOSIS — K219 Gastro-esophageal reflux disease without esophagitis: Secondary | ICD-10-CM | POA: Insufficient documentation

## 2019-10-28 DIAGNOSIS — F329 Major depressive disorder, single episode, unspecified: Secondary | ICD-10-CM | POA: Insufficient documentation

## 2019-10-28 DIAGNOSIS — Z79899 Other long term (current) drug therapy: Secondary | ICD-10-CM | POA: Diagnosis not present

## 2019-10-28 DIAGNOSIS — Z8262 Family history of osteoporosis: Secondary | ICD-10-CM | POA: Insufficient documentation

## 2019-10-28 DIAGNOSIS — R519 Headache, unspecified: Secondary | ICD-10-CM | POA: Diagnosis not present

## 2019-10-28 DIAGNOSIS — Z8261 Family history of arthritis: Secondary | ICD-10-CM | POA: Insufficient documentation

## 2019-10-28 DIAGNOSIS — G5602 Carpal tunnel syndrome, left upper limb: Secondary | ICD-10-CM | POA: Diagnosis not present

## 2019-10-28 DIAGNOSIS — F419 Anxiety disorder, unspecified: Secondary | ICD-10-CM | POA: Insufficient documentation

## 2019-10-28 HISTORY — PX: CARPAL TUNNEL RELEASE: SHX101

## 2019-10-28 SURGERY — RELEASE, CARPAL TUNNEL, ENDOSCOPIC
Anesthesia: General | Site: Wrist | Laterality: Left

## 2019-10-28 MED ORDER — PHENYLEPHRINE HCL (PRESSORS) 10 MG/ML IV SOLN
INTRAVENOUS | Status: DC | PRN
  Administered 2019-10-28: 100 ug via INTRAVENOUS

## 2019-10-28 MED ORDER — ACETAMINOPHEN 10 MG/ML IV SOLN
INTRAVENOUS | Status: AC
  Filled 2019-10-28: qty 100

## 2019-10-28 MED ORDER — DEXAMETHASONE SODIUM PHOSPHATE 10 MG/ML IJ SOLN
INTRAMUSCULAR | Status: DC | PRN
  Administered 2019-10-28: 5 mg via INTRAVENOUS

## 2019-10-28 MED ORDER — CHLORHEXIDINE GLUCONATE 0.12 % MT SOLN: 15.0000 mL | Freq: Once | OROMUCOSAL | Status: AC

## 2019-10-28 MED ORDER — FENTANYL CITRATE (PF) 100 MCG/2ML IJ SOLN
INTRAMUSCULAR | Status: AC
  Filled 2019-10-28: qty 2

## 2019-10-28 MED ORDER — FENTANYL CITRATE (PF) 100 MCG/2ML IJ SOLN
INTRAMUSCULAR | Status: DC | PRN
Start: 2019-10-28 — End: 2019-10-28
  Administered 2019-10-28: 50 ug via INTRAVENOUS
  Administered 2019-10-28: 25 ug via INTRAVENOUS

## 2019-10-28 MED ORDER — LACTATED RINGERS IV SOLN: INTRAVENOUS | Status: DC

## 2019-10-28 MED ORDER — CEFAZOLIN SODIUM-DEXTROSE 2-4 GM/100ML-% IV SOLN
INTRAVENOUS | Status: AC
  Filled 2019-10-28: qty 100

## 2019-10-28 MED ORDER — GLYCOPYRROLATE 0.2 MG/ML IJ SOLN
INTRAMUSCULAR | Status: DC | PRN
  Administered 2019-10-28: .2 mg via INTRAVENOUS

## 2019-10-28 MED ORDER — BUPIVACAINE HCL (PF) 0.5 % IJ SOLN
INTRAMUSCULAR | Status: AC
  Filled 2019-10-28: qty 30

## 2019-10-28 MED ORDER — FAMOTIDINE 20 MG PO TABS: 20.0000 mg | ORAL_TABLET | Freq: Once | ORAL | Status: AC

## 2019-10-28 MED ORDER — PROPOFOL 10 MG/ML IV BOLUS
INTRAVENOUS | Status: AC
  Filled 2019-10-28: qty 60

## 2019-10-28 MED ORDER — ACETAMINOPHEN 10 MG/ML IV SOLN
INTRAVENOUS | Status: DC | PRN
  Administered 2019-10-28: 1000 mg via INTRAVENOUS

## 2019-10-28 MED ORDER — ONDANSETRON HCL 4 MG/2ML IJ SOLN
INTRAMUSCULAR | Status: DC | PRN
  Administered 2019-10-28: 4 mg via INTRAVENOUS

## 2019-10-28 MED ORDER — CHLORHEXIDINE GLUCONATE 0.12 % MT SOLN
OROMUCOSAL | Status: AC
  Administered 2019-10-28: 15 mL via OROMUCOSAL
  Filled 2019-10-28: qty 15

## 2019-10-28 MED ORDER — HYDROCODONE-ACETAMINOPHEN 5-325 MG PO TABS: 1.0000 | ORAL_TABLET | Freq: Four times a day (QID) | ORAL | 0 refills | Status: DC | PRN

## 2019-10-28 MED ORDER — MIDAZOLAM HCL 2 MG/2ML IJ SOLN
INTRAMUSCULAR | Status: AC
  Filled 2019-10-28: qty 2

## 2019-10-28 MED ORDER — PROPOFOL 10 MG/ML IV BOLUS
INTRAVENOUS | Status: DC | PRN
  Administered 2019-10-28: 150 mg via INTRAVENOUS
  Administered 2019-10-28: 50 mg via INTRAVENOUS

## 2019-10-28 MED ORDER — FAMOTIDINE 20 MG PO TABS
ORAL_TABLET | ORAL | Status: AC
  Administered 2019-10-28: 20 mg via ORAL
  Filled 2019-10-28: qty 1

## 2019-10-28 MED ORDER — BUPIVACAINE HCL (PF) 0.5 % IJ SOLN
INTRAMUSCULAR | Status: DC | PRN
  Administered 2019-10-28: 10 mL

## 2019-10-28 MED ORDER — LIDOCAINE HCL (CARDIAC) PF 100 MG/5ML IV SOSY
PREFILLED_SYRINGE | INTRAVENOUS | Status: DC | PRN
  Administered 2019-10-28: 80 mg via INTRAVENOUS

## 2019-10-28 MED ORDER — ORAL CARE MOUTH RINSE: 15.0000 mL | Freq: Once | OROMUCOSAL | Status: AC

## 2019-10-28 MED ORDER — CEFAZOLIN SODIUM-DEXTROSE 2-4 GM/100ML-% IV SOLN
2.0000 g | INTRAVENOUS | Status: AC
  Administered 2019-10-28: 2 g via INTRAVENOUS

## 2019-10-28 MED ORDER — EPHEDRINE SULFATE 50 MG/ML IJ SOLN
INTRAMUSCULAR | Status: DC | PRN
  Administered 2019-10-28: 5 mg via INTRAVENOUS

## 2019-10-28 SURGICAL SUPPLY — 32 items
APL PRP STRL LF DISP 70% ISPRP (MISCELLANEOUS) ×1
BNDG COHESIVE 4X5 TAN STRL (GAUZE/BANDAGES/DRESSINGS) ×2 IMPLANT
BNDG ELASTIC 2X5.8 VLCR STR LF (GAUZE/BANDAGES/DRESSINGS) ×2 IMPLANT
BNDG ESMARK 4X12 TAN STRL LF (GAUZE/BANDAGES/DRESSINGS) ×2 IMPLANT
CANISTER SUCT 1200ML W/VALVE (MISCELLANEOUS) ×2 IMPLANT
CHLORAPREP W/TINT 26 (MISCELLANEOUS) ×2 IMPLANT
CORD BIP STRL DISP 12FT (MISCELLANEOUS) ×2 IMPLANT
COVER WAND RF STERILE (DRAPES) ×2 IMPLANT
CUFF TOURN SGL QUICK 18X4 (TOURNIQUET CUFF) ×2 IMPLANT
DRAPE SURG 17X11 SM STRL (DRAPES) ×2 IMPLANT
FORCEPS JEWEL BIP 4-3/4 STR (INSTRUMENTS) ×2 IMPLANT
GAUZE SPONGE 4X4 12PLY STRL (GAUZE/BANDAGES/DRESSINGS) ×2 IMPLANT
GAUZE XEROFORM 1X8 LF (GAUZE/BANDAGES/DRESSINGS) ×2 IMPLANT
GLOVE BIO SURGEON STRL SZ8 (GLOVE) ×2 IMPLANT
GLOVE INDICATOR 8.0 STRL GRN (GLOVE) ×2 IMPLANT
GOWN STRL REUS W/ TWL LRG LVL3 (GOWN DISPOSABLE) ×1 IMPLANT
GOWN STRL REUS W/ TWL XL LVL3 (GOWN DISPOSABLE) ×1 IMPLANT
GOWN STRL REUS W/TWL LRG LVL3 (GOWN DISPOSABLE) ×2
GOWN STRL REUS W/TWL XL LVL3 (GOWN DISPOSABLE) ×2
KIT CARPAL TUNNEL (MISCELLANEOUS) ×2
KIT ESCP INSRT D SLOT CANN KN (MISCELLANEOUS) ×1 IMPLANT
KIT TURNOVER KIT A (KITS) ×2 IMPLANT
NS IRRIG 500ML POUR BTL (IV SOLUTION) ×2 IMPLANT
PACK EXTREMITY (MISCELLANEOUS) ×2 IMPLANT
SPLINT WRIST LG LT TX990309 (SOFTGOODS) IMPLANT
SPLINT WRIST LG RT TX900304 (SOFTGOODS) IMPLANT
SPLINT WRIST M LT TX990308 (SOFTGOODS) IMPLANT
SPLINT WRIST M RT TX990303 (SOFTGOODS) IMPLANT
SPLINT WRIST XL LT TX990310 (SOFTGOODS) IMPLANT
SPLINT WRIST XL RT TX990305 (SOFTGOODS) IMPLANT
STOCKINETTE IMPERVIOUS 9X36 MD (GAUZE/BANDAGES/DRESSINGS) ×2 IMPLANT
SUT PROLENE 4 0 PS 2 18 (SUTURE) ×2 IMPLANT

## 2019-10-28 NOTE — Op Note (Signed)
10/28/2019  8:26 AM  Patient:   Suzanne Scott  Pre-Op Diagnosis:   Left carpal tunnel syndrome.  Post-Op Diagnosis:   Same.  Procedure:   Endoscopic left carpal tunnel release.  Surgeon:   Pascal Lux, MD  Anesthesia:   General LMA  Findings:   As above.  Complications:   None  EBL:   0 cc  Fluids:   700 cc crystalloid  TT:   16 minutes at 250 mmHg  Drains:   None  Closure:   4-0 Prolene interrupted sutures  Brief Clinical Note:   The patient is a 59 year old female with a long history of progressively worsening pain and paresthesias to her left hand. Her symptoms have progressed despite medications, activity modifications, etc. Her history and examination are consistent with carpal tunnel syndrome, confirmed by EMG. The patient presents at this time for an endoscopic left carpal tunnel release.   Procedure:   The patient was brought into the operating room and lain in the supine position. After adequate general laryngeal mask anesthesia was obtained, the left hand and upper extremity were prepped with ChloraPrep solution before being draped sterilely. Preoperative antibiotics were administered. A timeout was performed to verify the appropriate surgical site before the limb was exsanguinated with an Esmarch and the tourniquet inflated to 250 mmHg. An approximately 1.5-2 cm incision was made over the volar wrist flexion crease, centered over the palmaris longus tendon. The incision was carried down through the subcutaneous tissues with care taken to identify and protect any neurovascular structures. The distal forearm fascia was penetrated just proximal to the transverse carpal ligament. The soft tissues were released off the superficial and deep surfaces of the distal forearm fascia and this was released proximally for 3-4 cm under direct visualization.  Attention was directed distally. The Soil scientist was passed beneath the transverse carpal ligament along the ulnar  aspect of the carpal tunnel and used to release any adhesions as well as to remove any adherent synovial tissue before first the smaller then the larger of the two dilators were passed beneath the transverse carpal ligament along the ulnar margin of the carpal tunnel. The slotted cannula was introduced and the endoscope was placed into the slotted cannula and the undersurface of the transverse carpal ligament visualized. The distal margin of the transverse carpal ligament was marked by placing a 25-gauge needle percutaneously at [place] cardinal point so that it entered the distal portion of the slotted cannula. Under endoscopic visualization, the transverse carpal ligament was released from proximal to distal using the end-cutting blade. A second pass was performed to ensure complete release of the ligament. The adequacy of release was verified both endoscopically and by palpation using the freer elevator.  The wound was irrigated thoroughly with sterile saline solution before being closed using 4-0 Prolene interrupted sutures. A total of 10 cc of 0.5% plain Sensorcaine was injected in and around the incision before a sterile bulky dressing was applied to the wound. The patient was placed into a volar wrist splint before being awakened and returned to the recovery room in satisfactory condition after tolerating the procedure well.

## 2019-10-28 NOTE — OR Nursing (Signed)
Left extremity fingers warm and dry, cap refill brisk, able to move all fingers.  Patient desires discharge.

## 2019-10-28 NOTE — Anesthesia Postprocedure Evaluation (Signed)
Anesthesia Post Note  Patient: Suzanne Scott  Procedure(s) Performed: CARPAL TUNNEL RELEASE ENDOSCOPIC (Left Wrist)  Patient location during evaluation: PACU Anesthesia Type: General Level of consciousness: awake and alert Pain management: pain level controlled Vital Signs Assessment: post-procedure vital signs reviewed and stable Respiratory status: spontaneous breathing, nonlabored ventilation, respiratory function stable and patient connected to nasal cannula oxygen Cardiovascular status: blood pressure returned to baseline and stable Postop Assessment: no apparent nausea or vomiting Anesthetic complications: no   No complications documented.   Last Vitals:  Vitals:   10/28/19 0908 10/28/19 0920  BP: (!) 149/85 140/81  Pulse: 77 68  Resp: 16 16  Temp: 36.4 C   SpO2: 97% 99%    Last Pain:  Vitals:   10/28/19 0925  TempSrc:   PainSc: 0-No pain                 Arita Miss

## 2019-10-28 NOTE — Anesthesia Preprocedure Evaluation (Signed)
Anesthesia Evaluation  Patient identified by MRN, date of birth, ID band Patient awake    Reviewed: Allergy & Precautions, NPO status , Patient's Chart, lab work & pertinent test results, reviewed documented beta blocker date and time   History of Anesthesia Complications Negative for: history of anesthetic complications  Airway Mallampati: II  TM Distance: >3 FB Neck ROM: Full    Dental  (+) Chipped, Dental Advisory Given   Pulmonary neg pulmonary ROS, neg sleep apnea, neg COPD, Patient abstained from smoking.Not current smoker,    Pulmonary exam normal breath sounds clear to auscultation       Cardiovascular Exercise Tolerance: Good METS(-) hypertension(-) CAD and (-) Past MI negative cardio ROS  (-) dysrhythmias  Rhythm:Regular Rate:Normal - Systolic murmurs    Neuro/Psych  Headaches, PSYCHIATRIC DISORDERS Anxiety Depression    GI/Hepatic GERD  Controlled,(+)     (-) substance abuse  ,   Endo/Other  neg diabetes  Renal/GU negative Renal ROS     Musculoskeletal  (+) Arthritis ,   Abdominal   Peds  Hematology   Anesthesia Other Findings Past Medical History: No date: Anxiety No date: Atrophic vaginitis No date: Hyperlipidemia  Reproductive/Obstetrics                             Anesthesia Physical  Anesthesia Plan  ASA: II  Anesthesia Plan: General   Post-op Pain Management:    Induction: Intravenous  PONV Risk Score and Plan: 3 and Ondansetron, Dexamethasone and Midazolam  Airway Management Planned: LMA  Additional Equipment: None  Intra-op Plan:   Post-operative Plan: Extubation in OR  Informed Consent: I have reviewed the patients History and Physical, chart, labs and discussed the procedure including the risks, benefits and alternatives for the proposed anesthesia with the patient or authorized representative who has indicated his/her understanding and acceptance.      Dental advisory given  Plan Discussed with: CRNA  Anesthesia Plan Comments: (Discussed risks of anesthesia with patient, including PONV, sore throat, lip/dental damage. Rare risks discussed as well, such as cardiorespiratory and neurological sequelae. Patient understands.)        Anesthesia Quick Evaluation

## 2019-10-28 NOTE — Anesthesia Procedure Notes (Signed)
Procedure Name: LMA Insertion Date/Time: 10/28/2019 7:44 AM Performed by: Lowry Bowl, CRNA Pre-anesthesia Checklist: Patient identified, Emergency Drugs available, Suction available and Patient being monitored Patient Re-evaluated:Patient Re-evaluated prior to induction Oxygen Delivery Method: Circle system utilized Preoxygenation: Pre-oxygenation with 100% oxygen Induction Type: IV induction LMA: LMA inserted LMA Size: 4.5 Number of attempts: 2 (LMA 4 did not seat well) Placement Confirmation: positive ETCO2 and breath sounds checked- equal and bilateral Tube secured with: Tape Dental Injury: Teeth and Oropharynx as per pre-operative assessment

## 2019-10-28 NOTE — H&P (Signed)
History of Present Illness:  Suzanne Scott Wilfred Curtis is a 59 y.o. female that presents to clinic today for her preoperative history and evaluation. Patient presents unaccompanied. The patient is scheduled to undergo a left carpal tunnel release on 10/28/19 by Dr. Roland Rack. The patient reports a long history of numbness in the hand along with an "ashy feeling." Patient denies any history of injury to the hand.   The patient's symptoms have progressed to the point that they decrease her quality of life. The patient has previously undergone conservative treatment including NSAIDS and activity modification without adequate control of her symptoms.  Patient does ask about returning to work following surgery.   Past Medical, Surgical, Family, Social History, Allergies, Medications:   Past Medical History:   Allergic rhinitis 11/30/2014  Last Assessment & Plan: Supportive care   Anxiety 07/16/2014  Last Assessment & Plan: Some mild improvement. Increase Citalopram to 20 mg/day. If it keeps her awake, change to morning administration.   Atrophic vaginitis 07/16/2014   Depression 02/09/2016  Last Assessment & Plan: Stable, continue present medications.   Hyperlipidemia 07/16/2014  Last Assessment & Plan: Reviewed. LDL is 152, elevated but not in statin benefit group as she is normotensive today off BP medications.   Pre-diabetes   Past Surgical History: History reviewed. No pertinent surgical history.  Current Medications:   atorvastatin (LIPITOR) 10 MG tablet Take 10 mg by mouth once daily   brimonidine tartrate (LUMIFY OPHTH) Place 1 drop into both eyes 2 (two) times daily   cholecalciferol (CHOLECALCIFEROL) 1000 unit tablet Take 1,000 Units by mouth once daily   citalopram (CELEXA) 20 MG tablet Take 20 mg by mouth once daily 1   multivitamin/folic acid/biotin (HAIR-SKIN-NAILS, MV-FA-BIOTIN, ORAL) Take 1 tablet by mouth once daily   omeprazole (PRILOSEC) 40 MG DR capsule once daily as  needed   prednisoLONE acetate (PRED FORTE) 1 % ophthalmic suspension Place 1 drop into both eyes once a week   No current facility-administered medications for this visit.   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  Tobacco Use   Smoking status: Never Smoker   Smokeless tobacco: Never Used  Scientific laboratory technician Use: Never used  Substance and Sexual Activity   Alcohol use: Never   Drug use: Not on file   Sexual activity: Defer  Other Topics Concern   Not on file  Social History Narrative   Not on file   Social Determinants of Health:   Emergency planning/management officer Strain:   Difficulty of Paying Living Expenses:  Food Insecurity:   Worried About Charity fundraiser in the Last Year:   Arboriculturist in the Last Year:  Transportation Needs:   Film/video editor (Medical):   Lack of Transportation (Non-Medical):  Physical Activity:   Days of Exercise per Week:   Minutes of Exercise per Session:  Stress:   Feeling of Stress :  Social Connections:   Frequency of Communication with Friends and Family:   Frequency of Social Gatherings with Friends and Family:   Attends Religious Services:   Active Member of Clubs or Organizations:   Attends Archivist Meetings:   Marital Status:   Family History:  Arthritis Mother   Osteoporosis (Thinning of bones) Sister   Review of Systems:  A 10+ ROS was performed, reviewed, and the pertinent orthopaedic findings are documented in the HPI.   Physical Examination:  BP 124/86   Ht 175.3 cm (5\' 9" )   Wt 77.2 kg (170 lb 3.2 oz)   BMI 25.13 kg/m   Patient is a well-developed, well-nourished female in no acute distress. Patient has normal mood and affect.  Patient is alert and oriented to person, place, and time.   HEENT: Atraumatic, normocephalic. Pupils equal and reactive to light. Extraocular motion intact. Noninjected sclera.  Cardiovascular: Regular rate and rhythm, with no murmurs, rubs, or gallops. Distal pulses palpable.  Respiratory: Lungs clear to auscultation bilaterally.   Left Upper Extremity: Normal shoulder contour. Good active and passive range of motion and stability of the shoulder, elbow, and wrist. Normal motion of the hand and digits. No swelling, erythema, or ecchymosis is noted. There is no triggering or locking of the digits noted. No thenar atrophy is visualized. No intrinsic wasting. The patient has a negative Phalen's test. The patient has a positive Tinel's test. The patient has intact pinprick touch sensation in the median, radial, and ulnar nerve distributions. There is normal grip strength and pincer strength.  Tests Performed/Reviewed:  EMG & NCV Findings:  This is an abnormal electrodiagnostic exam consistent with 1) Left mild (grade II) carpal tunnel syndrome (median nerve entrapment at wrist). 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   Impression:  Left carpal tunnel syndrome.   Plan:  The patient will benefit from a left carpal tunnel release. The risks of surgery, including infection and blood clots, were discussed with the patient. Having failed conservative treatment, the patient has elected to proceed with surgery. The postoperative course was discussed with the patient. The patient was instructed to stop all blood thinners prior to surgery. The patient understands and elects to proceed with surgery.   H&P reviewed and patient re-examined. No changes.

## 2019-10-28 NOTE — Transfer of Care (Signed)
Immediate Anesthesia Transfer of Care Note  Patient: Shreeya Recendiz [place]  Procedure(s) Performed: CARPAL TUNNEL RELEASE ENDOSCOPIC (Left Wrist)  Patient Location: PACU  Anesthesia Type:General  Level of Consciousness: awake, drowsy and patient cooperative  Airway & Oxygen Therapy: Patient Spontanous Breathing and Patient connected to face mask oxygen  Post-op Assessment: Report given to RN and Post -op Vital signs reviewed and stable  Post vital signs: Reviewed and stable  Last Vitals:  Vitals Value Taken Time  BP 153/84 10/28/19 0830  Temp 36.4 C 10/28/19 0830  Pulse 86 10/28/19 0831  Resp 14 10/28/19 0831  SpO2 100 % 10/28/19 0831  Vitals shown include unvalidated device data.  Last Pain:  Vitals:   10/28/19 0629  TempSrc: Oral  PainSc: 0-No pain         Complications: No complications documented.

## 2019-10-28 NOTE — Discharge Instructions (Addendum)
Orthopedic discharge instructions: Keep dressing dry and intact. Keep hand elevated above heart level. May shower after dressing removed on postop day 4 (Saturday). Cover sutures with Band-Aids after drying off. Apply ice to affected area frequently. Take ibuprofen 600 mg TID with meals for 7-10 days, then as necessary.    TID = 3 times per day (every 8 hours) Take ES Tylenol or pain medication as prescribed when needed.  Return for follow-up in 10-14 days or as scheduled.   AMBULATORY SURGERY  DISCHARGE INSTRUCTIONS   1) The drugs that you were given will stay in your system until tomorrow so for the next 24 hours you should not:  A) Drive an automobile B) Make any legal decisions C) Drink any alcoholic beverage   2) You may resume regular meals tomorrow.  Today it is better to start with liquids and gradually work up to solid foods.  You may eat anything you prefer, but it is better to start with liquids, then soup and crackers, and gradually work up to solid foods.   3) Please notify your doctor immediately if you have any unusual bleeding, trouble breathing, redness and pain at the surgery site, drainage, fever, or pain not relieved by medication.    4) Additional Instructions:        Please contact your physician with any problems or Same Day Surgery at [phone], Monday through Friday 6 am to 4 pm, or [place] at [facility] number at [phone].

## 2019-10-29 ENCOUNTER — Encounter: Payer: Commercial Managed Care - PPO | Admitting: Nurse Practitioner

## 2019-10-29 ENCOUNTER — Encounter: Payer: Self-pay | Admitting: Surgery

## 2019-11-10 ENCOUNTER — Other Ambulatory Visit: Payer: Self-pay

## 2019-11-10 ENCOUNTER — Ambulatory Visit (INDEPENDENT_AMBULATORY_CARE_PROVIDER_SITE_OTHER): Payer: Commercial Managed Care - PPO | Admitting: Nurse Practitioner

## 2019-11-10 ENCOUNTER — Encounter: Payer: Self-pay | Admitting: Nurse Practitioner

## 2019-11-10 VITALS — BP 114/66 | HR 66 | Temp 98.2°F | Ht 69.1 in | Wt 172.8 lb

## 2019-11-10 DIAGNOSIS — E782 Mixed hyperlipidemia: Secondary | ICD-10-CM | POA: Diagnosis not present

## 2019-11-10 DIAGNOSIS — Z0000 Encounter for general adult medical examination without abnormal findings: Secondary | ICD-10-CM | POA: Diagnosis not present

## 2019-11-10 DIAGNOSIS — F324 Major depressive disorder, single episode, in partial remission: Secondary | ICD-10-CM | POA: Diagnosis not present

## 2019-11-10 DIAGNOSIS — Z23 Encounter for immunization: Secondary | ICD-10-CM | POA: Diagnosis not present

## 2019-11-10 DIAGNOSIS — R7303 Prediabetes: Secondary | ICD-10-CM | POA: Diagnosis not present

## 2019-11-10 DIAGNOSIS — K219 Gastro-esophageal reflux disease without esophagitis: Secondary | ICD-10-CM

## 2019-11-10 DIAGNOSIS — Z1329 Encounter for screening for other suspected endocrine disorder: Secondary | ICD-10-CM

## 2019-11-10 MED ORDER — CITALOPRAM HYDROBROMIDE 40 MG PO TABS: 40.0000 mg | ORAL_TABLET | Freq: Every day | ORAL | 1 refills | Status: DC

## 2019-11-10 MED ORDER — ATORVASTATIN CALCIUM 10 MG PO TABS: 10.0000 mg | ORAL_TABLET | Freq: Every day | ORAL | 1 refills | Status: DC

## 2019-11-10 NOTE — Assessment & Plan Note (Signed)
Chronic, stable.  PHQ-9 zero today.  We will continue Celexa 40 mg, refill sent into pharmacy.  CBC, TSH checked today.  Follow-up in 6 months.

## 2019-11-10 NOTE — Patient Instructions (Addendum)
Influenza (Flu) Vaccine (Inactivated or Recombinant): What You Need to Know 1. Why get vaccinated? Influenza vaccine can prevent influenza (flu). Flu is a contagious disease that spreads around the [place] every year, usually between October and May. Anyone can get the flu, but it is more dangerous for some people. Infants and young children, people 61 years of age and older, pregnant women, and people with certain health conditions or a weakened immune system are at greatest risk of flu complications. Pneumonia, bronchitis, sinus infections and ear infections are examples of flu-related complications. If you have a medical condition, such as heart disease, cancer or diabetes, flu can make it worse. Flu can cause fever and chills, sore throat, muscle aches, fatigue, cough, headache, and runny or stuffy nose. Some people may have vomiting and diarrhea, though this is more common in children than adults. Each year thousands of people in the [place] die from flu, and many more are hospitalized. Flu vaccine prevents millions of illnesses and flu-related visits to the doctor each year. 2. Influenza vaccine CDC recommends everyone 6 months of age and older get vaccinated every flu season. Children 6 months through 8 years of age may need 2 doses during a single flu season. Everyone else needs only 1 dose each flu season. It takes about 2 weeks for protection to develop after vaccination. There are many flu viruses, and they are always changing. Each year a new flu vaccine is made to protect against three or four viruses that are likely to cause disease in the upcoming flu season. Even when the vaccine doesn't exactly match these viruses, it may still provide some protection. Influenza vaccine does not cause flu. Influenza vaccine may be given at the same time as other vaccines. 3. Talk with your health care provider Tell your vaccine provider if the person getting the vaccine:  Has had an  allergic reaction after a previous dose of influenza vaccine, or has any severe, life-threatening allergies.  Has ever had Guillain-Barr Syndrome (also called GBS). In some cases, your health care provider may decide to postpone influenza vaccination to a future visit. People with minor illnesses, such as a cold, may be vaccinated. People who are moderately or severely ill should usually wait until they recover before getting influenza vaccine. Your health care provider can give you more information. 4. Risks of a vaccine reaction  Soreness, redness, and swelling where shot is given, fever, muscle aches, and headache can happen after influenza vaccine.  There may be a very small increased risk of Guillain-Barr Syndrome (GBS) after inactivated influenza vaccine (the flu shot). Young children who get the flu shot along with pneumococcal vaccine (PCV13), and/or DTaP vaccine at the same time might be slightly more likely to have a seizure caused by fever. Tell your health care provider if a child who is getting flu vaccine has ever had a seizure. People sometimes faint after medical procedures, including vaccination. Tell your provider if you feel dizzy or have vision changes or ringing in the ears. As with any medicine, there is a very remote chance of a vaccine causing a severe allergic reaction, other serious injury, or death. 5. What if there is a serious problem? An allergic reaction could occur after the vaccinated person leaves the clinic. If you see signs of a severe allergic reaction (hives, swelling of the face and throat, difficulty breathing, a fast heartbeat, dizziness, or weakness), call 9-1-1 and get the person to the nearest hospital. For other signs that   concern you, call your health care provider. Adverse reactions should be reported to the Vaccine Adverse Event Reporting System (VAERS). Your health care provider will usually file this report, or you can do it yourself. Visit the  VAERS website at www.vaers.SamedayNews.es or call 339-148-9241.VAERS is only for reporting reactions, and VAERS staff do not give medical advice. 6. The National Vaccine Injury Compensation Program The [employer] Vaccine Injury Compensation Program (VICP) is a federal program that was created to compensate people who may have been injured by certain vaccines. Visit the VICP website at GoldCloset.com.ee or call 614-810-2719 to learn about the program and about filing a claim. There is a time limit to file a claim for compensation. 7. How can I learn more?  Ask your healthcare provider.  Call your local or state health department.  Contact the Centers for Disease Control and Prevention (CDC): ? Call (863) 240-4856 (1-800-CDC-INFO) or ? Visit CDC's https://gibson.com/ Vaccine Information Statement (Interim) Inactivated Influenza Vaccine (09/13/2017) This information is not intended to replace advice given to you by your health care provider. Make sure you discuss any questions you have with your health care provider. Document Revised: 05/07/2018 Document Reviewed: 09/17/2017 Elsevier Patient Education  2020 Elsevier Inc.   Preventive Care 93-4 Years Old, Female Preventive care refers to visits with your health care provider and lifestyle choices that can promote health and wellness. This includes:  A yearly physical exam. This may also be called an annual well check.  Regular dental visits and eye exams.  Immunizations.  Screening for certain conditions.  Healthy lifestyle choices, such as eating a healthy diet, getting regular exercise, not using drugs or products that contain nicotine and tobacco, and limiting alcohol use. What can I expect for my preventive care visit? Physical exam Your health care provider will check your:  Height and weight. This may be used to calculate body mass index (BMI), which tells if you are at a healthy weight.  Heart rate and blood  pressure.  Skin for abnormal spots. Counseling Your health care provider may ask you questions about your:  Alcohol, tobacco, and drug use.  Emotional well-being.  Home and relationship well-being.  Sexual activity.  Eating habits.  Work and work Statistician.  Method of birth control.  Menstrual cycle.  Pregnancy history. What immunizations do I need?  Influenza (flu) vaccine  This is recommended every year. Tetanus, diphtheria, and pertussis (Tdap) vaccine  You may need a Td booster every 10 years. Varicella (chickenpox) vaccine  You may need this if you have not been vaccinated. Zoster (shingles) vaccine  You may need this after age 15. Measles, mumps, and rubella (MMR) vaccine  You may need at least one dose of MMR if you were born in 1957 or later. You may also need a second dose. Pneumococcal conjugate (PCV13) vaccine  You may need this if you have certain conditions and were not previously vaccinated. Pneumococcal polysaccharide (PPSV23) vaccine  You may need one or two doses if you smoke cigarettes or if you have certain conditions. Meningococcal conjugate (MenACWY) vaccine  You may need this if you have certain conditions. Hepatitis A vaccine  You may need this if you have certain conditions or if you travel or work in places where you may be exposed to hepatitis A. Hepatitis B vaccine  You may need this if you have certain conditions or if you travel or work in places where you may be exposed to hepatitis B. Haemophilus influenzae type b (Hib) vaccine  You  may need this if you have certain conditions. Human papillomavirus (HPV) vaccine  If recommended by your health care provider, you may need three doses over 6 months. You may receive vaccines as individual doses or as more than one vaccine together in one shot (combination vaccines). Talk with your health care provider about the risks and benefits of combination vaccines. What tests do I  need? Blood tests  Lipid and cholesterol levels. These may be checked every 5 years, or more frequently if you are over 9 years old.  Hepatitis C test.  Hepatitis B test. Screening  Lung cancer screening. You may have this screening every year starting at age 35 if you have a 30-pack-year history of smoking and currently smoke or have quit within the past 15 years.  Colorectal cancer screening. All adults should have this screening starting at age 61 and continuing until age 14. Your health care provider may recommend screening at age 81 if you are at increased risk. You will have tests every 1-10 years, depending on your results and the type of screening test.  Diabetes screening. This is done by checking your blood sugar (glucose) after you have not eaten for a while (fasting). You may have this done every 1-3 years.  Mammogram. This may be done every 1-2 years. Talk with your health care provider about when you should start having regular mammograms. This may depend on whether you have a family history of breast cancer.  BRCA-related cancer screening. This may be done if you have a family history of breast, ovarian, tubal, or peritoneal cancers.  Pelvic exam and Pap test. This may be done every 3 years starting at age 66. Starting at age 30, this may be done every 5 years if you have a Pap test in combination with an HPV test. Other tests  Sexually transmitted disease (STD) testing.  Bone density scan. This is done to screen for osteoporosis. You may have this scan if you are at high risk for osteoporosis. Follow these instructions at home: Eating and drinking  Eat a diet that includes fresh fruits and vegetables, whole grains, lean protein, and low-fat dairy.  Take vitamin and mineral supplements as recommended by your health care provider.  Do not drink alcohol if: ? Your health care provider tells you not to drink. ? You are pregnant, may be pregnant, or are planning to  become pregnant.  If you drink alcohol: ? Limit how much you have to 0-1 drink a day. ? Be aware of how much alcohol is in your drink. In the [place] one drink equals one 12 oz bottle of beer (355 mL), one 5 oz glass of wine (148 mL), or one 1 oz glass of hard liquor (44 mL). Lifestyle  Take daily care of your teeth and gums.  Stay active. Exercise for at least 30 minutes on 5 or more days each week.  Do not use any products that contain nicotine or tobacco, such as cigarettes, e-cigarettes, and chewing tobacco. If you need help quitting, ask your health care provider.  If you are sexually active, practice safe sex. Use a condom or other form of birth control (contraception) in order to prevent pregnancy and STIs (sexually transmitted infections).  If told by your health care provider, take low-dose aspirin daily starting at age 84. What's next?  Visit your health care provider once a year for a well check visit.  Ask your health care provider how often you should have your eyes and  teeth checked.  Stay up to date on all vaccines. This information is not intended to replace advice given to you by your health care provider. Make sure you discuss any questions you have with your health care provider. Document Revised: 09/27/2017 Document Reviewed: 09/27/2017 Elsevier Patient Education  [place].  Restless Legs Syndrome Restless legs syndrome is a condition that causes uncomfortable feelings or sensations in the legs, especially while sitting or lying down. The sensations usually cause an overwhelming urge to move the legs. The arms can also sometimes be affected. The condition can range from mild to severe. The symptoms often interfere with a person's ability to sleep. What are the causes? The cause of this condition is not known. What increases the risk? The following factors may make you more likely to develop this condition:  Being older than 50.  Pregnancy.  Being a  woman. In general, the condition is more common in women than in men.  A family history of the condition.  Having iron deficiency.  Overuse of caffeine, nicotine, or alcohol.  Certain medical conditions, such as kidney disease, Parkinson's disease, or nerve damage.  Certain medicines, such as those for high blood pressure, nausea, colds, allergies, depression, and some heart conditions. What are the signs or symptoms? The main symptom of this condition is uncomfortable sensations in the legs, such as:  Pulling.  Tingling.  Prickling.  Throbbing.  Crawling.  Burning. Usually, the sensations:  Affect both sides of the body.  Are worse when you sit or lie down.  Are worse at night. These may wake you up or make it difficult to fall asleep.  Make you have a strong urge to move your legs.  Are temporarily relieved by moving your legs. The arms can also be affected, but this is rare. People who have this condition often have tiredness during the day because of their lack of sleep at night. How is this diagnosed? This condition may be diagnosed based on:  Your symptoms.  Blood tests. In some cases, you may be monitored in a sleep lab by a specialist (a sleep study). This can detect any disruptions in your sleep. How is this treated? This condition is treated by managing the symptoms. This may include:  Lifestyle changes, such as exercising, using relaxation techniques, and avoiding caffeine, alcohol, or tobacco.  Medicines. Anti-seizure medicines may be tried first. Follow these instructions at home:     General instructions  Take over-the-counter and prescription medicines only as told by your health care provider.  Use methods to help relieve the uncomfortable sensations, such as: ? Massaging your legs. ? Walking or stretching. ? Taking a cold or hot bath.  Keep all follow-up visits as told by your health care provider. This is  important. Lifestyle  Practice good sleep habits. For example, go to bed and get up at the same time every day. Most adults should get 7-9 hours of sleep each night.  Exercise regularly. Try to get at least 30 minutes of exercise most days of the week.  Practice ways of relaxing, such as yoga or meditation.  Avoid caffeine and alcohol.  Do not use any products that contain nicotine or tobacco, such as cigarettes and e-cigarettes. If you need help quitting, ask your health care provider. Contact a health care provider if:  Your symptoms get worse or they do not improve with treatment. Summary  Restless legs syndrome is a condition that causes uncomfortable feelings or sensations in the legs,  especially while sitting or lying down.  The symptoms often interfere with a person's ability to sleep.  This condition is treated by managing the symptoms. You may need to make lifestyle changes or take medicines. This information is not intended to replace advice given to you by your health care provider. Make sure you discuss any questions you have with your health care provider. Document Revised: 02/05/2017 Document Reviewed: 02/05/2017 Elsevier Patient Education  [place].

## 2019-11-10 NOTE — Progress Notes (Signed)
BP 114/66   Pulse 66   Temp 98.2 F (36.8 C) (Oral)   Ht 5' 9.1" (1.755 m)   Wt 172 lb 12.8 oz (78.4 kg)   LMP  (LMP Unknown)   SpO2 98%   BMI 25.44 kg/m    Subjective:    Patient ID: Suzanne Scott, female    DOB: May 08, 1960, 59 y.o.   MRN: [id number]  HPI: Suzanne Scott is a 59 y.o. female presenting on 11/10/2019 for comprehensive medical examination. Current medical complaints include:  SWALLOWING Patient that when she eats something sticky, it gets stuck in her throat and she has to cough to bring it up.  Denies difficulty, painful swallowing.  Has had concerns about eating too fast.  Does not cough when she eats food.    HYPERLIPIDEMIA Currently taking atorvastatin 10 mg daily and reports is tolerating it well. Hyperlipidemia status: excellent compliance Satisfied with current treatment?  yes Side effects:  no Medication compliance: excellent compliance Past cholesterol meds: atorvastatin  Supplements: none Aspirin:  no The 10-year ASCVD risk score [name], et al., 2013) is: 3.6%   Values used to calculate the score:     Age: 54 years     Sex: Female     Is Non-Hispanic African American: Yes     Diabetic: No     Tobacco smoker: No     Systolic Blood Pressure: 283 mmHg     Is BP treated: No     HDL Cholesterol: 44 mg/dL     Total Cholesterol: 151 mg/dL Chest pain:  no Coronary artery disease:  no Family history CAD:  no Family history early CAD:  no  DEPRESSION Is currently taking citalopram 40 mg daily.  Reports tolerating it well. Mood status: controlled Satisfied with current treatment?: yes Symptom severity: mild  Duration of current treatment : chronic Side effects: no Medication compliance: excellent compliance Psychotherapy/counseling: no  Previous psychiatric medications: citalopram 40 mg daily Depressed mood: no Anxious mood: no Anhedonia: no Significant weight loss or gain: no Insomnia: no  Fatigue: no Feelings of  worthlessness or guilt: no Impaired concentration/indecisiveness: no Suicidal ideations: no Hopelessness: no Crying spells: no Depression screen [facility] 2/9 11/10/2019 06/13/2019 02/17/2019 10/02/2018 08/21/2018  Decreased Interest 0 0 0 0 0  Down, Depressed, Hopeless 0 0 0 0 0  PHQ - 2 Score 0 0 0 0 0  Altered sleeping 0 3 0 - 0  Tired, decreased energy 0 1 0 - 1  Change in appetite 0 0 1 - 0  Feeling bad or failure about yourself  0 0 0 - 0  Trouble concentrating 0 3 3 - 3  Moving slowly or fidgety/restless 0 1 0 - 0  Suicidal thoughts 0 0 0 - 0  PHQ-9 Score 0 8 4 - 4  Difficult doing work/chores Not difficult at all - - - Not difficult at all  Some recent data might be hidden   She currently lives with: husband; no pets Menopausal Symptoms: no  Depression Screen done today and results listed below:  Depression screen [facility] 2/9 11/10/2019 06/13/2019 02/17/2019 10/02/2018 08/21/2018  Decreased Interest 0 0 0 0 0  Down, Depressed, Hopeless 0 0 0 0 0  PHQ - 2 Score 0 0 0 0 0  Altered sleeping 0 3 0 - 0  Tired, decreased energy 0 1 0 - 1  Change in appetite 0 0 1 - 0  Feeling bad or failure about yourself  0 0 0 - 0  Trouble concentrating 0 3 3 - 3  Moving slowly or fidgety/restless 0 1 0 - 0  Suicidal thoughts 0 0 0 - 0  PHQ-9 Score 0 8 4 - 4  Difficult doing work/chores Not difficult at all - - - Not difficult at all  Some recent data might be hidden   The patient does not have a history of falls. I did not complete a risk assessment for falls. A plan of care for falls was not documented.   Past Medical History:  Past Medical History:  Diagnosis Date  . Anxiety   . Atrophic vaginitis   . Hyperlipidemia     Surgical History:  Past Surgical History:  Procedure Laterality Date  . BREAST BIOPSY Left 04/08/2015   negative  . CARPAL TUNNEL RELEASE Left 10/28/2019   Procedure: CARPAL TUNNEL RELEASE ENDOSCOPIC;  Surgeon: Corky Mull, MD;  Location: [facility] ORS;  Service: Orthopedics;   Laterality: Left;  . COLONOSCOPY WITH PROPOFOL N/A 10/15/2017   Procedure: COLONOSCOPY WITH PROPOFOL;  Surgeon: Lin Landsman, MD;  Location: [facility] ENDOSCOPY;  Service: Gastroenterology;  Laterality: N/A;  . ESOPHAGOGASTRODUODENOSCOPY (EGD) WITH PROPOFOL N/A 10/15/2017   Procedure: ESOPHAGOGASTRODUODENOSCOPY (EGD) WITH PROPOFOL;  Surgeon: Lin Landsman, MD;  Location: [facility] ENDOSCOPY;  Service: Gastroenterology;  Laterality: N/A;    Medications:  Current Outpatient Medications on File Prior to Visit  Medication Sig  . cholecalciferol (VITAMIN D) 25 MCG (1000 UNIT) tablet Take 1,000 Units by mouth daily.  Marland Kitchen HYDROcodone-acetaminophen (NORCO) 5-325 MG tablet Take 1-2 tablets by mouth every 6 (six) hours as needed for moderate pain. MAXIMUM TOTAL ACETAMINOPHEN DOSE IS 4000 MG PER DAY  . Multiple Vitamins-Minerals (HAIR/SKIN/NAILS/BIOTIN) TABS Take 1 each by mouth daily.   No current facility-administered medications on file prior to visit.    Allergies:  No Known Allergies  Social History:  Social History   Socioeconomic History  . Marital status: Married    Spouse name: Not on file  . Number of children: 3  . Years of education: 14  . Highest education level: Not on file  Occupational History  . Not on file  Tobacco Use  . Smoking status: Never Smoker  . Smokeless tobacco: Never Used  Vaping Use  . Vaping Use: Never used  Substance and Sexual Activity  . Alcohol use: Not Currently  . Drug use: No  . Sexual activity: Yes  Other Topics Concern  . Not on file  Social History Narrative  . Not on file   Social Determinants of Health   Financial Resource Strain:   . Difficulty of Paying Living Expenses: Not on file  Food Insecurity:   . Worried About Charity fundraiser in the Last Year: Not on file  . Ran Out of Food in the Last Year: Not on file  Transportation Needs:   . Lack of Transportation (Medical): Not on file  . Lack of Transportation (Non-Medical): Not  on file  Physical Activity:   . Days of Exercise per Week: Not on file  . Minutes of Exercise per Session: Not on file  Stress:   . Feeling of Stress : Not on file  Social Connections:   . Frequency of Communication with Friends and Family: Not on file  . Frequency of Social Gatherings with Friends and Family: Not on file  . Attends Religious Services: Not on file  . Active Member of Clubs or Organizations: Not on file  . Attends Club  or Organization Meetings: Not on file  . Marital Status: Not on file  Intimate Partner Violence:   . Fear of Current or Ex-Partner: Not on file  . Emotionally Abused: Not on file  . Physically Abused: Not on file  . Sexually Abused: Not on file   Social History   Tobacco Use  Smoking Status Never Smoker  Smokeless Tobacco Never Used   Social History   Substance and Sexual Activity  Alcohol Use Not Currently    Family History:  Family History  Problem Relation Age of Onset  . Arthritis Mother   . Heart disease Mother   . Breast cancer Neg Hx     Past medical history, surgical history, medications, allergies, family history and social history reviewed with patient today and changes made to appropriate areas of the chart.   Review of Systems  Constitutional: Negative.  Negative for diaphoresis, fever and malaise/fatigue.  HENT: Negative.   Eyes: Negative.  Negative for blurred vision and double vision.  Respiratory: Negative for cough, shortness of breath and wheezing.   Cardiovascular: Negative.  Negative for chest pain, palpitations and leg swelling.  Gastrointestinal: Negative.  Negative for abdominal pain, blood in stool, constipation, diarrhea, heartburn, nausea and vomiting.  Genitourinary: Negative.  Negative for dysuria, frequency, hematuria and urgency.  Musculoskeletal: Negative for back pain, myalgias and neck pain.  Skin: Negative.  Negative for itching and rash.  Neurological: Negative.  Negative for dizziness, tingling,  weakness and headaches.   All other ROS negative except what is listed above and in the HPI.      Objective:    BP 114/66   Pulse 66   Temp 98.2 F (36.8 C) (Oral)   Ht 5' 9.1" (1.755 m)   Wt 172 lb 12.8 oz (78.4 kg)   LMP  (LMP Unknown)   SpO2 98%   BMI 25.44 kg/m   Wt Readings from Last 3 Encounters:  11/10/19 172 lb 12.8 oz (78.4 kg)  10/21/19 170 lb (77.1 kg)  06/13/19 168 lb 6.4 oz (76.4 kg)    Physical Exam Vitals and nursing note reviewed.  Constitutional:      General: She is not in acute distress.    Appearance: Normal appearance. She is normal weight. She is not ill-appearing or toxic-appearing.  HENT:     Head: Normocephalic and atraumatic.     Right Ear: Tympanic membrane, ear canal and external ear normal. There is no impacted cerumen.     Left Ear: Tympanic membrane, ear canal and external ear normal. There is no impacted cerumen.     Nose: Nose normal. No congestion or rhinorrhea.     Mouth/Throat:     Mouth: Mucous membranes are moist.     Pharynx: Oropharynx is clear. No oropharyngeal exudate.  Eyes:     General: No scleral icterus.    Extraocular Movements: Extraocular movements intact.     Pupils: Pupils are equal, round, and reactive to light.     Comments: R eye cataract  Cardiovascular:     Rate and Rhythm: Normal rate and regular rhythm.     Pulses: Normal pulses.     Heart sounds: Normal heart sounds. No murmur heard.   Pulmonary:     Effort: Pulmonary effort is normal. No respiratory distress.     Breath sounds: No wheezing or rhonchi.  Chest:     Breasts:        Right: Normal. No inverted nipple, mass, nipple discharge or skin change.  Left: Normal. No inverted nipple, mass, nipple discharge or skin change.  Abdominal:     General: Abdomen is flat. Bowel sounds are normal. There is no distension.     Palpations: Abdomen is soft.     Tenderness: There is no abdominal tenderness.  Musculoskeletal:        General: No swelling or  tenderness. Normal range of motion.     Cervical back: Normal range of motion and neck supple. No rigidity or tenderness.     Right lower leg: No edema.     Left lower leg: No edema.  Skin:    General: Skin is warm and dry.     Capillary Refill: Capillary refill takes less than 2 seconds.     Coloration: Skin is not jaundiced or pale.     Findings: No erythema or rash.  Neurological:     General: No focal deficit present.     Mental Status: She is alert and oriented to person, place, and time.     Motor: No weakness.     Gait: Gait normal.  Psychiatric:        Mood and Affect: Mood normal.        Behavior: Behavior normal.        Thought Content: Thought content normal.        Judgment: Judgment normal.     Results for orders placed or performed during the hospital encounter of 10/24/19  SARS CORONAVIRUS 2 (TAT 6-24 HRS) Nasopharyngeal Nasopharyngeal Swab   Specimen: Nasopharyngeal Swab  Result Value Ref Range   SARS Coronavirus 2 NEGATIVE NEGATIVE      Assessment & Plan:   Problem List Items Addressed This Visit      Digestive   GERD (gastroesophageal reflux disease)    Likely cause of coughing with certain foods.  Will trial over-the-counter omeprazole that patient already has at home for about 8 weeks.  If symptoms resolve, can discontinue after 8 weeks and use as needed.  Return to clinic if symptoms do not improve.        Other   Hyperlipidemia    Chronic, stable.  We will continue atorvastatin 10 mg daily.  Lipid levels checked today.  CMP also checked.  Refill sent in.  Follow-up in 6 months.      Relevant Medications   atorvastatin (LIPITOR) 10 MG tablet   Other Relevant Orders   Lipid Panel w/o Chol/HDL Ratio   Comprehensive metabolic panel   HgB W1U   CBC with Differential/Platelet   Depression    Chronic, stable.  PHQ-9 zero today.  We will continue Celexa 40 mg, refill sent into pharmacy.  CBC, TSH checked today.  Follow-up in 6 months.      Relevant  Medications   citalopram (CELEXA) 40 MG tablet   Other Relevant Orders   HgB A1c   CBC with Differential/Platelet   Prediabetes    Chronic, ongoing.  Will check A1c today.  Continue the good work on diet and exercise with goal of physical activity of 30 minutes 5 times weekly.      Relevant Orders   HgB A1c   CBC with Differential/Platelet    Other Visit Diagnoses    Annual physical exam    -  Primary   Relevant Orders   Lipid Panel w/o Chol/HDL Ratio   TSH   Comprehensive metabolic panel   HgB X3A   CBC with Differential/Platelet   Need for influenza vaccination  Relevant Orders   Flu Vaccine QUAD 36+ mos IM (Completed)   Screening for thyroid disorder       Relevant Orders   TSH       Follow up plan: Return in about 6 months (around 05/10/2020) for mood, hld f/u.   LABORATORY TESTING:  - Pap smear: up to date  IMMUNIZATIONS:   - Tdap: Tetanus vaccination status reviewed: last tetanus booster within 10 years. - Influenza: Administered today - Pneumovax: Not applicable - Prevnar: Not applicable - HPV: Not applicable - Zostavax vaccine: Not applicable  SCREENING: -Mammogram: Done elsewhere  - Colonoscopy: Up to date  - Bone Density: Not applicable  -Hearing Test: Not applicable  -Spirometry: Not applicable   PATIENT COUNSELING:   Advised to take 1 mg of folate supplement per day if capable of pregnancy.   Sexuality: Discussed sexually transmitted diseases, partner selection, use of condoms, avoidance of unintended pregnancy  and contraceptive alternatives.   Advised to avoid cigarette smoking.  I discussed with the patient that most people either abstain from alcohol or drink within safe limits (<=14/week and <=4 drinks/occasion for males, <=7/weeks and <= 3 drinks/occasion for females) and that the risk for alcohol disorders and other health effects rises proportionally with the number of drinks per week and how often a drinker exceeds daily  limits.  Discussed cessation/primary prevention of drug use and availability of treatment for abuse.   Diet: Encouraged to adjust caloric intake to maintain  or achieve ideal body weight, to reduce intake of dietary saturated fat and total fat, to limit sodium intake by avoiding high sodium foods and not adding table salt, and to maintain adequate dietary potassium and calcium preferably from fresh fruits, vegetables, and low-fat dairy products.    stressed the importance of regular exercise  Injury prevention: Discussed safety belts, safety helmets, smoke detector, smoking near bedding or upholstery.   Dental health: Discussed importance of regular tooth brushing, flossing, and dental visits.    NEXT PREVENTATIVE PHYSICAL DUE IN 1 YEAR. Return in about 6 months (around 05/10/2020) for mood, hld f/u.

## 2019-11-10 NOTE — Assessment & Plan Note (Signed)
Likely cause of coughing with certain foods.  Will trial over-the-counter omeprazole that patient already has at home for about 8 weeks.  If symptoms resolve, can discontinue after 8 weeks and use as needed.  Return to clinic if symptoms do not improve.

## 2019-11-10 NOTE — Assessment & Plan Note (Signed)
Chronic, ongoing.  Will check A1c today.  Continue the good work on diet and exercise with goal of physical activity of 30 minutes 5 times weekly.

## 2019-11-10 NOTE — Assessment & Plan Note (Addendum)
Chronic, stable.  We will continue atorvastatin 10 mg daily.  Lipid levels checked today.  CMP also checked.  Refill sent in.  Follow-up in 6 months.

## 2019-11-11 ENCOUNTER — Other Ambulatory Visit: Payer: Self-pay | Admitting: Nurse Practitioner

## 2019-11-11 DIAGNOSIS — K219 Gastro-esophageal reflux disease without esophagitis: Secondary | ICD-10-CM

## 2019-11-11 LAB — CBC WITH DIFFERENTIAL/PLATELET
Basophils Absolute: 0 10*3/uL (ref 0.0–0.2)
Basos: 1 %
EOS (ABSOLUTE): 0.1 10*3/uL (ref 0.0–0.4)
Eos: 2 %
Hematocrit: 44 % (ref 34.0–46.6)
Hemoglobin: 13.8 g/dL (ref 11.1–15.9)
Immature Grans (Abs): 0 10*3/uL (ref 0.0–0.1)
Immature Granulocytes: 0 %
Lymphocytes Absolute: 1.8 10*3/uL (ref 0.7–3.1)
Lymphs: 42 %
MCH: 26.7 pg (ref 26.6–33.0)
MCHC: 31.4 g/dL — ABNORMAL LOW (ref 31.5–35.7)
MCV: 85 fL (ref 79–97)
Monocytes Absolute: 0.6 10*3/uL (ref 0.1–0.9)
Monocytes: 13 %
Neutrophils Absolute: 1.8 10*3/uL (ref 1.4–7.0)
Neutrophils: 42 %
Platelets: 259 10*3/uL (ref 150–450)
RBC: 5.16 x10E6/uL (ref 3.77–5.28)
RDW: 13.6 % (ref 11.7–15.4)
WBC: 4.3 10*3/uL (ref 3.4–10.8)

## 2019-11-11 LAB — COMPREHENSIVE METABOLIC PANEL
ALT: 21 IU/L (ref 0–32)
AST: 19 IU/L (ref 0–40)
Albumin/Globulin Ratio: 1.5 (ref 1.2–2.2)
Albumin: 4.3 g/dL (ref 3.8–4.8)
Alkaline Phosphatase: 71 IU/L (ref 44–121)
BUN/Creatinine Ratio: 12 (ref 12–28)
BUN: 10 mg/dL (ref 8–27)
Bilirubin Total: 0.3 mg/dL (ref 0.0–1.2)
CO2: 27 mmol/L (ref 20–29)
Calcium: 9.2 mg/dL (ref 8.7–10.3)
Chloride: 100 mmol/L (ref 96–106)
Creatinine, Ser: 0.86 mg/dL (ref 0.57–1.00)
GFR calc Af Amer: 84 mL/min/{1.73_m2} (ref >59)
GFR calc non Af Amer: 73 mL/min/{1.73_m2} (ref >59)
Globulin, Total: 2.9 g/dL (ref 1.5–4.5)
Glucose: 78 mg/dL (ref 65–99)
Potassium: 4.3 mmol/L (ref 3.5–5.2)
Sodium: 137 mmol/L (ref 134–144)
Total Protein: 7.2 g/dL (ref 6.0–8.5)

## 2019-11-11 LAB — LIPID PANEL W/O CHOL/HDL RATIO
Cholesterol, Total: 172 mg/dL (ref 100–199)
HDL: 43 mg/dL (ref >39)
LDL Chol Calc (NIH): 108 mg/dL — ABNORMAL HIGH (ref 0–99)
Triglycerides: 119 mg/dL (ref 0–149)
VLDL Cholesterol Cal: 21 mg/dL (ref 5–40)

## 2019-11-11 LAB — TSH: TSH: 0.874 u[IU]/mL (ref 0.450–4.500)

## 2019-11-11 LAB — HEMOGLOBIN A1C
Est. average glucose Bld gHb Est-mCnc: 134 mg/dL
Hgb A1c MFr Bld: 6.3 % — ABNORMAL HIGH (ref 4.8–5.6)

## 2019-11-11 NOTE — Telephone Encounter (Signed)
Requested medication (s) are due for refill today- no  Requested medication (s) are on the active medication list -no  Future visit scheduled -no  Last refill: 11/07/18  Notes to clinic: Patient requesting RF medication discontinued at discharge- sent for review   Requested Prescriptions  Pending Prescriptions Disp Refills   omeprazole (PRILOSEC) 40 MG capsule [Pharmacy Med Name: OMEPRAZOLE 40MG  CAPSULES] 90 capsule 2    Sig: TAKE 1 CAPSULE(40 MG) BY MOUTH DAILY BEFORE BREAKFAST      Gastroenterology: Proton Pump Inhibitors Passed - 11/11/2019  3:59 PM      Passed - Valid encounter within last 12 months    Recent Outpatient Visits           Yesterday Annual physical exam   [facility] Eulogio Bear, NP   5 months ago Mixed hyperlipidemia   [place] [facility], Barbaraann Faster, NP   6 months ago Prediabetes   [place], [place] T, NP   7 months ago Elevated BP without diagnosis of hypertension   [place], [place] T, NP   8 months ago Fatigue, unspecified type   [employer], Barbaraann Faster, NP       Future Appointments             In 6 months Cann[place], Barbaraann Faster, NP [facility]                Requested Prescriptions  Pending Prescriptions Disp Refills   omeprazole (PRILOSEC) 40 MG capsule [Pharmacy Med Name: OMEPRAZOLE 40MG  CAPSULES] 90 capsule 2    Sig: TAKE 1 CAPSULE(40 MG) BY MOUTH DAILY BEFORE BREAKFAST      Gastroenterology: Proton Pump Inhibitors Passed - 11/11/2019  3:59 PM      Passed - Valid encounter within last 12 months    Recent Outpatient Visits           Yesterday Annual physical exam   [facility] [facility] Eulogio Bear, NP   5 months ago Mixed hyperlipidemia   [place], Barbaraann Faster, NP   6 months ago Prediabetes   [place], [place] T, NP   7 months ago Elevated BP without diagnosis of  hypertension   [place], [place] T, NP   8 months ago Fatigue, unspecified type   [place], Barbaraann Faster, NP       Future Appointments             In 6 months Cannady, Barbaraann Faster, NP [facility]

## 2019-11-24 ENCOUNTER — Encounter: Payer: Commercial Managed Care - PPO | Admitting: Nurse Practitioner

## 2019-11-28 ENCOUNTER — Other Ambulatory Visit: Payer: Self-pay

## 2019-11-28 ENCOUNTER — Ambulatory Visit
Admission: RE | Admit: 2019-11-28 | Discharge: 2019-11-28 | Disposition: A | Discharge status: Home / Self Care | Payer: Commercial Managed Care - PPO | Source: Ambulatory Visit | Attending: Nurse Practitioner | Admitting: Nurse Practitioner

## 2019-11-28 DIAGNOSIS — Z1231 Encounter for screening mammogram for malignant neoplasm of breast: Secondary | ICD-10-CM | POA: Diagnosis not present

## 2020-02-11 ENCOUNTER — Other Ambulatory Visit: Payer: Self-pay

## 2020-02-11 ENCOUNTER — Encounter: Payer: Self-pay | Admitting: Ophthalmology

## 2020-02-12 NOTE — Discharge Instructions (Signed)

## 2020-02-13 ENCOUNTER — Other Ambulatory Visit
Admission: RE | Admit: 2020-02-13 | Discharge: 2020-02-13 | Disposition: A | Discharge status: Home / Self Care | Payer: Commercial Managed Care - PPO | Source: Ambulatory Visit | Attending: Ophthalmology | Admitting: Ophthalmology

## 2020-02-13 ENCOUNTER — Other Ambulatory Visit: Payer: Self-pay

## 2020-02-13 DIAGNOSIS — Z01812 Encounter for preprocedural laboratory examination: Secondary | ICD-10-CM | POA: Diagnosis not present

## 2020-02-13 DIAGNOSIS — Z20822 Contact with and (suspected) exposure to covid-19: Secondary | ICD-10-CM | POA: Diagnosis not present

## 2020-02-14 LAB — SARS CORONAVIRUS 2 (TAT 6-24 HRS): SARS Coronavirus 2: NEGATIVE

## 2020-02-17 ENCOUNTER — Ambulatory Visit: Payer: Commercial Managed Care - PPO | Admitting: Anesthesiology

## 2020-02-17 ENCOUNTER — Encounter: Payer: Self-pay | Admitting: Ophthalmology

## 2020-02-17 ENCOUNTER — Ambulatory Visit
Admission: RE | Admit: 2020-02-17 | Discharge: 2020-02-17 | Disposition: A | Discharge status: Home / Self Care | Payer: Commercial Managed Care - PPO | Attending: Ophthalmology | Admitting: Ophthalmology

## 2020-02-17 ENCOUNTER — Encounter
Admission: RE | Disposition: A | Discharge status: Home / Self Care | Payer: Self-pay | Source: Home / Self Care | Attending: Ophthalmology

## 2020-02-17 ENCOUNTER — Other Ambulatory Visit: Payer: Self-pay

## 2020-02-17 DIAGNOSIS — H2511 Age-related nuclear cataract, right eye: Secondary | ICD-10-CM | POA: Insufficient documentation

## 2020-02-17 DIAGNOSIS — Z79899 Other long term (current) drug therapy: Secondary | ICD-10-CM | POA: Diagnosis not present

## 2020-02-17 HISTORY — PX: CATARACT EXTRACTION W/PHACO: SHX586

## 2020-02-17 SURGERY — PHACOEMULSIFICATION, CATARACT, WITH IOL INSERTION
Anesthesia: Monitor Anesthesia Care | Site: Eye | Laterality: Right

## 2020-02-17 MED ORDER — LIDOCAINE HCL (PF) 2 % IJ SOLN
INTRAOCULAR | Status: DC | PRN
  Administered 2020-02-17: 1 mL

## 2020-02-17 MED ORDER — TETRACAINE HCL 0.5 % OP SOLN
1.0000 [drp] | OPHTHALMIC | Status: DC | PRN
  Administered 2020-02-17 (×3): 1 [drp] via OPHTHALMIC

## 2020-02-17 MED ORDER — EPINEPHRINE PF 1 MG/ML IJ SOLN
INTRAOCULAR | Status: DC | PRN
  Administered 2020-02-17: 59 mL via OPHTHALMIC

## 2020-02-17 MED ORDER — ARMC OPHTHALMIC DILATING DROPS
1.0000 "application " | OPHTHALMIC | Status: DC | PRN
  Administered 2020-02-17 (×3): 1 via OPHTHALMIC

## 2020-02-17 MED ORDER — LACTATED RINGERS IV SOLN: INTRAVENOUS | Status: DC

## 2020-02-17 MED ORDER — MIDAZOLAM HCL 2 MG/2ML IJ SOLN
INTRAMUSCULAR | Status: DC | PRN
  Administered 2020-02-17: 2 mg via INTRAVENOUS

## 2020-02-17 MED ORDER — FENTANYL CITRATE (PF) 100 MCG/2ML IJ SOLN
INTRAMUSCULAR | Status: DC | PRN
  Administered 2020-02-17 (×2): 50 ug via INTRAVENOUS

## 2020-02-17 MED ORDER — BRIMONIDINE TARTRATE-TIMOLOL 0.2-0.5 % OP SOLN
OPHTHALMIC | Status: DC | PRN
  Administered 2020-02-17: 1 [drp] via OPHTHALMIC

## 2020-02-17 MED ORDER — CEFUROXIME OPHTHALMIC INJECTION 1 MG/0.1 ML
INJECTION | OPHTHALMIC | Status: DC | PRN
  Administered 2020-02-17: 0.1 mL via INTRACAMERAL

## 2020-02-17 MED ORDER — NA HYALUR & NA CHOND-NA HYALUR 0.4-0.35 ML IO KIT
PACK | INTRAOCULAR | Status: DC | PRN
  Administered 2020-02-17: 1 mL via INTRAOCULAR

## 2020-02-17 SURGICAL SUPPLY — 22 items
CANNULA ANT/CHMB 27G (MISCELLANEOUS) ×1 IMPLANT
CANNULA ANT/CHMB 27GA (MISCELLANEOUS) ×2 IMPLANT
GLOVE SURG LX 7.5 STRW (GLOVE) ×1
GLOVE SURG LX STRL 7.5 STRW (GLOVE) ×1 IMPLANT
GLOVE SURG TRIUMPH 8.0 PF LTX (GLOVE) ×2 IMPLANT
GOWN STRL REUS W/ TWL LRG LVL3 (GOWN DISPOSABLE) ×2 IMPLANT
GOWN STRL REUS W/TWL LRG LVL3 (GOWN DISPOSABLE) ×4
LENS IOL TECNIS EYHANCE 21.5 (Intraocular Lens) ×1 IMPLANT
MARKER SKIN DUAL TIP RULER LAB (MISCELLANEOUS) ×2 IMPLANT
NDL CAPSULORHEX 25GA (NEEDLE) ×1 IMPLANT
NDL FILTER BLUNT 18X1 1/2 (NEEDLE) ×2 IMPLANT
NEEDLE CAPSULORHEX 25GA (NEEDLE) ×2 IMPLANT
NEEDLE FILTER BLUNT 18X 1/2SAF (NEEDLE) ×2
NEEDLE FILTER BLUNT 18X1 1/2 (NEEDLE) ×2 IMPLANT
PACK CATARACT BRASINGTON (MISCELLANEOUS) ×2 IMPLANT
PACK EYE AFTER SURG (MISCELLANEOUS) ×2 IMPLANT
PACK OPTHALMIC (MISCELLANEOUS) ×2 IMPLANT
SOLUTION OPHTHALMIC SALT (MISCELLANEOUS) ×2 IMPLANT
SYR 3ML LL SCALE MARK (SYRINGE) ×4 IMPLANT
SYR TB 1ML LUER SLIP (SYRINGE) ×2 IMPLANT
WATER STERILE IRR 250ML POUR (IV SOLUTION) ×2 IMPLANT
WIPE NON LINTING 3.25X3.25 (MISCELLANEOUS) ×2 IMPLANT

## 2020-02-17 NOTE — Transfer of Care (Signed)
Immediate Anesthesia Transfer of Care Note  Patient: Suzanne Scott  Procedure(s) Performed: CATARACT EXTRACTION PHACO AND INTRAOCULAR LENS PLACEMENT (IOC) RIGHT 4.93 00:41.7 11.8% (Right Eye)  Patient Location: PACU  Anesthesia Type: MAC  Level of Consciousness: awake, alert  and patient cooperative  Airway and Oxygen Therapy: Patient Spontanous Breathing and Patient connected to supplemental oxygen  Post-op Assessment: Post-op Vital signs reviewed, Patient's Cardiovascular Status Stable, Respiratory Function Stable, Patent Airway and No signs of Nausea or vomiting  Post-op Vital Signs: Reviewed and stable  Complications: No complications documented.

## 2020-02-17 NOTE — Anesthesia Preprocedure Evaluation (Signed)
Anesthesia Evaluation  Patient identified by MRN, date of birth, ID band Patient awake    Reviewed: Allergy & Precautions, H&P , NPO status , Patient's Chart, lab work & pertinent test results  Airway Mallampati: II  TM Distance: >3 FB Neck ROM: full    Dental no notable dental hx.    Pulmonary neg pulmonary ROS,    Pulmonary exam normal        Cardiovascular negative cardio ROS Normal cardiovascular exam Rhythm:regular Rate:Normal     Neuro/Psych Anxiety negative neurological ROS     GI/Hepatic Neg liver ROS, Medicated,  Endo/Other  negative endocrine ROS  Renal/GU negative Renal ROS  negative genitourinary   Musculoskeletal   Abdominal   Peds  Hematology negative hematology ROS (+)   Anesthesia Other Findings   Reproductive/Obstetrics                             Anesthesia Physical Anesthesia Plan  ASA: II  Anesthesia Plan: MAC   Post-op Pain Management:    Induction:   PONV Risk Score and Plan: Treatment may vary due to age or medical condition  Airway Management Planned:   Additional Equipment:   Intra-op Plan:   Post-operative Plan:   Informed Consent: I have reviewed the patients History and Physical, chart, labs and discussed the procedure including the risks, benefits and alternatives for the proposed anesthesia with the patient or authorized representative who has indicated his/her understanding and acceptance.       Plan Discussed with:   Anesthesia Plan Comments:         Anesthesia Quick Evaluation

## 2020-02-17 NOTE — Op Note (Signed)
LOCATION:  [place]   PREOPERATIVE DIAGNOSIS:    Nuclear sclerotic cataract right eye. H25.11   POSTOPERATIVE DIAGNOSIS:  Nuclear sclerotic cataract right eye.     PROCEDURE:  Phacoemusification with posterior chamber intraocular lens placement of the right eye   ULTRASOUND TIME: Procedure(s): CATARACT EXTRACTION PHACO AND INTRAOCULAR LENS PLACEMENT (IOC) RIGHT 4.93 00:41.7 11.8% (Right)  LENS:   Implant Name Type Inv. Item Serial No. Manufacturer Lot No. LRB No. Used Action  LENS IOL TECNIS EYHANCE 21.5 - [id number] Intraocular Lens LENS IOL TECNIS EYHANCE 21.5 [id number] [id number]   Right 1 Implanted         SURGEON:  Wyonia Hough, MD   ANESTHESIA:  Topical with tetracaine drops and 2% Xylocaine jelly, augmented with 1% preservative-free intracameral lidocaine.    COMPLICATIONS:  None.   DESCRIPTION OF PROCEDURE:  The patient was identified in the holding room and transported to the operating room and placed in the supine position under the operating microscope.  The right eye was identified as the operative eye and it was prepped and draped in the usual sterile ophthalmic fashion.   A 1 millimeter clear-corneal paracentesis was made at the 12:00 position.  0.5 ml of preservative-free 1% lidocaine was injected into the anterior chamber. The anterior chamber was filled with Viscoat viscoelastic.  A 2.4 millimeter keratome was used to make a near-clear corneal incision at the 9:00 position.  A curvilinear capsulorrhexis was made with a cystotome and capsulorrhexis forceps.  Balanced salt solution was used to hydrodissect and hydrodelineate the nucleus.   Phacoemulsification was then used in stop and chop fashion to remove the lens nucleus and epinucleus.  The remaining cortex was then removed using the irrigation and aspiration handpiece. Provisc was then placed into the capsular bag to distend it for lens placement.  A lens was then injected into the capsular bag.   The remaining viscoelastic was aspirated.   Wounds were hydrated with balanced salt solution.  The anterior chamber was inflated to a physiologic pressure with balanced salt solution.  No wound leaks were noted. Cefuroxime 0.1 ml of a 10mg /ml solution was injected into the anterior chamber for a dose of 1 mg of intracameral antibiotic at the completion of the case.   Timolol and Brimonidine drops were applied to the eye.  The patient was taken to the recovery room in stable condition without complications of anesthesia or surgery.   Sung Parodi 02/17/2020, 12:01 PM

## 2020-02-17 NOTE — H&P (Signed)

## 2020-02-17 NOTE — Anesthesia Procedure Notes (Signed)
Procedure Name: MAC Date/Time: 02/17/2020 11:44 AM Performed by: Jeannene Patella, CRNA Pre-anesthesia Checklist: Patient identified, Emergency Drugs available, Suction available, Timeout performed and Patient being monitored Patient Re-evaluated:Patient Re-evaluated prior to induction Oxygen Delivery Method: Nasal cannula Placement Confirmation: positive ETCO2

## 2020-02-17 NOTE — Anesthesia Postprocedure Evaluation (Signed)
Anesthesia Post Note  Patient: Suzanne Scott  Procedure(s) Performed: CATARACT EXTRACTION PHACO AND INTRAOCULAR LENS PLACEMENT (IOC) RIGHT 4.93 00:41.7 11.8% (Right Eye)     Patient location during evaluation: PACU Anesthesia Type: MAC Level of consciousness: awake and alert Pain management: pain level controlled Vital Signs Assessment: post-procedure vital signs reviewed and stable Respiratory status: spontaneous breathing Cardiovascular status: stable Anesthetic complications: no   No complications documented.  Gillian Scarce

## 2020-02-18 ENCOUNTER — Encounter: Payer: Self-pay | Admitting: Ophthalmology

## 2020-02-26 ENCOUNTER — Other Ambulatory Visit: Payer: Self-pay | Admitting: Nurse Practitioner

## 2020-02-26 ENCOUNTER — Telehealth: Payer: Self-pay

## 2020-02-26 DIAGNOSIS — Z111 Encounter for screening for respiratory tuberculosis: Secondary | ICD-10-CM

## 2020-02-26 NOTE — Telephone Encounter (Signed)
Copied from [place] [id number]. Topic: General - Inquiry >> Feb 26, 2020  4:20 PM Greggory Keen D wrote: Reason for CRM: Pt called asking if she can get a TB blood test done one after  Noon next week after 3:00  CB#  [phone]    Pt stated she needed this TB blood test done for her job, Pt asked if order can be placed or if she needs an office apt for this as she just needs it  Done for work and order is not in the system . S.Izzah Pasqua.

## 2020-02-26 NOTE — Telephone Encounter (Signed)
Routing to provider to advise.  

## 2020-02-26 NOTE — Telephone Encounter (Signed)
Can schedule lab visit only.  She is not scheduled to see me until April, maintain this appointment.

## 2020-02-27 NOTE — Telephone Encounter (Signed)
Please call and schedule lab appointment. Order is already in the system.

## 2020-03-01 ENCOUNTER — Encounter: Payer: Self-pay | Admitting: Ophthalmology

## 2020-03-01 ENCOUNTER — Other Ambulatory Visit: Payer: Self-pay

## 2020-03-02 ENCOUNTER — Other Ambulatory Visit: Payer: Commercial Managed Care - PPO

## 2020-03-03 ENCOUNTER — Other Ambulatory Visit: Payer: Commercial Managed Care - PPO

## 2020-03-03 ENCOUNTER — Other Ambulatory Visit: Payer: Self-pay

## 2020-03-03 DIAGNOSIS — Z111 Encounter for screening for respiratory tuberculosis: Secondary | ICD-10-CM

## 2020-03-05 LAB — QUANTIFERON-TB GOLD PLUS
QuantiFERON Mitogen Value: >10 IU/mL
QuantiFERON Nil Value: 0.1 IU/mL
QuantiFERON TB1 Ag Value: 0.08 IU/mL
QuantiFERON TB2 Ag Value: 0.09 IU/mL
QuantiFERON-TB Gold Plus: NEGATIVE

## 2020-03-05 NOTE — Progress Notes (Signed)
Contacted via MyChart  Good afternoon -- Your TB testing returned negative!!

## 2020-03-08 ENCOUNTER — Other Ambulatory Visit
Admission: RE | Admit: 2020-03-08 | Discharge: 2020-03-08 | Disposition: A | Discharge status: Home / Self Care | Payer: Commercial Managed Care - PPO | Source: Ambulatory Visit | Attending: Ophthalmology | Admitting: Ophthalmology

## 2020-03-08 ENCOUNTER — Other Ambulatory Visit: Payer: Self-pay

## 2020-03-08 DIAGNOSIS — Z20822 Contact with and (suspected) exposure to covid-19: Secondary | ICD-10-CM | POA: Insufficient documentation

## 2020-03-08 DIAGNOSIS — Z01812 Encounter for preprocedural laboratory examination: Secondary | ICD-10-CM | POA: Diagnosis present

## 2020-03-08 LAB — SARS CORONAVIRUS 2 (TAT 6-24 HRS): SARS Coronavirus 2: NEGATIVE

## 2020-03-08 NOTE — Discharge Instructions (Signed)

## 2020-03-10 ENCOUNTER — Ambulatory Visit: Payer: Commercial Managed Care - PPO | Admitting: Anesthesiology

## 2020-03-10 ENCOUNTER — Other Ambulatory Visit: Payer: Self-pay

## 2020-03-10 ENCOUNTER — Encounter: Payer: Self-pay | Admitting: Ophthalmology

## 2020-03-10 ENCOUNTER — Ambulatory Visit
Admission: RE | Admit: 2020-03-10 | Discharge: 2020-03-10 | Disposition: A | Discharge status: Home / Self Care | Payer: Commercial Managed Care - PPO | Attending: Ophthalmology | Admitting: Ophthalmology

## 2020-03-10 ENCOUNTER — Encounter
Admission: RE | Disposition: A | Discharge status: Home / Self Care | Payer: Self-pay | Source: Home / Self Care | Attending: Ophthalmology

## 2020-03-10 DIAGNOSIS — Z79899 Other long term (current) drug therapy: Secondary | ICD-10-CM | POA: Diagnosis not present

## 2020-03-10 DIAGNOSIS — H2512 Age-related nuclear cataract, left eye: Secondary | ICD-10-CM | POA: Insufficient documentation

## 2020-03-10 HISTORY — PX: CATARACT EXTRACTION W/PHACO: SHX586

## 2020-03-10 SURGERY — PHACOEMULSIFICATION, CATARACT, WITH IOL INSERTION
Anesthesia: Monitor Anesthesia Care | Site: Eye | Laterality: Left

## 2020-03-10 MED ORDER — NA HYALUR & NA CHOND-NA HYALUR 0.4-0.35 ML IO KIT
PACK | INTRAOCULAR | Status: DC | PRN
  Administered 2020-03-10: 1 mL via INTRAOCULAR

## 2020-03-10 MED ORDER — FENTANYL CITRATE (PF) 100 MCG/2ML IJ SOLN
INTRAMUSCULAR | Status: DC | PRN
  Administered 2020-03-10 (×2): 50 ug via INTRAVENOUS

## 2020-03-10 MED ORDER — TETRACAINE HCL 0.5 % OP SOLN
1.0000 [drp] | OPHTHALMIC | Status: DC | PRN
  Administered 2020-03-10 (×3): 1 [drp] via OPHTHALMIC

## 2020-03-10 MED ORDER — LACTATED RINGERS IV SOLN: INTRAVENOUS | Status: DC

## 2020-03-10 MED ORDER — CEFUROXIME OPHTHALMIC INJECTION 1 MG/0.1 ML
INJECTION | OPHTHALMIC | Status: DC | PRN
  Administered 2020-03-10: 0.1 mL via INTRACAMERAL

## 2020-03-10 MED ORDER — LIDOCAINE HCL (PF) 2 % IJ SOLN
INTRAOCULAR | Status: DC | PRN
  Administered 2020-03-10: 1 mL

## 2020-03-10 MED ORDER — ARMC OPHTHALMIC DILATING DROPS
1.0000 "application " | OPHTHALMIC | Status: DC | PRN
  Administered 2020-03-10 (×3): 1 via OPHTHALMIC

## 2020-03-10 MED ORDER — ONDANSETRON HCL 4 MG/2ML IJ SOLN: 4.0000 mg | Freq: Once | INTRAMUSCULAR | Status: DC | PRN

## 2020-03-10 MED ORDER — EPINEPHRINE PF 1 MG/ML IJ SOLN
INTRAOCULAR | Status: DC | PRN
  Administered 2020-03-10: 74 mL via OPHTHALMIC

## 2020-03-10 MED ORDER — ACETAMINOPHEN 325 MG PO TABS: 325.0000 mg | ORAL_TABLET | ORAL | Status: DC | PRN

## 2020-03-10 MED ORDER — BRIMONIDINE TARTRATE-TIMOLOL 0.2-0.5 % OP SOLN
OPHTHALMIC | Status: DC | PRN
  Administered 2020-03-10: 1 [drp] via OPHTHALMIC

## 2020-03-10 MED ORDER — ACETAMINOPHEN 160 MG/5ML PO SOLN: 325.0000 mg | ORAL | Status: DC | PRN

## 2020-03-10 MED ORDER — MIDAZOLAM HCL 2 MG/2ML IJ SOLN
INTRAMUSCULAR | Status: DC | PRN
  Administered 2020-03-10 (×2): 1 mg via INTRAVENOUS

## 2020-03-10 SURGICAL SUPPLY — 28 items
CANNULA ANT/CHMB 27G (MISCELLANEOUS) ×1 IMPLANT
CANNULA ANT/CHMB 27GA (MISCELLANEOUS) ×2 IMPLANT
GLOVE SURG LX 7.5 STRW (GLOVE) ×2
GLOVE SURG LX STRL 7.5 STRW (GLOVE) ×1 IMPLANT
GLOVE SURG TRIUMPH 8.0 PF LTX (GLOVE) ×2 IMPLANT
GOWN STRL REUS W/ TWL LRG LVL3 (GOWN DISPOSABLE) ×2 IMPLANT
GOWN STRL REUS W/TWL LRG LVL3 (GOWN DISPOSABLE) ×4
LENS IOL TECNIS EYHANCE 21.5 (Intraocular Lens) ×1 IMPLANT
MARKER SKIN DUAL TIP RULER LAB (MISCELLANEOUS) ×2 IMPLANT
NDL CAPSULORHEX 25GA (NEEDLE) ×1 IMPLANT
NDL FILTER BLUNT 18X1 1/2 (NEEDLE) ×2 IMPLANT
NDL RETROBULBAR .5 NSTRL (NEEDLE) IMPLANT
NEEDLE CAPSULORHEX 25GA (NEEDLE) ×2 IMPLANT
NEEDLE FILTER BLUNT 18X 1/2SAF (NEEDLE) ×2
NEEDLE FILTER BLUNT 18X1 1/2 (NEEDLE) ×2 IMPLANT
PACK CATARACT BRASINGTON (MISCELLANEOUS) ×2 IMPLANT
PACK EYE AFTER SURG (MISCELLANEOUS) ×2 IMPLANT
PACK OPTHALMIC (MISCELLANEOUS) ×2 IMPLANT
RING MALYGIN 7.0 (MISCELLANEOUS) IMPLANT
SOLUTION OPHTHALMIC SALT (MISCELLANEOUS) ×2 IMPLANT
SUT ETHILON 10-0 CS-B-6CS-B-6 (SUTURE)
SUT VICRYL  9 0 (SUTURE)
SUT VICRYL 9 0 (SUTURE) IMPLANT
SUTURE EHLN 10-0 CS-B-6CS-B-6 (SUTURE) IMPLANT
SYR 3ML LL SCALE MARK (SYRINGE) ×4 IMPLANT
SYR TB 1ML LUER SLIP (SYRINGE) ×2 IMPLANT
WATER STERILE IRR 250ML POUR (IV SOLUTION) ×2 IMPLANT
WIPE NON LINTING 3.25X3.25 (MISCELLANEOUS) ×2 IMPLANT

## 2020-03-10 NOTE — Transfer of Care (Signed)
Immediate Anesthesia Transfer of Care Note  Patient: Suzanne Scott  Procedure(s) Performed: CATARACT EXTRACTION PHACO AND INTRAOCULAR LENS PLACEMENT (IOC) LEFT (Left Eye)  Patient Location: PACU  Anesthesia Type: MAC  Level of Consciousness: awake, alert  and patient cooperative  Airway and Oxygen Therapy: Patient Spontanous Breathing   Post-op Assessment: Post-op Vital signs reviewed, Patient's Cardiovascular Status Stable, Respiratory Function Stable, Patent Airway and No signs of Nausea or vomiting  Post-op Vital Signs: Reviewed and stable  Complications: No complications documented.

## 2020-03-10 NOTE — Op Note (Signed)
OPERATIVE NOTE  Suzanne Scott [id number] 03/10/2020   PREOPERATIVE DIAGNOSIS:  Nuclear sclerotic cataract left eye. H25.12   POSTOPERATIVE DIAGNOSIS:    Nuclear sclerotic cataract left eye.     PROCEDURE:  Phacoemusification with posterior chamber intraocular lens placement of the left eye  Ultrasound time: Procedure(s) with comments: CATARACT EXTRACTION PHACO AND INTRAOCULAR LENS PLACEMENT (IOC) LEFT (Left) - 3.29 1:01.3 5.4%  LENS:   Implant Name Type Inv. Item Serial No. Manufacturer Lot No. LRB No. Used Action  LENS IOL TECNIS EYHANCE 21.5 - [id number] Intraocular Lens LENS IOL TECNIS EYHANCE 21.5 [id number] [id number]   Left 1 Implanted      SURGEON:  Wyonia Hough, MD   ANESTHESIA:  Topical with tetracaine drops and 2% Xylocaine jelly, augmented with 1% preservative-free intracameral lidocaine.    COMPLICATIONS:  None.   DESCRIPTION OF PROCEDURE:  The patient was identified in the holding room and transported to the operating room and placed in the supine position under the operating microscope.  The left eye was identified as the operative eye and it was prepped and draped in the usual sterile ophthalmic fashion.   A 1 millimeter clear-corneal paracentesis was made at the 1:30 position.  0.5 ml of preservative-free 1% lidocaine was injected into the anterior chamber.  The anterior chamber was filled with Viscoat viscoelastic.  A 2.4 millimeter keratome was used to make a near-clear corneal incision at the 10:30 position.  .  A curvilinear capsulorrhexis was made with a cystotome and capsulorrhexis forceps.  Balanced salt solution was used to hydrodissect and hydrodelineate the nucleus.   Phacoemulsification was then used in stop and chop fashion to remove the lens nucleus and epinucleus.  The remaining cortex was then removed using the irrigation and aspiration handpiece. Provisc was then placed into the capsular bag to distend it for lens placement.  A lens was then  injected into the capsular bag.  The remaining viscoelastic was aspirated.   Wounds were hydrated with balanced salt solution.  The anterior chamber was inflated to a physiologic pressure with balanced salt solution.  No wound leaks were noted. Cefuroxime 0.1 ml of a 10mg /ml solution was injected into the anterior chamber for a dose of 1 mg of intracameral antibiotic at the completion of the case.   Timolol and Brimonidine drops were applied to the eye.  The patient was taken to the recovery room in stable condition without complications of anesthesia or surgery.  Summers Buendia 03/10/2020, 1:15 PM

## 2020-03-10 NOTE — H&P (Signed)

## 2020-03-10 NOTE — Anesthesia Postprocedure Evaluation (Addendum)
Anesthesia Post Note  Patient: Suzanne Scott  Procedure(s) Performed: CATARACT EXTRACTION PHACO AND INTRAOCULAR LENS PLACEMENT (IOC) LEFT (Left Eye)     Patient location during evaluation: PACU Anesthesia Type: MAC Level of consciousness: awake and alert Pain management: pain level controlled Vital Signs Assessment: post-procedure vital signs reviewed and stable Respiratory status: spontaneous breathing, nonlabored ventilation, respiratory function stable and patient connected to nasal cannula oxygen Cardiovascular status: stable and blood pressure returned to baseline Postop Assessment: no apparent nausea or vomiting Anesthetic complications: no   No complications documented.  Sinda Du

## 2020-03-10 NOTE — Anesthesia Preprocedure Evaluation (Signed)
Anesthesia Evaluation  Patient identified by MRN, date of birth, ID band Patient awake    Reviewed: Allergy & Precautions, H&P , NPO status , Patient's Chart, lab work & pertinent test results  Airway Mallampati: II  TM Distance: >3 FB Neck ROM: full    Dental no notable dental hx.    Pulmonary neg pulmonary ROS,    Pulmonary exam normal breath sounds clear to auscultation       Cardiovascular Exercise Tolerance: Good hypertension, Normal cardiovascular exam Rhythm:regular Rate:Normal  HLD   Neuro/Psych PSYCHIATRIC DISORDERS Anxiety Depression negative neurological ROS     GI/Hepatic Neg liver ROS, GERD  ,  Endo/Other  negative endocrine ROS  Renal/GU negative Renal ROS  negative genitourinary   Musculoskeletal negative musculoskeletal ROS (+)   Abdominal (+) - obese,  Abdomen: soft.    Peds  Hematology negative hematology ROS (+)   Anesthesia Other Findings   Reproductive/Obstetrics negative OB ROS                             Anesthesia Physical  Anesthesia Plan  ASA: III  Anesthesia Plan: MAC   Post-op Pain Management:    Induction:   PONV Risk Score and Plan: Treatment may vary due to age or medical condition  Airway Management Planned: Natural Airway and Nasal Cannula  Additional Equipment:   Intra-op Plan:   Post-operative Plan:   Informed Consent: I have reviewed the patients History and Physical, chart, labs and discussed the procedure including the risks, benefits and alternatives for the proposed anesthesia with the patient or authorized representative who has indicated his/her understanding and acceptance.     Dental advisory given  Plan Discussed with: Surgeon and CRNA  Anesthesia Plan Comments:         Anesthesia Quick Evaluation  Patient Active Problem List   Diagnosis Date Noted  . Elevated BP without diagnosis of hypertension 02/17/2019  .  Back pain 01/14/2019  . Fatigue 01/14/2019  . Vitamin D deficiency 08/21/2018  . Prediabetes 03/10/2018  . Neuropathy 12/17/2017  . Tendinitis, de Quervain's 07/31/2017  . GERD (gastroesophageal reflux disease) 07/31/2017  . Plantar fasciitis 03/14/2016  . Depression 02/09/2016  . Allergic rhinitis 11/30/2014  . Anxiety 07/16/2014  . Atrophic vaginitis 07/16/2014  . Hyperlipidemia 07/16/2014    CBC Latest Ref Rng & Units 11/10/2019 02/17/2019 01/20/2019  WBC 3.4 - 10.8 x10E3/uL 4.3 3.6 5.6  Hemoglobin 11.1 - 15.9 g/dL 13.8 13.2 13.0  Hematocrit 34.0 - 46.6 % 44.0 40.8 40.6  Platelets 150 - 450 x10E3/uL 259 302 284   BMP Latest Ref Rng & Units 11/10/2019 06/13/2019 04/28/2019  Glucose 65 - 99 mg/dL 78 87 91  BUN 8 - 27 mg/dL _0 Creatinine 0.57 - 1.00 mg/dL 0.86 0.87 0.82  BUN/Creat Ratio 12 - _1 Sodium 134 - 144 mmol/L 137 142 140  Potassium 3.5 - 5.2 mmol/L 4.3 4.2 4.4  Chloride 96 - 106 mmol/L 100 101 102  CO2 20 - 29 mmol/L _2 Calcium 8.7 - 10.3 mg/dL 9.2 9.5 9.1    Risks and benefits of anesthesia discussed at length, patient or surrogate demonstrates understanding. Appropriately NPO. Plan to proceed with anesthesia.  Champ Mungo, MD 03/10/20

## 2020-03-10 NOTE — Anesthesia Procedure Notes (Signed)
Procedure Name: MAC Date/Time: 03/10/2020 12:50 PM Performed by: Vanetta Shawl, CRNA Pre-anesthesia Checklist: Patient identified, Emergency Drugs available, Suction available, Timeout performed and Patient being monitored Patient Re-evaluated:Patient Re-evaluated prior to induction Oxygen Delivery Method: Nasal cannula Placement Confirmation: positive ETCO2

## 2020-03-31 ENCOUNTER — Ambulatory Visit: Payer: Self-pay | Admitting: *Deleted

## 2020-03-31 NOTE — Telephone Encounter (Signed)
Noted and agree with plan.

## 2020-03-31 NOTE — Telephone Encounter (Signed)
Patient is reporting she is having abdominal pressure- constipation.Patient reports changes in bowels- but she has not completely stopped her daily bowel movement- not going as often and feeling the pressure to go and nothing coming out. Patient has not used any OTC treatment at this time. Reviewed diet, fluid intake and using OTC medication to help with bowels. Also discussed other reasons for pressure in lower abdomen/vagina.  Patient is going to try and see if  Getting her bowels more regular againt helps- if she continues to have pressure she will call back for appointment.  Reason for Disposition . MILD constipation  Answer Assessment - Initial Assessment Questions 1. STOOL PATTERN OR FREQUENCY: "How often do you pass bowel movements (BMs)?"  (Normal range: tid to q 3 days)  "When was the last BM passed?"       Normally has bowel movement 1-2/day, this morning- small amount 2. STRAINING: "Do you have to strain to have a BM?"      Feeling pressure- straining 3. RECTAL PAIN: "Does your rectum hurt when the stool comes out?" If Yes, ask: "Do you have hemorrhoids? How bad is the pain?"  (Scale 1-10; or mild, moderate, severe)     no 4. STOOL COMPOSITION: "Are the stools hard?"      Not hard 5. BLOOD ON STOOLS: "Has there been any blood on the toilet tissue or on the surface of the BM?" If Yes, ask: "When was the last time?"      No blood 6. CHRONIC CONSTIPATION: "Is this a new problem for you?"  If no, ask: "How long have you had this problem?" (days, weeks, months)      No- never had problem with constipation 7. CHANGES IN DIET OR HYDRATION: "Have there been any recent changes in your diet?" "How much fluids are you drinking consuming on a daily basis?"  "How much have you had to drink today?"     No changes in diet or fliuds, 16 oz this morning 8. MEDICATIONS: "Have you been taking any new medications?" "Are you taking any narcotic pain medications?" (e.g., Vicoden, Percocet, morphine,  dilaudid)     No changes in mediction 9. LAXATIVES: "Have you been using any stool softeners, laxatives, or enemas?"  If yes, ask "What, how often, and when was the last time?" no 10.ACTIVITY:  "How much walking do you do every day? on a daily basis?"  "Has your activity level decreased in the past week?"        A lot- patient is working 11. CAUSE: "What do you think is causing the constipation?"        Not sure 12. OTHER SYMPTOMS: "Do you have any other symptoms?" (e.g., abdominal pain, bloating, fever, vomiting)       Abdominal pressure- feels pressure 13. MEDICAL HISTORY: "Do you have a history of hemorrhoids, rectal fissures, or rectal surgery or rectal abscess?"         No  14. PREGNANCY: "Is there any chance you are pregnant?" "When was your last menstrual period?"       n/a  Protocols used: CONSTIPATION-A-AH

## 2020-04-13 ENCOUNTER — Encounter: Payer: Self-pay | Admitting: Nurse Practitioner

## 2020-04-13 ENCOUNTER — Ambulatory Visit (INDEPENDENT_AMBULATORY_CARE_PROVIDER_SITE_OTHER): Payer: Commercial Managed Care - PPO | Admitting: Nurse Practitioner

## 2020-04-13 ENCOUNTER — Other Ambulatory Visit: Payer: Self-pay

## 2020-04-13 VITALS — BP 118/76 | HR 65 | Temp 98.1°F | Wt 174.0 lb

## 2020-04-13 DIAGNOSIS — N8189 Other female genital prolapse: Secondary | ICD-10-CM | POA: Insufficient documentation

## 2020-04-13 DIAGNOSIS — Z90710 Acquired absence of both cervix and uterus: Secondary | ICD-10-CM | POA: Insufficient documentation

## 2020-04-13 NOTE — Progress Notes (Signed)
BP 118/76   Pulse 65   Temp 98.1 F (36.7 C) (Oral)   Wt 174 lb (78.9 kg)   LMP  (LMP Unknown)   SpO2 97%   BMI 25.70 kg/m    Subjective:    Patient ID: Suzanne Scott, female    DOB: 10-Nov-1960, 60 y.o.   MRN: [id number]  HPI: Suzanne Scott is a 60 y.o. female  Chief Complaint  Patient presents with  . Abdominal Pain    Patient states she is having some lower abdominal heaviness/dropping that comes and goes for a while but the last couple of weeks she has really noticed it and has gotten her attention.   ABDOMINAL HEAVINESS Has noticed over past two weeks, if lifts something feels heaviness in vagina and wets self a little.  Has had this before, but over past two weeks has noticed more.  When walking has noticed fullness at times.  Has history of 3 vaginal births. Duration:weeks Fever: no Nausea: no Vomiting: no Weight loss: no Decreased appetite: no Diarrhea: no Constipation: no Blood in stool: no Heartburn: no Jaundice: no Rash: no Dysuria/urinary frequency: no Hematuria: no History of sexually transmitted disease: no Recurrent NSAID use: no  Relevant past medical, surgical, family and social history reviewed and updated as indicated. Interim medical history since our last visit reviewed. Allergies and medications reviewed and updated.  Review of Systems  Constitutional: Negative for activity change, appetite change, diaphoresis, fatigue and fever.  Respiratory: Negative for cough, chest tightness, shortness of breath and wheezing.   Cardiovascular: Negative for chest pain, palpitations and leg swelling.  Gastrointestinal: Negative.   Endocrine: Negative.   Genitourinary: Negative for dysuria, vaginal bleeding, vaginal discharge and vaginal pain.  Neurological: Negative.   Psychiatric/Behavioral: Negative for decreased concentration, self-injury, sleep disturbance and suicidal ideas. The patient is not nervous/anxious.     Per HPI unless specifically  indicated above     Objective:    BP 118/76   Pulse 65   Temp 98.1 F (36.7 C) (Oral)   Wt 174 lb (78.9 kg)   LMP  (LMP Unknown)   SpO2 97%   BMI 25.70 kg/m   Wt Readings from Last 3 Encounters:  04/13/20 174 lb (78.9 kg)  03/10/20 172 lb 8 oz (78.2 kg)  02/17/20 169 lb (76.7 kg)    Physical Exam Vitals and nursing note reviewed. Exam conducted with a chaperone present.  Constitutional:      General: She is awake. She is not in acute distress.    Appearance: She is well-developed. She is not ill-appearing.  HENT:     Head: Normocephalic.     Right Ear: Hearing normal.     Left Ear: Hearing normal.  Eyes:     General: Lids are normal.        Right eye: No discharge.        Left eye: No discharge.     Conjunctiva/sclera: Conjunctivae normal.     Pupils: Pupils are equal, round, and reactive to light.  Neck:     Thyroid: No thyromegaly.     Vascular: No carotid bruit or JVD.  Cardiovascular:     Rate and Rhythm: Normal rate and regular rhythm.     Heart sounds: Normal heart sounds. No murmur heard. No gallop.   Pulmonary:     Effort: Pulmonary effort is normal. No accessory muscle usage or respiratory distress.     Breath sounds: Normal breath sounds.  Abdominal:  General: Bowel sounds are normal.     Palpations: Abdomen is soft.  Genitourinary:    Exam position: Lithotomy position.     Vagina: Normal.     Cervix: Normal.     Uterus: With uterine prolapse.      Comments: Slight prolapse noted in canal.  No bleeding. Musculoskeletal:     Cervical back: Normal range of motion and neck supple.     Right lower leg: No edema.     Left lower leg: No edema.  Lymphadenopathy:     Cervical: No cervical adenopathy.  Skin:    General: Skin is warm and dry.  Neurological:     Mental Status: She is alert and oriented to person, place, and time.  Psychiatric:        Attention and Perception: Attention normal.        Mood and Affect: Mood normal.        Speech:  Speech normal.        Behavior: Behavior normal. Behavior is cooperative.    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  Advised no heavy lifting at this time until visit with GYN.  Referral to GYN written and discussed at length with patient -- educated her on treatment options for prolapses and reasons for prolapses, such as decreased pelvic strength.  Return as scheduled in 4 weeks, if any worsening symptoms notify provider immediately.      Relevant Orders   Ambulatory referral to Gynecology       Follow up plan: Return for as scheduled in 4 weeks.Marland Kitchen

## 2020-04-13 NOTE — Patient Instructions (Signed)
Pelvic Organ Prolapse Pelvic organ prolapse is a condition in women that involves the stretching, bulging, or dropping of pelvic organs into an abnormal position, past the opening of the vagina. It happens when the muscles and tissues that surround and support pelvic structures become weak or stretched. Pelvic organ prolapse can involve the:  Vagina (vaginal prolapse).  Uterus (uterine prolapse).  Bladder (cystocele).  Rectum (rectocele).  Intestines (enterocele). When organs other than the vagina are involved, they often bulge into the vagina or protrude from the vagina, depending on how severe the prolapse is. What are the causes? This condition may be caused by:  Pregnancy, labor, and childbirth.  Past pelvic surgery.  Lower levels of the hormone estrogen due to menopause.  Consistently lifting more than 50 lb (23 kg).  Obesity.  Long-term difficulty passing stool (chronic constipation).  Long-term, or chronic, cough.  Fluid buildup in the abdomen due to certain conditions. What are the signs or symptoms? Symptoms of this condition include:  Leaking a little urine (loss of bladder control) when you cough, sneeze, strain, and exercise (stress incontinence). This may be worse immediately after childbirth. It may gradually improve over time.  Feeling pressure in your pelvis or vagina. This pressure may increase when you cough or when you are passing stool.  A bulge that protrudes from the opening of your vagina.  Difficulty passing urine or stool.  Pain in your lower back.  Pain or discomfort during sex, or decreased interest in sex.  Repeated bladder infections (urinary tract infections).  Difficulty inserting a tampon. In some people, this condition causes no symptoms. How is this diagnosed? This condition may be diagnosed based on a vaginal and rectal exam. During the exam, you may be asked to cough and strain while you are lying down, sitting, and standing up.  Your health care provider will determine if other tests are required, such as bladder function tests. How is this treated? Treatment for this condition may depend on your symptoms. Treatment may include:  Lifestyle changes, such as drinking plenty of fluids and eating foods that are high in fiber.  Emptying your bladder at scheduled times (bladder training therapy). This can help reduce or avoid urinary incontinence.  Estrogen. This may help mild prolapse by increasing the strength and tone of pelvic floor muscles.  Kegel exercises. These may help mild cases of prolapse by strengthening and tightening the muscles of the pelvic floor.  A soft, flexible device that helps support the vaginal walls and keep pelvic organs in place (pessary). This is inserted into your vagina by your health care provider.  Surgery. This is often the only form of treatment for severe prolapse. Follow these instructions at home: Eating and drinking  Avoid drinking beverages that contain caffeine or alcohol.  Increase your intake of high-fiber foods to decrease constipation and straining during bowel movements. Activity  Lose weight if recommended by your health care provider.  Avoid heavy lifting and straining with exercise and work. Do not hold your breath when you perform mild to moderate lifting and exercise activities. Limit your activities as directed by your health care provider.  Do Kegel exercises as directed by your health care provider. To do this: ? Squeeze your pelvic floor muscles tight. You should feel a tight lift in your rectal area and a tightness in your vaginal area. Keep your stomach, buttocks, and legs relaxed. ? Hold the muscles tight for up to 10 seconds. Then relax your muscles. ? Repeat this exercise  50 times a day, or as much as told by your health care provider. Continue to do this exercise for at least 4-6 weeks, or for as long as told by your health care provider. General  instructions  Take over-the-counter and prescription medicines only as told by your health care provider.  Wear a sanitary pad or adult diapers if you have urinary incontinence.  If you have a pessary, take care of it as told by your health care provider.  Keep all follow-up visits. This is important. Contact a health care provider if you:  Have symptoms that interfere with your daily activities or sex life.  Need medicine to help with the discomfort.  Notice bleeding from your vagina that is not related to your menstrual period.  Have a fever.  Have pain or bleeding when you urinate.  Have bleeding when you pass stool.  Pass urine when you have sex.  Have chronic constipation.  Have a pessary that falls out.  Have a foul-smelling vaginal discharge.  Have an unusual, low pain in your abdomen. Get help right away if you:  Cannot pass urine. Summary  Pelvic organ prolapse is the stretching, bulging, or dropping of pelvic organs into an abnormal position. It happens when the muscles and tissues that surround and support pelvic structures become weak or stretched.  When organs other than the vagina are involved, they often bulge into the vagina or protrude from it, depending on how severe the prolapse is.  In most cases, this condition needs to be treated only if it produces symptoms. Treatment may include lifestyle changes, estrogen, Kegel exercises, pessary insertion, or surgery.  Avoid heavy lifting and straining with exercise and work. Do not hold your breath when you perform mild to moderate lifting and exercise activities. Limit your activities as directed by your health care provider. This information is not intended to replace advice given to you by your health care provider. Make sure you discuss any questions you have with your health care provider. Document Revised: 07/14/2019 Document Reviewed: 07/14/2019 Elsevier Patient Education  [place].

## 2020-04-13 NOTE — Assessment & Plan Note (Signed)
Ongoing with recent worsening episodes over past 2 weeks -- suspect uterine prolapse.  Advised no heavy lifting at this time until visit with GYN.  Referral to GYN written and discussed at length with patient -- educated her on treatment options for prolapses and reasons for prolapses, such as decreased pelvic strength.  Return as scheduled in 4 weeks, if any worsening symptoms notify provider immediately.

## 2020-04-21 ENCOUNTER — Ambulatory Visit (INDEPENDENT_AMBULATORY_CARE_PROVIDER_SITE_OTHER): Payer: Commercial Managed Care - PPO | Admitting: Obstetrics and Gynecology

## 2020-04-21 ENCOUNTER — Encounter: Payer: Self-pay | Admitting: Obstetrics and Gynecology

## 2020-04-21 ENCOUNTER — Other Ambulatory Visit: Payer: Self-pay

## 2020-04-21 VITALS — BP 138/88 | HR 73 | Ht 69.0 in | Wt 176.0 lb

## 2020-04-21 DIAGNOSIS — R102 Pelvic and perineal pain: Secondary | ICD-10-CM | POA: Diagnosis not present

## 2020-04-21 DIAGNOSIS — N8111 Cystocele, midline: Secondary | ICD-10-CM

## 2020-04-21 NOTE — Progress Notes (Signed)
HPI:      [name] is a 60 y.o. No obstetric history on file. who LMP was No LMP recorded (lmp unknown). Patient is postmenopausal.  Subjective:   She presents today sent by her family physician with complaint of pelvic pressure symptoms.  She says she feels a pelvic heaviness, achiness on occasion.  She says she also notices something that feels like "flesh" that occasionally protrudes from the vagina. She leaks urine on occasion with coughing and sneezing but this is less than 1 time per week. She is sexually active without issue. She has had 3 prior vaginal births of 5 pound infants. Her job required heavy lifting but this has recently changed and she is no longer in the position of having to lift.    Hx: The following portions of the patient's history were reviewed and updated as appropriate:             She  has a past medical history of Anxiety, Atrophic vaginitis, and Hyperlipidemia. She does not have any pertinent problems on file. She  has a past surgical history that includes Esophagogastroduodenoscopy (egd) with propofol (N/A, 10/15/2017); Colonoscopy with propofol (N/A, 10/15/2017); Breast biopsy (Left, 04/08/2015); Carpal tunnel release (Left, 10/28/2019); Cataract extraction w/PHACO (Right, 02/17/2020); and Cataract extraction w/PHACO (Left, 03/10/2020). Her family history includes Arthritis in her mother; Heart disease in her mother. She  reports that she has never smoked. She has never used smokeless tobacco. She reports previous alcohol use. She reports that she does not use drugs. She has a current medication list which includes the following prescription(s): atorvastatin, brimonidine tartrate, cholecalciferol, citalopram, hair/skin/nails/biotin, omeprazole, prednisolone acetate, and hydrocodone-acetaminophen. She has No Known Allergies.       Review of Systems:  Review of Systems  Constitutional: Denied constitutional symptoms, night sweats, recent illness, fatigue,  fever, insomnia and weight loss.  Eyes: Denied eye symptoms, eye pain, photophobia, vision change and visual disturbance.  Ears/Nose/Throat/Neck: Denied ear, nose, throat or neck symptoms, hearing loss, nasal discharge, sinus congestion and sore throat.  Cardiovascular: Denied cardiovascular symptoms, arrhythmia, chest pain/pressure, edema, exercise intolerance, orthopnea and palpitations.  Respiratory: Denied pulmonary symptoms, asthma, pleuritic pain, productive sputum, cough, dyspnea and wheezing.  Gastrointestinal: Denied, gastro-esophageal reflux, melena, nausea and vomiting.  Genitourinary: See HPI for additional information.  Musculoskeletal: Denied musculoskeletal symptoms, stiffness, swelling, muscle weakness and myalgia.  Dermatologic: Denied dermatology symptoms, rash and scar.  Neurologic: Denied neurology symptoms, dizziness, headache, neck pain and syncope.  Psychiatric: Denied psychiatric symptoms, anxiety and depression.  Endocrine: Denied endocrine symptoms including hot flashes and night sweats.   Meds:   Current Outpatient Medications on File Prior to Visit  Medication Sig Dispense Refill  . atorvastatin (LIPITOR) 10 MG tablet Take 1 tablet (10 mg total) by mouth daily. 90 tablet 1  . Brimonidine Tartrate (LUMIFY OP) Apply to eye 2 (two) times daily.    . cholecalciferol (VITAMIN D) 25 MCG (1000 UNIT) tablet Take 1,000 Units by mouth daily.    . citalopram (CELEXA) 40 MG tablet Take 1 tablet (40 mg total) by mouth daily. 90 tablet 1  . Multiple Vitamins-Minerals (HAIR/SKIN/NAILS/BIOTIN) TABS Take 1 each by mouth daily.    Marland Kitchen omeprazole (PRILOSEC) 40 MG capsule TAKE 1 CAPSULE(40 MG) BY MOUTH DAILY BEFORE BREAKFAST 90 capsule 2  . prednisoLONE acetate (PRED FORTE) 1 % ophthalmic suspension See admin instructions.    Marland Kitchen HYDROcodone-acetaminophen (NORCO) 5-325 MG tablet Take 1-2 tablets by mouth every 6 (six) hours as needed  for moderate pain. MAXIMUM TOTAL ACETAMINOPHEN DOSE IS  4000 MG PER DAY (Patient not taking: No sig reported) 10 tablet 0   No current facility-administered medications on file prior to visit.          Objective:     Vitals:   04/21/20 0738  BP: 138/88  Pulse: 73   Filed Weights   04/21/20 0738  Weight: 176 lb (79.8 kg)              Physical examination   Pelvic:   Vulva: Normal appearance.  No lesions.  Vagina: No lesions or abnormalities noted.  Support:  Second-degree cystocele otherwise good support  Urethra No masses tenderness or scarring.  Meatus Normal size without lesions or prolapse.  Cervix: Normal appearance.  No lesions.  Anus: Normal exam.  No lesions.  Perineum: Normal exam.  No lesions.  Excellent perineal body        Bimanual   Uterus: Normal size.  Non-tender.  Mobile.  AV.  Adnexae: No masses.  Non-tender to palpation.  Cul-de-sac: Negative for abnormality.     Assessment:    No obstetric history on file. Patient Active Problem List   Diagnosis Date Noted  . Other female genital prolapse 04/13/2020  . Elevated BP without diagnosis of hypertension 02/17/2019  . Back pain 01/14/2019  . Vitamin D deficiency 08/21/2018  . Prediabetes 03/10/2018  . Neuropathy 12/17/2017  . Tendinitis, de Quervain's 07/31/2017  . GERD (gastroesophageal reflux disease) 07/31/2017  . Depression 02/09/2016  . Allergic rhinitis 11/30/2014  . Anxiety 07/16/2014  . Atrophic vaginitis 07/16/2014  . Hyperlipidemia 07/16/2014     1. Pelvic pressure in female   2. Cystocele, midline     Symptoms consistent with pelvic prolapse however exam shows a cystocele without otherwise being abnormal.  Occasional urine leakage but not limiting/disabling.   Plan:            1.  We have discussed the options for her pelvic pressure symptoms which include repair of cystocele, hysterectomy, possible urethral sling procedure.  Because she does not have significant prolapse and her exam is relatively unremarkable I recommended that  she take some time to consider her options.  Now that she knows there is nothing significant wrong perhaps she can live with her symptoms.  If not and she continues to find them limiting and irritating she will consider future surgery.  I have asked her to make an appointment anytime that she is ready to discuss this further. Orders No orders of the defined types were placed in this encounter.   No orders of the defined types were placed in this encounter.     F/U  No follow-ups on file. I spent 32 minutes involved in the care of this patient preparing to see the patient by obtaining and reviewing her medical history (including labs, imaging tests and prior procedures), documenting clinical information in the electronic health record (EHR), counseling and coordinating care plans, writing and sending prescriptions, ordering tests or procedures and directly communicating with the patient by discussing pertinent items from her history and physical exam as well as detailing my assessment and plan as noted above so that she has an informed understanding.  All of her questions were answered.  Finis Bud, M.D. 04/21/2020 8:09 AM

## 2020-05-09 ENCOUNTER — Other Ambulatory Visit: Payer: Self-pay | Admitting: Nurse Practitioner

## 2020-05-09 NOTE — Telephone Encounter (Signed)
Requested Prescriptions  Pending Prescriptions Disp Refills  . atorvastatin (LIPITOR) 10 MG tablet [Pharmacy Med Name: ATORVASTATIN 10MG  TABLETS] 90 tablet 1    Sig: TAKE 1 TABLET(10 MG) BY MOUTH DAILY     Cardiovascular:  Antilipid - Statins Failed - 05/09/2020  6:19 AM      Failed - LDL in normal range and within 360 days    LDL Chol Calc (NIH)  Date Value Ref Range Status  11/10/2019 108 (H) 0 - 99 mg/dL Final         Passed - Total Cholesterol in normal range and within 360 days    Cholesterol, Total  Date Value Ref Range Status  11/10/2019 172 100 - 199 mg/dL Final   Cholesterol [name], Waived  Date Value Ref Range Status  11/30/2014 228 (H) <200 mg/dL Final    Comment:                            Desirable                <200                         Borderline High      200- 239                         High                     >239          Passed - HDL in normal range and within 360 days    HDL  Date Value Ref Range Status  11/10/2019 43 >39 mg/dL Final         Passed - Triglycerides in normal range and within 360 days    Triglycerides  Date Value Ref Range Status  11/10/2019 119 0 - 149 mg/dL Final   Triglycerides [name],Waived  Date Value Ref Range Status  11/30/2014 122 <150 mg/dL Final    Comment:                            Normal                   <150                         Borderline High     150 - 199                         High                200 - 499                         Very High                >499          Passed - Patient is not pregnant      Passed - Valid encounter within last 12 months    Recent Outpatient Visits          3 weeks ago Other female genital prolapse   [facility] [place], Henrine Screws T, NP   6 months ago Annual physical exam   [facility] Eulogio Bear,  NP   11 months ago Mixed hyperlipidemia   [place], Barbaraann Faster, NP   1 year ago Prediabetes   [place], Chadbourn T, NP   1 year ago Elevated BP without diagnosis of hypertension   [place], Barbaraann Faster, NP      Future Appointments            Tomorrow Venita Lick, NP [facility]

## 2020-05-10 ENCOUNTER — Encounter: Payer: Self-pay | Admitting: Nurse Practitioner

## 2020-05-10 ENCOUNTER — Other Ambulatory Visit: Payer: Self-pay

## 2020-05-10 ENCOUNTER — Ambulatory Visit (INDEPENDENT_AMBULATORY_CARE_PROVIDER_SITE_OTHER): Payer: Commercial Managed Care - PPO | Admitting: Nurse Practitioner

## 2020-05-10 VITALS — BP 124/76 | HR 73 | Temp 98.3°F | Ht 68.11 in | Wt 176.2 lb

## 2020-05-10 DIAGNOSIS — E782 Mixed hyperlipidemia: Secondary | ICD-10-CM | POA: Diagnosis not present

## 2020-05-10 DIAGNOSIS — K219 Gastro-esophageal reflux disease without esophagitis: Secondary | ICD-10-CM

## 2020-05-10 DIAGNOSIS — R030 Elevated blood-pressure reading, without diagnosis of hypertension: Secondary | ICD-10-CM

## 2020-05-10 DIAGNOSIS — F419 Anxiety disorder, unspecified: Secondary | ICD-10-CM | POA: Diagnosis not present

## 2020-05-10 DIAGNOSIS — R7303 Prediabetes: Secondary | ICD-10-CM

## 2020-05-10 DIAGNOSIS — E559 Vitamin D deficiency, unspecified: Secondary | ICD-10-CM

## 2020-05-10 DIAGNOSIS — F324 Major depressive disorder, single episode, in partial remission: Secondary | ICD-10-CM | POA: Diagnosis not present

## 2020-05-10 MED ORDER — BUSPIRONE HCL 5 MG PO TABS: 5.0000 mg | ORAL_TABLET | Freq: Two times a day (BID) | ORAL | 4 refills | Status: DC

## 2020-05-10 NOTE — Patient Instructions (Signed)

## 2020-05-10 NOTE — Assessment & Plan Note (Signed)
Chronic, stable with daily Omeprazole.  Discussed with patient and at this time continue current dosing, but in future consider reduction and trial discontinuation -- if return symptoms then consider restart.  Today check Mag level.

## 2020-05-10 NOTE — Assessment & Plan Note (Signed)
Chronic, stable.  Continue daily supplement and check Vit D level today.

## 2020-05-10 NOTE — Assessment & Plan Note (Signed)
Chronic, ongoing.  Denies SI/HI, but does have some ongoing anxiety.  Continue Celexa 40 MG daily at this time, but made her aware at 1 will need to reduce to 20 MG due to QT prolongation risk.  Will add on Buspar 10 MG at bedtime and assess if benefit with this.  Return in 6 weeks for follow-up.

## 2020-05-10 NOTE — Assessment & Plan Note (Signed)
A1C continues at 6.3% today, continue focus on diet and exercise regimen.  CMP today.

## 2020-05-10 NOTE — Progress Notes (Signed)
BP 124/76 (BP Location: Left Arm)   Pulse 73   Temp 98.3 F (36.8 C) (Oral)   Ht 5' 8.11" (1.73 m)   Wt 176 lb 3.2 oz (79.9 kg)   LMP  (LMP Unknown)   SpO2 99%   BMI 26.70 kg/m    Subjective:    Patient ID: Suzanne Scott, female    DOB: December 20, 1960, 60 y.o.   MRN: [id number]  HPI: Suzanne Scott is a 60 y.o. female  Chief Complaint  Patient presents with  . Hyperlipidemia  . Mood  . Medication Refill    Patient pharmacy request refill on patient Citalopram prescription.    HYPERLIPIDEMIA Last LDL 108 in October. Continues on Atorvastatin 10 MG.  Saw cardiology, Dr. Fletcher Anon, 03/04/19 and is to follow-up as needed.  Had sleep study in January 2021 -- no OSA noted, to sleep at 30-45 degrees.   Had cataract surgery on 03/10/20 and still has some swelling, returns to see Dr. Wallace Going upcoming. Satisfied with current treatment? yes Duration of hyperlipidemia: chronic Cholesterol medication side effects: no Cholesterol supplements: none Medication compliance: good compliance Aspirin: no Recent stressors: no Recurrent headaches: no Visual changes: no Palpitations: no Dyspnea: no Chest pain: no Lower extremity edema: no Dizzy/lightheaded: no  The 10-year ASCVD risk score [name], et al., 2013) is: 5.1%   Values used to calculate the score:     Age: 23 years     Sex: Female     Is Non-Hispanic African American: Yes     Diabetic: No     Tobacco smoker: No     Systolic Blood Pressure: 322 mmHg     Is BP treated: No     HDL Cholesterol: 43 mg/dL     Total Cholesterol: 172 mg/dL  PREDIABETES A1C in October 6.3%. Polydipsia/polyuria: no Visual disturbance: no Chest pain: no Paresthesias: no   ANXIETY/STRESS Continues on Celexa 40 MG daily.  Does endorse some more anxiety at HS with occasional chest pressure.  Also endorses occasional hard time getting legs to relax -- is on Omeprazole for GERD and discussed with her this can cause lower Mag levels, which will  check today.  Is taking daily Vitamin D supplement for history of low levels.   Duration:stable Anxious mood: yes  Excessive worrying: no Irritability: no  Sweating: no Nausea: no Palpitations:occasional at night Hyperventilation: no Panic attacks: no Agoraphobia: no  Obscessions/compulsions: no Depressed mood: no Depression screen [facility] 2/9 05/10/2020 11/10/2019 06/13/2019 02/17/2019 10/02/2018  Decreased Interest 0 0 0 0 0  Down, Depressed, Hopeless 0 0 0 0 0  PHQ - 2 Score 0 0 0 0 0  Altered sleeping 0 0 3 0 -  Tired, decreased energy 0 0 1 0 -  Change in appetite 0 0 0 1 -  Feeling bad or failure about yourself  0 0 0 0 -  Trouble concentrating 0 0 3 3 -  Moving slowly or fidgety/restless 0 0 1 0 -  Suicidal thoughts 0 0 0 0 -  PHQ-9 Score 0 0 8 4 -  Difficult doing work/chores - Not difficult at all - - -  Some recent data might be hidden   Anhedonia: no Weight changes: no Insomnia: yes hard to fall asleep  Hypersomnia: no Fatigue/loss of energy: no Feelings of worthlessness: no Feelings of guilt: no Impaired concentration/indecisiveness: no Suicidal ideations: no  Crying spells: no Recent Stressors/Life Changes: no   Relationship problems: no   Family  stress: no     Financial stress: no    Job stress: no    Recent death/loss: no GAD 7 : Generalized Anxiety Score 05/10/2020 02/17/2019 08/21/2018 04/16/2018  Nervous, Anxious, on Edge 2 1 1  0  Control/stop worrying 0 1 1 1   Worry too much - different things 1 2 1 1   Trouble relaxing 0 2 3 1   Restless 0 2 1 1   Easily annoyed or irritable 0 2 1 1   Afraid - awful might happen 2 3 2  0  Total GAD 7 Score 5 13 10 5   Anxiety Difficulty Somewhat difficult Not difficult at all Not difficult at all Somewhat difficult     Relevant past medical, surgical, family and social history reviewed and updated as indicated. Interim medical history since our last visit reviewed. Allergies and medications reviewed and updated.  Review of  Systems  Constitutional: Negative for activity change, appetite change, diaphoresis, fatigue and fever.  Respiratory: Negative for cough, chest tightness, shortness of breath and wheezing.   Cardiovascular: Negative for chest pain, palpitations and leg swelling.  Gastrointestinal: Negative.   Endocrine: Negative.   Neurological: Negative.   Psychiatric/Behavioral: Negative for decreased concentration, self-injury, sleep disturbance and suicidal ideas. The patient is nervous/anxious.     Per HPI unless specifically indicated above     Objective:    BP 124/76 (BP Location: Left Arm)   Pulse 73   Temp 98.3 F (36.8 C) (Oral)   Ht 5' 8.11" (1.73 m)   Wt 176 lb 3.2 oz (79.9 kg)   LMP  (LMP Unknown)   SpO2 99%   BMI 26.70 kg/m   Wt Readings from Last 3 Encounters:  05/10/20 176 lb 3.2 oz (79.9 kg)  04/21/20 176 lb (79.8 kg)  04/13/20 174 lb (78.9 kg)    Physical Exam Vitals and nursing note reviewed.  Constitutional:      General: She is awake. She is not in acute distress.    Appearance: She is well-developed. She is not ill-appearing.  HENT:     Head: Normocephalic.     Right Ear: Hearing, tympanic membrane, ear canal and external ear normal. No drainage.     Left Ear: Hearing, tympanic membrane, ear canal and external ear normal. No drainage.  Eyes:     General: Lids are normal.        Right eye: No discharge.        Left eye: No discharge.     Conjunctiva/sclera: Conjunctivae normal.     Pupils: Pupils are equal, round, and reactive to light.  Neck:     Thyroid: No thyromegaly.     Vascular: No carotid bruit or JVD.  Cardiovascular:     Rate and Rhythm: Normal rate and regular rhythm.     Heart sounds: Normal heart sounds. No murmur heard. No gallop.   Pulmonary:     Effort: Pulmonary effort is normal. No accessory muscle usage or respiratory distress.     Breath sounds: Normal breath sounds.  Abdominal:     General: Bowel sounds are normal.     Palpations:  Abdomen is soft.  Musculoskeletal:     Cervical back: Normal range of motion and neck supple.     Right lower leg: No edema.     Left lower leg: No edema.  Lymphadenopathy:     Cervical: No cervical adenopathy.  Skin:    General: Skin is warm and dry.  Neurological:     Mental Status: She is alert  and oriented to person, place, and time.  Psychiatric:        Attention and Perception: Attention normal.        Mood and Affect: Mood normal.        Speech: Speech normal.        Behavior: Behavior normal. Behavior is cooperative.    Results for orders placed or performed during the hospital encounter of 03/08/20  SARS CORONAVIRUS 2 (TAT 6-24 HRS) Nasopharyngeal Nasopharyngeal Swab   Specimen: Nasopharyngeal Swab  Result Value Ref Range   SARS Coronavirus 2 NEGATIVE NEGATIVE      Assessment & Plan:   Problem List Items Addressed This Visit      Digestive   GERD (gastroesophageal reflux disease)    Chronic, stable with daily Omeprazole.  Discussed with patient and at this time continue current dosing, but in future consider reduction and trial discontinuation -- if return symptoms then consider restart.  Today check Mag level.      Relevant Orders   Magnesium     Other   Anxiety    Chronic, ongoing.  Denies SI/HI, but does have some ongoing anxiety.  Continue Celexa 40 MG daily at this time, but made her aware at 38 will need to reduce to 20 MG due to QT prolongation risk.  Will add on Buspar 10 MG at bedtime and assess if benefit with this.  Return in 6 weeks for follow-up.      Relevant Medications   busPIRone (BUSPAR) 5 MG tablet   Hyperlipidemia    Chronic, stable.  We will continue Atorvastatin 10 mg daily.  Lipid and CMP today.  Follow-up in 6 months.      Relevant Orders   Lipid Panel w/o Chol/HDL Ratio   Comprehensive metabolic panel   Depression - Primary    Chronic, ongoing.  Denies SI/HI, but does have some ongoing anxiety.  Continue Celexa 40 MG daily at this  time, but made her aware at 60 will need to reduce to 20 MG due to QT prolongation risk.  Will add on Buspar 10 MG at bedtime and assess if benefit with this.  Recommend for rest to also trial Melatonin and take Magnesium supplement to help legs relax.  Return in 6 weeks for follow-up.        Relevant Medications   busPIRone (BUSPAR) 5 MG tablet   Prediabetes    A1C continues at 6.3% today, continue focus on diet and exercise regimen.  CMP today.      Relevant Orders   Bayer DCA Hb A1c Waived   Microalbumin, Urine Waived   Vitamin D deficiency    Chronic, stable.  Continue daily supplement and check Vit D level today.      Relevant Orders   VITAMIN D 25 Hydroxy (Vit-D Deficiency, Fractures)   Elevated BP without diagnosis of hypertension    Ongoing with BP below goal today on recheck.  No current medications.  Continue focus on DASH diet and regular exercise.  Recommend she monitor BP at home daily and will continue collaboration with cardiology.  Initiate medication as needed -- would consider Losartan due to urine ALB 30 today.  CMP today.          Follow up plan: Return in about 6 weeks (around 06/21/2020) for Anxiety.

## 2020-05-10 NOTE — Assessment & Plan Note (Signed)
Chronic, stable.  We will continue Atorvastatin 10 mg daily.  Lipid and CMP today.  Follow-up in 6 months.

## 2020-05-10 NOTE — Assessment & Plan Note (Addendum)
Ongoing with BP below goal today on recheck.  No current medications.  Continue focus on DASH diet and regular exercise.  Recommend she monitor BP at home daily and will continue collaboration with cardiology.  Initiate medication as needed -- would consider Losartan due to urine ALB 30 today.  CMP today.

## 2020-05-10 NOTE — Assessment & Plan Note (Signed)
Chronic, ongoing.  Denies SI/HI, but does have some ongoing anxiety.  Continue Celexa 40 MG daily at this time, but made her aware at 60 will need to reduce to 20 MG due to QT prolongation risk.  Will add on Buspar 10 MG at bedtime and assess if benefit with this.  Recommend for rest to also trial Melatonin and take Magnesium supplement to help legs relax.  Return in 6 weeks for follow-up.

## 2020-05-11 LAB — LIPID PANEL W/O CHOL/HDL RATIO
Cholesterol, Total: 160 mg/dL (ref 100–199)
HDL: 45 mg/dL (ref >39)
LDL Chol Calc (NIH): 96 mg/dL (ref 0–99)
Triglycerides: 104 mg/dL (ref 0–149)
VLDL Cholesterol Cal: 19 mg/dL (ref 5–40)

## 2020-05-11 LAB — COMPREHENSIVE METABOLIC PANEL
ALT: 18 IU/L (ref 0–32)
AST: 19 IU/L (ref 0–40)
Albumin/Globulin Ratio: 1.8 (ref 1.2–2.2)
Albumin: 4.6 g/dL (ref 3.8–4.8)
Alkaline Phosphatase: 78 IU/L (ref 44–121)
BUN/Creatinine Ratio: 14 (ref 12–28)
BUN: 13 mg/dL (ref 8–27)
Bilirubin Total: 0.3 mg/dL (ref 0.0–1.2)
CO2: 24 mmol/L (ref 20–29)
Calcium: 9.4 mg/dL (ref 8.7–10.3)
Chloride: 102 mmol/L (ref 96–106)
Creatinine, Ser: 0.9 mg/dL (ref 0.57–1.00)
Globulin, Total: 2.5 g/dL (ref 1.5–4.5)
Glucose: 85 mg/dL (ref 65–99)
Potassium: 3.8 mmol/L (ref 3.5–5.2)
Sodium: 142 mmol/L (ref 134–144)
Total Protein: 7.1 g/dL (ref 6.0–8.5)
eGFR: 73 mL/min/{1.73_m2} (ref >59)

## 2020-05-11 LAB — MICROALBUMIN, URINE WAIVED
Creatinine, Urine Waived: 300 mg/dL (ref 10–300)
Microalb, Ur Waived: 30 mg/L — ABNORMAL HIGH (ref 0–19)
Microalb/Creat Ratio: <30 mg/g (ref <30)

## 2020-05-11 LAB — VITAMIN D 25 HYDROXY (VIT D DEFICIENCY, FRACTURES): Vit D, 25-Hydroxy: 32.2 ng/mL (ref 30.0–100.0)

## 2020-05-11 LAB — BAYER DCA HB A1C WAIVED: HB A1C (BAYER DCA - WAIVED): 6.3 % (ref <7.0)

## 2020-05-11 LAB — MAGNESIUM: Magnesium: 2.1 mg/dL (ref 1.6–2.3)

## 2020-05-11 NOTE — Progress Notes (Signed)
Contacted via MyChart   Good evening Suzanne Scott, your labs have returned: - Cholesterol levels are showing improvement with statin use.  LDL has trended down from 108 to 96.  Continue this regimen and we may adjust in future. - Kidney and liver function are nice and normal. - Magnesium and Vitamin D are normal. Any questions? Keep being awesome!!  Thank you for allowing me to participate in your care. Kindest regards, Josean Lycan

## 2020-05-17 ENCOUNTER — Other Ambulatory Visit: Payer: Self-pay

## 2020-05-17 MED ORDER — CITALOPRAM HYDROBROMIDE 40 MG PO TABS: 40.0000 mg | ORAL_TABLET | Freq: Every day | ORAL | 4 refills | Status: DC

## 2020-05-17 NOTE — Telephone Encounter (Signed)
Office received a refill request on Citalopram. Patient has an upcoming appointment scheduled on 06/22/20. Last seen in office on 05/10/20.

## 2020-06-22 ENCOUNTER — Other Ambulatory Visit: Payer: Self-pay

## 2020-06-22 ENCOUNTER — Ambulatory Visit (INDEPENDENT_AMBULATORY_CARE_PROVIDER_SITE_OTHER): Payer: Commercial Managed Care - PPO | Admitting: Nurse Practitioner

## 2020-06-22 ENCOUNTER — Encounter: Payer: Self-pay | Admitting: Nurse Practitioner

## 2020-06-22 VITALS — BP 116/69 | HR 64 | Temp 98.7°F | Wt 176.6 lb

## 2020-06-22 DIAGNOSIS — F419 Anxiety disorder, unspecified: Secondary | ICD-10-CM

## 2020-06-22 DIAGNOSIS — K219 Gastro-esophageal reflux disease without esophagitis: Secondary | ICD-10-CM | POA: Diagnosis not present

## 2020-06-22 DIAGNOSIS — F324 Major depressive disorder, single episode, in partial remission: Secondary | ICD-10-CM | POA: Diagnosis not present

## 2020-06-22 MED ORDER — OMEPRAZOLE 40 MG PO CPDR: DELAYED_RELEASE_CAPSULE | ORAL | 4 refills | Status: DC

## 2020-06-22 NOTE — Assessment & Plan Note (Signed)
Chronic, ongoing.  Denies SI/HI, but does have some ongoing anxiety.  Continue Celexa 40 MG daily at this time, but made her aware at 87 will need to reduce to 20 MG due to QT prolongation risk.  Will continue Buspar 10 MG at bedtime, as offering benefit.  Return in October for annual physical.

## 2020-06-22 NOTE — Progress Notes (Signed)
BP 116/69   Pulse 64   Temp 98.7 F (37.1 C) (Oral)   Wt 176 lb 9.6 oz (80.1 kg)   LMP  (LMP Unknown)   SpO2 96%   BMI 26.77 kg/m    Subjective:    Patient ID: Suzanne Scott, female    DOB: [birth year], 60 y.o.   MRN: [id number]  HPI: Suzanne Scott is a 60 y.o. female  Chief Complaint  Patient presents with  . Anxiety    Patient is here for a six week follow up for anxiety and states it has gotten better since her last visit.   . Chest Pain    Patient states most times she eats before laying down and after she has eaten and go to lie down she notices some chest pain (pressure). Patient states it is not constant, but it comes and goes. Patient only notices it at night time.     GERD Is taking Omeprazole 40 MG daily -- has not used in awhile, probably over a month.  Continues to have chest pressure after she eats and lies down, have discussed need to wait before lying down.  Only notices this discomfort at night.  She often eats and then lies down 1/2 hours later, does not lie flat -- tries to keep elevated.   GERD control status: uncontrolled  Satisfied with current treatment? yes Heartburn frequency: a few days over week Medication side effects: no  Medication compliance: fluctuating Previous GERD medications: none Antacid use frequency: none used Duration: 30 minute to one hour Nature: pressure in chest Location: sternal area Heartburn duration: 30 min to one hour Alleviatiating factors: goes away on own after awhile Aggravating factors: after eating at night Dysphagia: no Odynophagia:  no Hematemesis: no Blood in stool: no EGD: no  ANXIETY/STRESS Continues on Celexa 40 MG daily + added Buspar 5 MG BID.   Buspar added last visit and feels some improvement.   Duration:stable Anxious mood: yes  Excessive worrying: no Irritability: no  Sweating: no Nausea: no Palpitations: improved Hyperventilation: no Panic attacks: no Agoraphobia: no   Obscessions/compulsions: no Depressed mood: no Depression screen [facility] 2/9 05/10/2020 11/10/2019 06/13/2019 02/17/2019 10/02/2018  Decreased Interest 0 0 0 0 0  Down, Depressed, Hopeless 0 0 0 0 0  PHQ - 2 Score 0 0 0 0 0  Altered sleeping 0 0 3 0 -  Tired, decreased energy 0 0 1 0 -  Change in appetite 0 0 0 1 -  Feeling bad or failure about yourself  0 0 0 0 -  Trouble concentrating 0 0 3 3 -  Moving slowly or fidgety/restless 0 0 1 0 -  Suicidal thoughts 0 0 0 0 -  PHQ-9 Score 0 0 8 4 -  Difficult doing work/chores - Not difficult at all - - -  Some recent data might be hidden   Anhedonia: no Weight changes: no Insomnia: yes hard to fall asleep  Hypersomnia: no Fatigue/loss of energy: no Feelings of worthlessness: no Feelings of guilt: no Impaired concentration/indecisiveness: no Suicidal ideations: no  Crying spells: no Recent Stressors/Life Changes: no   Relationship problems: no   Family stress: no     Financial stress: no    Job stress: no    Recent death/loss: no GAD 7 : Generalized Anxiety Score 06/22/2020 06/22/2020 05/10/2020 02/17/2019  Nervous, Anxious, on Edge 0 0 2 1  Control/stop worrying 1 0 0 1  Worry too much - different things  _0 Trouble relaxing 2 2 0 2  Restless 2 2 0 2  Easily annoyed or irritable 0 - 0 2  Afraid - awful might happen 0 - 2 3  Total GAD 7 Score 7 - 5 13  Anxiety Difficulty - - Somewhat difficult Not difficult at all   Relevant past medical, surgical, family and social history reviewed and updated as indicated. Interim medical history since our last visit reviewed. Allergies and medications reviewed and updated.  Review of Systems  Constitutional: Negative for activity change, appetite change, diaphoresis, fatigue and fever.  Respiratory: Negative for cough, chest tightness, shortness of breath and wheezing.   Cardiovascular: Negative for chest pain (pressure only at night after eating), palpitations and leg swelling.   Gastrointestinal: Negative.   Endocrine: Negative.   Neurological: Negative.   Psychiatric/Behavioral: Negative for decreased concentration, self-injury, sleep disturbance and suicidal ideas. The patient is nervous/anxious.     Per HPI unless specifically indicated above     Objective:    BP 116/69   Pulse 64   Temp 98.7 F (37.1 C) (Oral)   Wt 176 lb 9.6 oz (80.1 kg)   LMP  (LMP Unknown)   SpO2 96%   BMI 26.77 kg/m   Wt Readings from Last 3 Encounters:  06/22/20 176 lb 9.6 oz (80.1 kg)  05/10/20 176 lb 3.2 oz (79.9 kg)  04/21/20 176 lb (79.8 kg)    Physical Exam Vitals and nursing note reviewed.  Constitutional:      General: She is awake. She is not in acute distress.    Appearance: She is well-developed. She is not ill-appearing.  HENT:     Head: Normocephalic.     Right Ear: Hearing, tympanic membrane, ear canal and external ear normal. No drainage.     Left Ear: Hearing, tympanic membrane, ear canal and external ear normal. No drainage.  Eyes:     General: Lids are normal.        Right eye: No discharge.        Left eye: No discharge.     Conjunctiva/sclera: Conjunctivae normal.     Pupils: Pupils are equal, round, and reactive to light.  Neck:     Thyroid: No thyromegaly.     Vascular: No carotid bruit or JVD.  Cardiovascular:     Rate and Rhythm: Normal rate and regular rhythm.     Heart sounds: Normal heart sounds. No murmur heard. No gallop.   Pulmonary:     Effort: Pulmonary effort is normal. No accessory muscle usage or respiratory distress.     Breath sounds: Normal breath sounds.  Abdominal:     General: Bowel sounds are normal.     Palpations: Abdomen is soft.  Musculoskeletal:     Cervical back: Normal range of motion and neck supple.     Right lower leg: No edema.     Left lower leg: No edema.  Lymphadenopathy:     Cervical: No cervical adenopathy.  Skin:    General: Skin is warm and dry.  Neurological:     Mental Status: She is alert  and oriented to person, place, and time.  Psychiatric:        Attention and Perception: Attention normal.        Mood and Affect: Mood normal.        Speech: Speech normal.        Behavior: Behavior normal. Behavior is cooperative.    Results for orders placed  or performed in visit on 05/10/20  Bayer DCA Hb A1c Waived  Result Value Ref Range   HB A1C (BAYER DCA - WAIVED) 6.3 <7.0 %  Microalbumin, Urine Waived  Result Value Ref Range   Microalb, Ur Waived 30 (H) 0 - 19 mg/L   Creatinine, Urine Waived 300 10 - 300 mg/dL   Microalb/Creat Ratio <30 <30 mg/g  Lipid Panel w/o Chol/HDL Ratio  Result Value Ref Range   Cholesterol, Total 160 100 - 199 mg/dL   Triglycerides 104 0 - 149 mg/dL   HDL 45 >39 mg/dL   VLDL Cholesterol Cal 19 5 - 40 mg/dL   LDL Chol Calc (NIH) 96 0 - 99 mg/dL  Comprehensive metabolic panel  Result Value Ref Range   Glucose 85 65 - 99 mg/dL   BUN 13 8 - 27 mg/dL   Creatinine, Ser 0.90 0.57 - 1.00 mg/dL   eGFR 73 >59 mL/min/1.73   BUN/Creatinine Ratio 14 12 - 28   Sodium 142 134 - 144 mmol/L   Potassium 3.8 3.5 - 5.2 mmol/L   Chloride 102 96 - 106 mmol/L   CO2 24 20 - 29 mmol/L   Calcium 9.4 8.7 - 10.3 mg/dL   Total Protein 7.1 6.0 - 8.5 g/dL   Albumin 4.6 3.8 - 4.8 g/dL   Globulin, Total 2.5 1.5 - 4.5 g/dL   Albumin/Globulin Ratio 1.8 1.2 - 2.2   Bilirubin Total 0.3 0.0 - 1.2 mg/dL   Alkaline Phosphatase 78 44 - 121 IU/L   AST 19 0 - 40 IU/L   ALT 18 0 - 32 IU/L  Magnesium  Result Value Ref Range   Magnesium 2.1 1.6 - 2.3 mg/dL  VITAMIN D 25 Hydroxy (Vit-D Deficiency, Fractures)  Result Value Ref Range   Vit D, 25-Hydroxy 32.2 30.0 - 100.0 ng/mL      Assessment & Plan:   Problem List Items Addressed This Visit      Digestive   GERD (gastroesophageal reflux disease)    Chronic with worsening HS symptoms.  Discussed with patient to restart Omeprazole, will send in refills.  Recommend she trial taking it in morning and if not benefit for night  time symptoms, can take later in day.  Recent Mag level stable.  Recommend she ensure not to lie down for 2 hours after eating, as can irritate symptoms.  Return in October for physical, sooner if worsening.      Relevant Medications   omeprazole (PRILOSEC) 40 MG capsule     Other   Anxiety    Chronic, ongoing.  Denies SI/HI, but does have some ongoing anxiety.  Continue Celexa 40 MG daily at this time, but made her aware at 51 will need to reduce to 20 MG due to QT prolongation risk.  Will continue Buspar 10 MG at bedtime, as offering benefit.  Return in October for annual physical.      Depression - Primary    Chronic, ongoing.  Denies SI/HI.  Continue Celexa 40 MG daily at this time, but made her aware at 80 will need to reduce to 20 MG due to QT prolongation risk + continue Buspar 10 MG at bedtime.  Recommend for rest to also trial Melatonin and take Magnesium supplement to help legs relax.  Return in October for physical.            Follow up plan: Return in about 5 months (around 11/10/2020) for Annual physical.

## 2020-06-22 NOTE — Assessment & Plan Note (Signed)
Chronic, ongoing.  Denies SI/HI.  Continue Celexa 40 MG daily at this time, but made her aware at 69 will need to reduce to 20 MG due to QT prolongation risk + continue Buspar 10 MG at bedtime.  Recommend for rest to also trial Melatonin and take Magnesium supplement to help legs relax.  Return in October for physical.

## 2020-06-22 NOTE — Assessment & Plan Note (Signed)
Chronic with worsening HS symptoms.  Discussed with patient to restart Omeprazole, will send in refills.  Recommend she trial taking it in morning and if not benefit for night time symptoms, can take later in day.  Recent Mag level stable.  Recommend she ensure not to lie down for 2 hours after eating, as can irritate symptoms.  Return in October for physical, sooner if worsening.

## 2020-06-22 NOTE — Patient Instructions (Signed)
Food Choices for Gastroesophageal Reflux Disease, Adult When you have gastroesophageal reflux disease (GERD), the foods you eat and your eating habits are very important. Choosing the right foods can help ease your discomfort. Think about working with a food expert (dietitian) to help you make good choices. What are tips for following this plan? Reading food labels  Look for foods that are low in saturated fat. Foods that may help with your symptoms include: ? Foods that have less than 5% of daily value (DV) of fat. ? Foods that have 0 grams of trans fat. Cooking  Do not fry your food.  Cook your food by baking, steaming, grilling, or broiling. These are all methods that do not need a lot of fat for cooking.  To add flavor, try to use herbs that are low in spice and acidity. Meal planning  Choose healthy foods that are low in fat, such as: ? Fruits and vegetables. ? Whole grains. ? Low-fat dairy products. ? Lean meats, fish, and poultry.  Eat small meals often instead of eating 3 large meals each day. Eat your meals slowly in a place where you are relaxed. Avoid bending over or lying down until 2-3 hours after eating.  Limit high-fat foods such as fatty meats or fried foods.  Limit your intake of fatty foods, such as oils, butter, and shortening.  Avoid the following as told by your doctor: ? Foods that cause symptoms. These may be different for different people. Keep a food diary to keep track of foods that cause symptoms. ? Alcohol. ? Drinking a lot of liquid with meals. ? Eating meals during the 2-3 hours before bed.   Lifestyle  Stay at a healthy weight. Ask your doctor what weight is healthy for you. If you need to lose weight, work with your doctor to do so safely.  Exercise for at least 30 minutes on 5 or more days each week, or as told by your doctor.  Wear loose-fitting clothes.  Do not smoke or use any products that contain nicotine or tobacco. If you need help  quitting, ask your doctor.  Sleep with the head of your bed higher than your feet. Use a wedge under the mattress or blocks under the bed frame to raise the head of the bed.  Chew sugar-free gum after meals. What foods should eat? Eat a healthy, well-balanced diet of fruits, vegetables, whole grains, low-fat dairy products, lean meats, fish, and poultry. Each person is different. Foods that may cause symptoms in one person may not cause any symptoms in another person. Work with your doctor to find foods that are safe for you. The items listed above may not be a complete list of what you can eat and drink. Contact a food expert for more options.   What foods should I avoid? Limiting some of these foods may help in managing the symptoms of GERD. Everyone is different. Talk with a food expert or your doctor to help you find the exact foods to avoid, if any. Fruits Any fruits prepared with added fat. Any fruits that cause symptoms. For some people, this may include citrus fruits, such as oranges, grapefruit, pineapple, and lemons. Vegetables Deep-fried vegetables. French fries. Any vegetables prepared with added fat. Any vegetables that cause symptoms. For some people, this may include tomatoes and tomato products, chili peppers, onions and garlic, and horseradish. Grains Pastries or quick breads with added fat. Meats and other proteins High-fat meats, such as fatty beef or pork,   hot dogs, ribs, ham, sausage, salami, and bacon. Fried meat or protein, including fried fish and fried chicken. Nuts and nut butters, in large amounts. Dairy Whole milk and chocolate milk. Sour cream. Cream. Ice cream. Cream cheese. Milkshakes. Fats and oils Butter. Margarine. Shortening. Ghee. Beverages Coffee and tea, with or without caffeine. Carbonated beverages. Sodas. Energy drinks. Fruit juice made with acidic fruits, such as orange or grapefruit. Tomato juice. Alcoholic drinks. Sweets and desserts Chocolate and  cocoa. Donuts. Seasonings and condiments Pepper. Peppermint and spearmint. Added salt. Any condiments, herbs, or seasonings that cause symptoms. For some people, this may include curry, hot sauce, or vinegar-based salad dressings. The items listed above may not be a complete list of what you should not eat and drink. Contact a food expert for more options. Questions to ask your doctor Diet and lifestyle changes are often the first steps that are taken to manage symptoms of GERD. If diet and lifestyle changes do not help, talk with your doctor about taking medicines. Where to find more information  [employer] G[employer]intestinal [employer]: aboutgerd.org Summary  When you have GERD, food and lifestyle choices are very important in easing your symptoms.  Eat small meals often instead of 3 large meals a day. Eat your meals slowly and in a place where you are relaxed.  Avoid bending over or lying down until 2-3 hours after eating.  Limit high-fat foods such as fatty meats or fried foods. This information is not intended to replace advice given to you by your health care provider. Make sure you discuss any questions you have with your health care provider. Document Revised: 07/28/2019 Document Reviewed: 07/28/2019 Elsevier Patient Education  2021 [employer]  

## 2020-08-18 ENCOUNTER — Ambulatory Visit: Payer: Self-pay | Admitting: *Deleted

## 2020-08-18 NOTE — Telephone Encounter (Signed)
Scheduled 7/22

## 2020-08-18 NOTE — Telephone Encounter (Signed)
Patient reports she has chest pain off/on. Patient states she had sharp pain in chest that woke her. Patient states she has the pain when getting up from chair. Patient does not have the pain at work- only when at home. Reason for Disposition  [1] Chest pain(s) lasting a few seconds AND [2] persists > 3 days  Answer Assessment - Initial Assessment Questions 1. LOCATION: "Where does it hurt?"       Left side above the breast 2. RADIATION: "Does the pain go anywhere else?" (e.g., into neck, jaw, arms, back)     Back-same side 3. ONSET: "When did the chest pain begin?" (Minutes, hours or days)      Months- patient was tested and cleared of cardiac problems 4. PATTERN "Does the pain come and go, or has it been constant since it started?"  "Does it get worse with exertion?"      Goes/goes, lasts- not worse when moving 5. DURATION: "How long does it last" (e.g., seconds, minutes, hours)     seconds 6. SEVERITY: "How bad is the pain?"  (e.g., Scale 1-10; mild, moderate, or severe)    - MILD (1-3): doesn't interfere with normal activities     - MODERATE (4-7): interferes with normal activities or awakens from sleep    - SEVERE (8-10): excruciating pain, unable to do any normal activities       moderate 7. CARDIAC RISK FACTORS: "Do you have any history of heart problems or risk factors for heart disease?" (e.g., angina, prior heart attack; diabetes, high blood pressure, high cholesterol, smoker, or strong family history of heart disease)     Patient was seen last year by cardiologist -no health conditions 8. PULMONARY RISK FACTORS: "Do you have any history of lung disease?"  (e.g., blood clots in lung, asthma, emphysema, birth control pills)     no 9. CAUSE: "What do you think is causing the chest pain?"     unsure 10. OTHER SYMPTOMS: "Do you have any other symptoms?" (e.g., dizziness, nausea, vomiting, sweating, fever, difficulty breathing, cough)       no 11. PREGNANCY: "Is there any chance you  are pregnant?" "When was your last menstrual period?"       N/a  Protocols used: Chest Pain-A-AH

## 2020-08-18 NOTE — Telephone Encounter (Signed)
Called pt to relay Jolene's message no answer left vm  

## 2020-08-18 NOTE — Telephone Encounter (Signed)
Patient is calling to report she is still having the chest pain she was evaluated for this last year. Patient reports the pain is off/on- occurring after work- she gets the pain when she goes from sitting to standing and last night it woke her. Patient states it only last seconds and does not get worse with exertion. Patient reports she is having no other symptoms with the pain. Appointment scheduled per protocol and patient advised to be seen sooner if she develops any additional symptoms.

## 2020-08-19 NOTE — Telephone Encounter (Signed)
Pt called in this morning verbalized understanding did not want to go to the er will wait until tomorrow's appt

## 2020-08-20 ENCOUNTER — Ambulatory Visit (INDEPENDENT_AMBULATORY_CARE_PROVIDER_SITE_OTHER): Payer: Commercial Managed Care - PPO | Admitting: Nurse Practitioner

## 2020-08-20 ENCOUNTER — Encounter: Payer: Self-pay | Admitting: Nurse Practitioner

## 2020-08-20 ENCOUNTER — Other Ambulatory Visit: Payer: Self-pay

## 2020-08-20 VITALS — BP 107/64 | HR 64 | Temp 98.7°F | Wt 176.0 lb

## 2020-08-20 DIAGNOSIS — K219 Gastro-esophageal reflux disease without esophagitis: Secondary | ICD-10-CM | POA: Diagnosis not present

## 2020-08-20 DIAGNOSIS — R079 Chest pain, unspecified: Secondary | ICD-10-CM

## 2020-08-20 MED ORDER — TIZANIDINE HCL 4 MG PO TABS: 4.0000 mg | ORAL_TABLET | Freq: Four times a day (QID) | ORAL | 0 refills | Status: DC | PRN

## 2020-08-20 MED ORDER — DICYCLOMINE HCL 10 MG PO CAPS: 10.0000 mg | ORAL_CAPSULE | Freq: Two times a day (BID) | ORAL | 4 refills | Status: DC

## 2020-08-20 MED ORDER — OMEPRAZOLE 20 MG PO CPDR: 20.0000 mg | DELAYED_RELEASE_CAPSULE | Freq: Two times a day (BID) | ORAL | 4 refills | Status: DC

## 2020-08-20 NOTE — Assessment & Plan Note (Signed)
Suspect more related to GERD and muscular from lifting at work.  EKG reassuring and cardiac work-up last year reassuring.  Discussed with her.  Refer to GERD plan.  Will send in Tizanidine to take as needed for muscle discomfort.  Return in 2 weeks.

## 2020-08-20 NOTE — Progress Notes (Signed)
BP 107/64   Pulse 64   Temp 98.7 F (37.1 C) (Oral)   Wt 176 lb (79.8 kg)   LMP  (LMP Unknown)   SpO2 98%   BMI 26.67 kg/m    Subjective:    Patient ID: Suzanne Scott, female    DOB: 05/06/1960, 60 y.o.   MRN: [id number]  HPI: Suzanne Scott is a 60 y.o. female  Chief Complaint  Patient presents with   Chest Pain    Patient states she is still experiencing chest pain and thinks it may be gas because she is eating before she lays down and states she usually drinks some thing "hot to drink" before she lays down. Patient states the discomfort only comes at night. Patient states had never tried any anti-acids. Patient states she noticed it more the last two weeks. Patient states it happens more when she is getting up. Patient denies having pain just discomfort. Patient states she has not had pain since scheduling appoi   CHEST PAIN Only happens in the afternoon, after coming home from work.  Notices when she sits down and unwinds, when gets in bed and turns around notices it.  Eats fruit when comes in house.  Taking Prilosec daily.  Last saw cardiology for similar 03/04/19.  CT cardiac -- coronary calcium score was 0, recommended to check for "non-atherosclerotic causes of chest pain". She does endorse eating less the 2-3 hours before bed and taking snacks to bed.  Current pain is dull and aching sensation from chest area and around to back of left shoulder.  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  Pain is 8/10 when presents, lasts approx 30 min to hours.  Denies N&V, diaphoresis, slurred speech, headaches, neck pain, fever.  She does endorse frequent belching and flatulence. Satisfied with current treatment? yes Frequency: every day after work Dysphagia: no Odynophagia:  no Hematemesis: no Blood in stool: no EGD: yes -- 10/05/2017 was normal   Relevant past medical, surgical, family and social history reviewed and updated as  indicated. Interim medical history since our last visit reviewed. Allergies and medications reviewed and updated.  Review of Systems  Constitutional:  Negative for activity change, appetite change, diaphoresis, fatigue and fever.  Respiratory:  Negative for cough, chest tightness, shortness of breath and wheezing.   Cardiovascular:  Positive for chest pain (pressure only at night after eating). Negative for palpitations and leg swelling.  Gastrointestinal: Negative.   Endocrine: Negative.   Neurological: Negative.   Psychiatric/Behavioral:  Negative for decreased concentration, self-injury, sleep disturbance and suicidal ideas. The patient is not nervous/anxious.    Per HPI unless specifically indicated above     Objective:    BP 107/64   Pulse 64   Temp 98.7 F (37.1 C) (Oral)   Wt 176 lb (79.8 kg)   LMP  (LMP Unknown)   SpO2 98%   BMI 26.67 kg/m   Wt Readings from Last 3 Encounters:  08/20/20 176 lb (79.8 kg)  06/22/20 176 lb 9.6 oz (80.1 kg)  05/10/20 176 lb 3.2 oz (79.9 kg)    Physical Exam Vitals and nursing note reviewed.  Constitutional:      General: She is awake. She is not in acute distress.    Appearance: She is well-developed. She is not ill-appearing.  HENT:     Head: Normocephalic.     Right Ear: Hearing and external ear normal. No  drainage.     Left Ear: Hearing and external ear normal. No drainage.  Eyes:     General: Lids are normal.        Right eye: No discharge.        Left eye: No discharge.     Conjunctiva/sclera: Conjunctivae normal.     Pupils: Pupils are equal, round, and reactive to light.  Neck:     Thyroid: No thyromegaly.     Vascular: No carotid bruit or JVD.  Cardiovascular:     Rate and Rhythm: Normal rate and regular rhythm.     Heart sounds: Normal heart sounds. No murmur heard.   No gallop.  Pulmonary:     Effort: Pulmonary effort is normal. No accessory muscle usage or respiratory distress.     Breath sounds: Normal breath  sounds.  Abdominal:     General: Bowel sounds are normal.     Palpations: Abdomen is soft.  Musculoskeletal:     Cervical back: Normal range of motion and neck supple.     Right lower leg: No edema.     Left lower leg: No edema.  Lymphadenopathy:     Cervical: No cervical adenopathy.  Skin:    General: Skin is warm and dry.  Neurological:     Mental Status: She is alert and oriented to person, place, and time.  Psychiatric:        Attention and Perception: Attention normal.        Mood and Affect: Mood normal.        Speech: Speech normal.        Behavior: Behavior normal. Behavior is cooperative.   EKG My review and personal interpretation at Time: 1420  Indication: chest pain  Rate: 60  Rhythm: sinus Axis: normal Other: no nonspecific st abn, no stemi, no lvh   Results for orders placed or performed in visit on 05/10/20  Bayer DCA Hb A1c Waived  Result Value Ref Range   HB A1C (BAYER DCA - WAIVED) 6.3 <7.0 %  Microalbumin, Urine Waived  Result Value Ref Range   Microalb, Ur Waived 30 (H) 0 - 19 mg/L   Creatinine, Urine Waived 300 10 - 300 mg/dL   Microalb/Creat Ratio <30 <30 mg/g  Lipid Panel w/o Chol/HDL Ratio  Result Value Ref Range   Cholesterol, Total 160 100 - 199 mg/dL   Triglycerides 104 0 - 149 mg/dL   HDL 45 >39 mg/dL   VLDL Cholesterol Cal 19 5 - 40 mg/dL   LDL Chol Calc (NIH) 96 0 - 99 mg/dL  Comprehensive metabolic panel  Result Value Ref Range   Glucose 85 65 - 99 mg/dL   BUN 13 8 - 27 mg/dL   Creatinine, Ser 0.90 0.57 - 1.00 mg/dL   eGFR 73 >59 mL/min/1.73   BUN/Creatinine Ratio 14 12 - 28   Sodium 142 134 - 144 mmol/L   Potassium 3.8 3.5 - 5.2 mmol/L   Chloride 102 96 - 106 mmol/L   CO2 24 20 - 29 mmol/L   Calcium 9.4 8.7 - 10.3 mg/dL   Total Protein 7.1 6.0 - 8.5 g/dL   Albumin 4.6 3.8 - 4.8 g/dL   Globulin, Total 2.5 1.5 - 4.5 g/dL   Albumin/Globulin Ratio 1.8 1.2 - 2.2   Bilirubin Total 0.3 0.0 - 1.2 mg/dL   Alkaline Phosphatase 78 44 -  121 IU/L   AST 19 0 - 40 IU/L   ALT 18 0 - 32 IU/L  Magnesium  Result Value Ref Range   Magnesium 2.1 1.6 - 2.3 mg/dL  VITAMIN D 25 Hydroxy (Vit-D Deficiency, Fractures)  Result Value Ref Range   Vit D, 25-Hydroxy 32.2 30.0 - 100.0 ng/mL      Assessment & Plan:   Problem List Items Addressed This Visit       Digestive   GERD (gastroesophageal reflux disease)    Chronic with worsening HS symptoms.  Discussed with patient no eating 2-3 hours before bed and no taking snacks to bed with her.  Change Prilosec to 20 MG BID.  Trial of Bentyl sent for gas discomfort.  Recent Mag level stable.  Recommend she ensure not to lie down for 2-3 hours after eating, as can irritate symptoms.  Return in 2 weeks.       Relevant Medications   dicyclomine (BENTYL) 10 MG capsule   omeprazole (PRILOSEC) 20 MG capsule     Other   Chest pain - Primary    Suspect more related to GERD and muscular from lifting at work.  EKG reassuring and cardiac work-up last year reassuring.  Discussed with her.  Refer to GERD plan.  Will send in Tizanidine to take as needed for muscle discomfort.  Return in 2 weeks.       Relevant Orders   EKG 12-Lead (Completed)     Follow up plan: Return in about 2 weeks (around 09/03/2020) for GERD.

## 2020-08-20 NOTE — Assessment & Plan Note (Signed)
Chronic with worsening HS symptoms.  Discussed with patient no eating 2-3 hours before bed and no taking snacks to bed with her.  Change Prilosec to 20 MG BID.  Trial of Bentyl sent for gas discomfort.  Recent Mag level stable.  Recommend she ensure not to lie down for 2-3 hours after eating, as can irritate symptoms.  Return in 2 weeks.

## 2020-08-20 NOTE — Patient Instructions (Signed)

## 2020-08-23 ENCOUNTER — Other Ambulatory Visit: Payer: Self-pay

## 2020-08-23 ENCOUNTER — Telehealth: Payer: Self-pay

## 2020-08-23 ENCOUNTER — Other Ambulatory Visit: Payer: Self-pay | Admitting: Nurse Practitioner

## 2020-08-23 MED ORDER — OMEPRAZOLE 40 MG PO CPDR: 40.0000 mg | DELAYED_RELEASE_CAPSULE | Freq: Every day | ORAL | 4 refills | Status: DC

## 2020-08-23 NOTE — Telephone Encounter (Signed)
Pharmacy sent a fax regarding patients Omeprazole RX. The 20 mg BID is not covered, but a once daily RX would be. Could the patient be changed to 1 40 mg tablet daily?

## 2020-08-23 NOTE — Telephone Encounter (Signed)
Entered in error

## 2020-09-06 ENCOUNTER — Telehealth: Payer: Self-pay | Admitting: Podiatry

## 2020-09-06 NOTE — Telephone Encounter (Signed)
Pt called and lvm stating that she needed Dr. Amalia Hailey to give her a call.

## 2020-09-07 NOTE — Telephone Encounter (Signed)
Called but no answer. I haven't seen this patient in over 3 years. She can make an appt if she calls back. - Dr. Amalia Hailey

## 2020-09-14 ENCOUNTER — Ambulatory Visit (INDEPENDENT_AMBULATORY_CARE_PROVIDER_SITE_OTHER): Payer: Commercial Managed Care - PPO

## 2020-09-14 ENCOUNTER — Other Ambulatory Visit: Payer: Self-pay

## 2020-09-14 ENCOUNTER — Ambulatory Visit (INDEPENDENT_AMBULATORY_CARE_PROVIDER_SITE_OTHER): Payer: Commercial Managed Care - PPO | Admitting: Podiatry

## 2020-09-14 DIAGNOSIS — M722 Plantar fascial fibromatosis: Secondary | ICD-10-CM

## 2020-09-14 MED ORDER — BETAMETHASONE SOD PHOS & ACET 6 (3-3) MG/ML IJ SUSP
3.0000 mg | Freq: Once | INTRAMUSCULAR | Status: AC
  Administered 2020-09-14: 3 mg via INTRA_ARTICULAR

## 2020-09-14 MED ORDER — MELOXICAM 15 MG PO TABS: 15.0000 mg | ORAL_TABLET | Freq: Every day | ORAL | 0 refills | Status: DC

## 2020-09-14 NOTE — Patient Instructions (Signed)

## 2020-09-14 NOTE — Progress Notes (Signed)
[place]

## 2020-09-14 NOTE — Progress Notes (Signed)
   Subjective: 60 y.o. female presenting to the office today for evaluation of left heel pain this been going on for several weeks.  Patient has been very busy at work and she stands for several hours throughout the day.  She has been experiencing heel pain.  She wears her custom orthotics with minimal improvement.  She denies a history of injury.  She presents for further treatment and evaluation   Past Medical History:  Diagnosis Date   Anxiety    Atrophic vaginitis    Hyperlipidemia      Objective: Physical Exam General: The patient is alert and oriented x3 in no acute distress.  Dermatology: Skin is warm, dry and supple bilateral lower extremities. Negative for open lesions or macerations bilateral.   Vascular: Dorsalis Pedis and Posterior Tibial pulses palpable bilateral.  Capillary fill time is immediate to all digits.  Neurological: Epicritic and protective threshold intact bilateral.   Musculoskeletal: Tenderness to palpation to the plantar aspect of the left heel along the plantar fascia. All other joints range of motion within normal limits bilateral. Strength 5/5 in all groups bilateral.   Radiographic exam: Normal osseous mineralization. Joint spaces preserved. No fracture/dislocation/boney destruction. No other soft tissue abnormalities or radiopaque foreign bodies.   Assessment: 1. Plantar fasciitis left foot  Plan of Care:  1. Patient evaluated. Xrays reviewed.   2. Injection of 0.5cc Celestone soluspan injected into the left plantar fascia.  3.  Continue custom molded orthotics that the patient received at another podiatry practice 4. Rx for Meloxicam ordered for patient. 5. Plantar fascial band(s) dispensed  6. Instructed patient regarding therapies and modalities at home to alleviate symptoms.  7. Return to clinic in 4 weeks.    *Works at [facility] [employer] [facility] [employer]   Edrick Kins, DPM [facility]  Dr. Edrick Kins, DPM    [address], [place] [address]                Office [phone]  Fax [phone]

## 2020-09-17 ENCOUNTER — Ambulatory Visit (INDEPENDENT_AMBULATORY_CARE_PROVIDER_SITE_OTHER): Payer: Commercial Managed Care - PPO | Admitting: Nurse Practitioner

## 2020-09-17 ENCOUNTER — Other Ambulatory Visit: Payer: Self-pay

## 2020-09-17 ENCOUNTER — Encounter: Payer: Self-pay | Admitting: Nurse Practitioner

## 2020-09-17 VITALS — BP 117/68 | HR 60 | Temp 98.6°F | Wt 176.0 lb

## 2020-09-17 DIAGNOSIS — Z111 Encounter for screening for respiratory tuberculosis: Secondary | ICD-10-CM

## 2020-09-17 DIAGNOSIS — K219 Gastro-esophageal reflux disease without esophagitis: Secondary | ICD-10-CM

## 2020-09-17 NOTE — Assessment & Plan Note (Signed)
Chronic and improving.  Discussed with patient no eating 2-3 hours before bed and no taking snacks to bed with her.  Continue Prilosec 40 MG daily and Bentyl BID for gas discomfort as is offering benefit.  Recent Mag level stable.  Recommend she ensure not to lie down for 2-3 hours after eating, as can irritate symptoms.  Return as scheduled in October.  Discussed long term use of PPI and recommend trial reductions in future.

## 2020-09-17 NOTE — Patient Instructions (Signed)

## 2020-09-17 NOTE — Progress Notes (Signed)
BP 117/68   Pulse 60   Temp 98.6 F (37 C) (Oral)   Wt 176 lb (79.8 kg)   LMP  (LMP Unknown)   SpO2 97%   BMI 26.67 kg/m    Subjective:    Patient ID: Suzanne Scott, female    DOB: Jun 09, 1960, 60 y.o.   MRN: [id number]  HPI: Suzanne Scott is a 60 y.o. female  Chief Complaint  Patient presents with   Chest Pain    Patient states she is doing better since she takes the Dicyclomine twice a day with lunch and dinner and it helps a lot and she has cut back on her on the spicy food. Patient is requesting a TB blood test at today's visit for her job.    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  GERD Added on Bentyl last visit, which she reports offered benefit and is having less gas.  Continues on Omeprazole and is now taking every day. GERD control status: stable Satisfied with current treatment? yes Heartburn frequency: none Medication side effects: no  Medication compliance: better Previous GERD medications: as above Antacid use frequency:  none Alleviatiating factors: current medications Aggravating factors: spicy foods Dysphagia: no Odynophagia:  no Hematemesis: no Blood in stool: no EGD: yes   Relevant past medical, surgical, family and social history reviewed and updated as indicated. Interim medical history since our last visit reviewed. Allergies and medications reviewed and updated.  Review of Systems  Constitutional:  Negative for activity change, appetite change, diaphoresis, fatigue and fever.  Respiratory:  Negative for cough, chest tightness, shortness of breath and wheezing.   Cardiovascular:  Negative for chest pain, palpitations and leg swelling.  Gastrointestinal: Negative.   Endocrine: Negative.   Neurological: Negative.   Psychiatric/Behavioral:  Negative for decreased concentration, self-injury, sleep disturbance and suicidal ideas. The patient is not nervous/anxious.    Per HPI unless specifically indicated  above     Objective:    BP 117/68   Pulse 60   Temp 98.6 F (37 C) (Oral)   Wt 176 lb (79.8 kg)   LMP  (LMP Unknown)   SpO2 97%   BMI 26.67 kg/m   Wt Readings from Last 3 Encounters:  09/17/20 176 lb (79.8 kg)  08/20/20 176 lb (79.8 kg)  06/22/20 176 lb 9.6 oz (80.1 kg)    Physical Exam Vitals and nursing note reviewed.  Constitutional:      General: She is awake. She is not in acute distress.    Appearance: She is well-developed. She is not ill-appearing.  HENT:     Head: Normocephalic.     Right Ear: Hearing and external ear normal. No drainage.     Left Ear: Hearing and external ear normal. No drainage.  Eyes:     General: Lids are normal.        Right eye: No discharge.        Left eye: No discharge.     Conjunctiva/sclera: Conjunctivae normal.     Pupils: Pupils are equal, round, and reactive to light.  Neck:     Thyroid: No thyromegaly.     Vascular: No carotid bruit or JVD.  Cardiovascular:     Rate and Rhythm: Normal rate and regular rhythm.     Heart sounds: Normal heart sounds. No murmur heard.   No gallop.  Pulmonary:     Effort: Pulmonary effort is normal. No accessory muscle usage or respiratory distress.  Breath sounds: Normal breath sounds.  Abdominal:     General: Bowel sounds are normal.     Palpations: Abdomen is soft.  Musculoskeletal:     Cervical back: Normal range of motion and neck supple.     Right lower leg: No edema.     Left lower leg: No edema.  Lymphadenopathy:     Cervical: No cervical adenopathy.  Skin:    General: Skin is warm and dry.  Neurological:     Mental Status: She is alert and oriented to person, place, and time.  Psychiatric:        Attention and Perception: Attention normal.        Mood and Affect: Mood normal.        Speech: Speech normal.        Behavior: Behavior normal. Behavior is cooperative.   Results for orders placed or performed in visit on 05/10/20  Bayer DCA Hb A1c Waived  Result Value Ref Range    HB A1C (BAYER DCA - WAIVED) 6.3 <7.0 %  Microalbumin, Urine Waived  Result Value Ref Range   Microalb, Ur Waived 30 (H) 0 - 19 mg/L   Creatinine, Urine Waived 300 10 - 300 mg/dL   Microalb/Creat Ratio <30 <30 mg/g  Lipid Panel w/o Chol/HDL Ratio  Result Value Ref Range   Cholesterol, Total 160 100 - 199 mg/dL   Triglycerides 104 0 - 149 mg/dL   HDL 45 >39 mg/dL   VLDL Cholesterol Cal 19 5 - 40 mg/dL   LDL Chol Calc (NIH) 96 0 - 99 mg/dL  Comprehensive metabolic panel  Result Value Ref Range   Glucose 85 65 - 99 mg/dL   BUN 13 8 - 27 mg/dL   Creatinine, Ser 0.90 0.57 - 1.00 mg/dL   eGFR 73 >59 mL/min/1.73   BUN/Creatinine Ratio 14 12 - 28   Sodium 142 134 - 144 mmol/L   Potassium 3.8 3.5 - 5.2 mmol/L   Chloride 102 96 - 106 mmol/L   CO2 24 20 - 29 mmol/L   Calcium 9.4 8.7 - 10.3 mg/dL   Total Protein 7.1 6.0 - 8.5 g/dL   Albumin 4.6 3.8 - 4.8 g/dL   Globulin, Total 2.5 1.5 - 4.5 g/dL   Albumin/Globulin Ratio 1.8 1.2 - 2.2   Bilirubin Total 0.3 0.0 - 1.2 mg/dL   Alkaline Phosphatase 78 44 - 121 IU/L   AST 19 0 - 40 IU/L   ALT 18 0 - 32 IU/L  Magnesium  Result Value Ref Range   Magnesium 2.1 1.6 - 2.3 mg/dL  VITAMIN D 25 Hydroxy (Vit-D Deficiency, Fractures)  Result Value Ref Range   Vit D, 25-Hydroxy 32.2 30.0 - 100.0 ng/mL      Assessment & Plan:   Problem List Items Addressed This Visit       Digestive   GERD (gastroesophageal reflux disease) - Primary    Chronic and improving.  Discussed with patient no eating 2-3 hours before bed and no taking snacks to bed with her.  Continue Prilosec 40 MG daily and Bentyl BID for gas discomfort as is offering benefit.  Recent Mag level stable.  Recommend she ensure not to lie down for 2-3 hours after eating, as can irritate symptoms.  Return as scheduled in October.  Discussed long term use of PPI and recommend trial reductions in future.      Other Visit Diagnoses     Screening-pulmonary TB       Quantiferon obtained  today.   Relevant Orders   QuantiFERON-TB Gold Plus        Follow up plan: Return for as scheduled in October.

## 2020-09-23 LAB — QUANTIFERON-TB GOLD PLUS
QuantiFERON Mitogen Value: >10 IU/mL
QuantiFERON Nil Value: 0.04 IU/mL
QuantiFERON TB1 Ag Value: 0.02 IU/mL
QuantiFERON TB2 Ag Value: 0.01 IU/mL
QuantiFERON-TB Gold Plus: NEGATIVE

## 2020-09-23 NOTE — Progress Notes (Signed)
Contacted via MyChart   Good morning Suzanne Scott = TB testing is negative.

## 2020-10-12 ENCOUNTER — Other Ambulatory Visit: Payer: Self-pay

## 2020-10-12 ENCOUNTER — Ambulatory Visit (INDEPENDENT_AMBULATORY_CARE_PROVIDER_SITE_OTHER): Payer: Commercial Managed Care - PPO | Admitting: Podiatry

## 2020-10-12 DIAGNOSIS — M722 Plantar fascial fibromatosis: Secondary | ICD-10-CM | POA: Diagnosis not present

## 2020-10-12 MED ORDER — MELOXICAM 15 MG PO TABS: 15.0000 mg | ORAL_TABLET | Freq: Every day | ORAL | 0 refills | Status: DC

## 2020-10-12 MED ORDER — BETAMETHASONE SOD PHOS & ACET 6 (3-3) MG/ML IJ SUSP
3.0000 mg | Freq: Once | INTRAMUSCULAR | Status: AC
  Administered 2020-10-12: 3 mg via INTRA_ARTICULAR

## 2020-10-12 MED ORDER — MELOXICAM 15 MG PO TABS: 15.0000 mg | ORAL_TABLET | Freq: Every day | ORAL | 1 refills | Status: DC

## 2020-10-12 NOTE — Progress Notes (Signed)
   Subjective: 60 y.o. female presenting to the office today for follow-up evaluation of plantar fasciitis to the left heel.  Patient states that she has had some improvement.  She says that the injections helped significantly.  She presents for further treatment and evaluation   Past Medical History:  Diagnosis Date   Anxiety    Atrophic vaginitis    Hyperlipidemia      Objective: Physical Exam General: The patient is alert and oriented x3 in no acute distress.  Dermatology: Skin is warm, dry and supple bilateral lower extremities. Negative for open lesions or macerations bilateral.   Vascular: Dorsalis Pedis and Posterior Tibial pulses palpable bilateral.  Capillary fill time is immediate to all digits.  Neurological: Epicritic and protective threshold intact bilateral.   Musculoskeletal: Improved tenderness to palpation to the plantar aspect of the left heel along the plantar fascia. All other joints range of motion within normal limits bilateral. Strength 5/5 in all groups bilateral.    Assessment: 1. Plantar fasciitis left foot  Plan of Care:  1. Patient evaluated. Xrays reviewed.   2. Injection of 0.5cc Celestone soluspan injected into the left plantar fascia.  3.  Patient states that she lost her custom molded orthotics that were prescribed at a different office.  OTC power step insoles were provided for the patient today 4.  Refill prescription for meloxicam 15 mg daily 5.  Return to clinic in 4 weeks.    *Works at [employer]   Edrick Kins, DPM [facility]  Dr. Edrick Kins, DPM    [address], [place] [address]                Office [phone]  Fax [phone]

## 2020-10-21 ENCOUNTER — Telehealth: Payer: Self-pay

## 2020-10-21 NOTE — Telephone Encounter (Signed)
Copied from [place] [id number]. Topic: General - Other >> Oct 20, 2020  4:34 PM Yvette Rack wrote: Reason for CRM: Pt called to see if the paperwork that was faxed from [facility] was received. Pt request call back to advise. Cb# [phone]   Form was completed and signed. Faxed back to [facility]. Called and notified patient that this was done for her.

## 2020-10-27 ENCOUNTER — Other Ambulatory Visit: Payer: Self-pay | Admitting: Nurse Practitioner

## 2020-11-01 ENCOUNTER — Other Ambulatory Visit: Payer: Self-pay | Admitting: Nurse Practitioner

## 2020-11-01 DIAGNOSIS — Z1231 Encounter for screening mammogram for malignant neoplasm of breast: Secondary | ICD-10-CM

## 2020-11-06 ENCOUNTER — Other Ambulatory Visit: Payer: Self-pay | Admitting: Nurse Practitioner

## 2020-11-16 ENCOUNTER — Other Ambulatory Visit: Payer: Self-pay

## 2020-11-16 ENCOUNTER — Ambulatory Visit (INDEPENDENT_AMBULATORY_CARE_PROVIDER_SITE_OTHER): Payer: Commercial Managed Care - PPO | Admitting: Nurse Practitioner

## 2020-11-16 ENCOUNTER — Encounter: Payer: Self-pay | Admitting: Nurse Practitioner

## 2020-11-16 VITALS — BP 135/80 | HR 62 | Temp 98.1°F | Ht 69.0 in | Wt 173.4 lb

## 2020-11-16 DIAGNOSIS — F419 Anxiety disorder, unspecified: Secondary | ICD-10-CM | POA: Diagnosis not present

## 2020-11-16 DIAGNOSIS — R7303 Prediabetes: Secondary | ICD-10-CM

## 2020-11-16 DIAGNOSIS — F324 Major depressive disorder, single episode, in partial remission: Secondary | ICD-10-CM | POA: Diagnosis not present

## 2020-11-16 DIAGNOSIS — E782 Mixed hyperlipidemia: Secondary | ICD-10-CM

## 2020-11-16 DIAGNOSIS — R030 Elevated blood-pressure reading, without diagnosis of hypertension: Secondary | ICD-10-CM

## 2020-11-16 DIAGNOSIS — Z0000 Encounter for general adult medical examination without abnormal findings: Secondary | ICD-10-CM | POA: Diagnosis not present

## 2020-11-16 DIAGNOSIS — Z23 Encounter for immunization: Secondary | ICD-10-CM

## 2020-11-16 DIAGNOSIS — K219 Gastro-esophageal reflux disease without esophagitis: Secondary | ICD-10-CM

## 2020-11-16 DIAGNOSIS — E559 Vitamin D deficiency, unspecified: Secondary | ICD-10-CM

## 2020-11-16 NOTE — Assessment & Plan Note (Signed)
Chronic ongoing, taking prilosec with relief, repeat magnesium level today

## 2020-11-16 NOTE — Patient Instructions (Signed)
Prediabetes Eating Plan °Prediabetes is a condition that causes blood sugar (glucose) levels to be higher than normal. This increases the risk for developing type 2 diabetes (type 2 diabetes mellitus). Working with a health care provider or nutrition specialist (dietitian) to make diet and lifestyle changes can help prevent the onset of diabetes. These changes may help you: °Control your blood glucose levels. °Improve your cholesterol levels. °Manage your blood pressure. °What are tips for following this plan? °Reading food labels °Read food labels to check the amount of fat, salt (sodium), and sugar in prepackaged foods. Avoid foods that have: °Saturated fats. °Trans fats. °Added sugars. °Avoid foods that have more than 300 milligrams (mg) of sodium per serving. Limit your sodium intake to less than 2,300 mg each day. °Shopping °Avoid buying pre-made and processed foods. °Avoid buying drinks with added sugar. °Cooking °Cook with olive oil. Do not use butter, lard, or ghee. °Bake, broil, grill, steam, or boil foods. Avoid frying. °Meal planning ° °Work with your dietitian to create an eating plan that is right for you. This may include tracking how many calories you take in each day. Use a food diary, notebook, or mobile application to track what you eat at each meal. °Consider following a Mediterranean diet. This includes: °Eating several servings of fresh fruits and vegetables each day. °Eating fish at least twice a week. °Eating one serving each day of whole grains, beans, nuts, and seeds. °Using olive oil instead of other fats. °Limiting alcohol. °Limiting red meat. °Using nonfat or low-fat dairy products. °Consider following a plant-based diet. This includes dietary choices that focus on eating mostly vegetables and fruit, grains, beans, nuts, and seeds. °If you have high blood pressure, you may need to limit your sodium intake or follow a diet such as the DASH (Dietary Approaches to Stop Hypertension) eating  plan. The DASH diet aims to lower high blood pressure. °Lifestyle °Set weight loss goals with help from your health care team. It is recommended that most people with prediabetes lose 7% of their body weight. °Exercise for at least 30 minutes 5 or more days a week. °Attend a support group or seek support from a mental health counselor. °Take over-the-counter and prescription medicines only as told by your health care provider. °What foods are recommended? °Fruits °Berries. Bananas. Apples. Oranges. Grapes. Papaya. Mango. Pomegranate. Kiwi. Grapefruit. Cherries. °Vegetables °Lettuce. Spinach. Peas. Beets. Cauliflower. Cabbage. Broccoli. Carrots. Tomatoes. Squash. Eggplant. Herbs. Peppers. Onions. Cucumbers. Brussels sprouts. °Grains °Whole grains, such as whole-wheat or whole-grain breads, crackers, cereals, and pasta. Unsweetened oatmeal. Bulgur. Barley. Quinoa. Brown rice. Corn or whole-wheat flour tortillas or taco shells. °Meats and other proteins °Seafood. Poultry without skin. Lean cuts of pork and beef. Tofu. Eggs. Nuts. Beans. °Dairy °Low-fat or fat-free dairy products, such as yogurt, cottage cheese, and cheese. °Beverages °Water. Tea. Coffee. Sugar-free or diet soda. Seltzer water. Low-fat or nonfat milk. Milk alternatives, such as soy or almond milk. °Fats and oils °Olive oil. Canola oil. Sunflower oil. Grapeseed oil. Avocado. Walnuts. °Sweets and desserts °Sugar-free or low-fat pudding. Sugar-free or low-fat ice cream and other frozen treats. °Seasonings and condiments °Herbs. Sodium-free spices. Mustard. Relish. Low-salt, low-sugar ketchup. Low-salt, low-sugar barbecue sauce. Low-fat or fat-free mayonnaise. °The items listed above may not be a complete list of recommended foods and beverages. Contact a dietitian for more information. °What foods are not recommended? °Fruits °Fruits canned with syrup. °Vegetables °Canned vegetables. Frozen vegetables with butter or cream sauce. °Grains °Refined white  flour and flour   products, such as bread, pasta, snack foods, and cereals. °Meats and other proteins °Fatty cuts of meat. Poultry with skin. Breaded or fried meat. Processed meats. °Dairy °Full-fat yogurt, cheese, or milk. °Beverages °Sweetened drinks, such as iced tea and soda. °Fats and oils °Butter. Lard. Ghee. °Sweets and desserts °Baked goods, such as cake, cupcakes, pastries, cookies, and cheesecake. °Seasonings and condiments °Spice mixes with added salt. Ketchup. Barbecue sauce. Mayonnaise. °The items listed above may not be a complete list of foods and beverages that are not recommended. Contact a dietitian for more information. °Where to find more information °American Diabetes Association: www.diabetes.org °Summary °You may need to make diet and lifestyle changes to help prevent the onset of diabetes. These changes can help you control blood sugar, improve cholesterol levels, and manage blood pressure. °Set weight loss goals with help from your health care team. It is recommended that most people with prediabetes lose 7% of their body weight. °Consider following a Mediterranean diet. This includes eating plenty of fresh fruits and vegetables, whole grains, beans, nuts, seeds, fish, and low-fat dairy, and using olive oil instead of other fats. °This information is not intended to replace advice given to you by your health care provider. Make sure you discuss any questions you have with your health care provider. °Document Revised: 04/17/2019 Document Reviewed: 04/17/2019 °Elsevier Patient Education © 2022 [employer] Inc[employer] ° °

## 2020-11-16 NOTE — Assessment & Plan Note (Signed)
Ongoing with BP below goal today on recheck.  No current medications.  Continue focus on DASH diet and regular exercise.  Recommend she monitor BP at home daily and will continue collaboration with cardiology.  Initiate medication as needed -- would consider Losartan due to urine ALB 30 today.  CMP & TSH today.

## 2020-11-16 NOTE — Assessment & Plan Note (Addendum)
Chronic ongoing, denies acute issues, continue medication regimen. Follow up in 6 months.

## 2020-11-16 NOTE — Assessment & Plan Note (Signed)
Chronic ongoing, lab checked today, continue medication regimen

## 2020-11-16 NOTE — Assessment & Plan Note (Signed)
Chronic ongoing, denies SI/HI, continue medication regimen. Follow up in 6 months

## 2020-11-16 NOTE — Progress Notes (Signed)
BP 135/80   Pulse 62   Temp 98.1 F (36.7 C) (Oral)   Ht 5\' 9"  (1.753 m)   Wt 173 lb 6 oz (78.6 kg)   LMP  (LMP Unknown)   SpO2 97%   BMI 25.60 kg/m    Subjective:   NOTE WRITTEN BY [employer] DNP STUDENT.  ASSESSMENT AND PLAN OF CARE REVIEWED WITH STUDENT, AGREE WITH ABOVE FINDINGS AND PLAN.    Patient ID: Suzanne Scott, female    DOB: 02-16-1960, 60 y.o.   MRN: [id number]  HPI: Suzanne Scott is a 60 y.o. female presenting on 11/16/2020 for comprehensive medical examination. States she wants some advise on the type of diet she should be on to prevent her from getting diabetes.   She currently lives with: spouse Menopausal Symptoms: no  The patient does not have a history of falls. I did not complete a risk assessment for falls. A plan of care for falls was not documented.  ANXIETY/STRESS Patient states she is doing good, denies SI/HI Duration:controlled Anxious mood: no  Excessive worrying: no Irritability: no  Sweating: no Nausea: no Palpitations:no Hyperventilation: no Panic attacks: no Agoraphobia: no  Obscessions/compulsions: no Depressed mood: no Depression screen [facility] 2/9 11/16/2020 09/17/2020 05/10/2020 11/10/2019 06/13/2019  Decreased Interest 0 0 0 0 0  Down, Depressed, Hopeless 0 0 0 0 0  PHQ - 2 Score 0 0 0 0 0  Altered sleeping 0 0 0 0 3  Tired, decreased energy 0 0 0 0 1  Change in appetite 0 0 0 0 0  Feeling bad or failure about yourself  0 0 0 0 0  Trouble concentrating 2 1 0 0 3  Moving slowly or fidgety/restless 0 0 0 0 1  Suicidal thoughts 0 0 0 0 0  PHQ-9 Score 2 1 0 0 8  Difficult doing work/chores Not difficult at all Not difficult at all - Not difficult at all -  Some recent data might be hidden   Anhedonia: no Weight changes: no Insomnia: no  Hypersomnia: no Fatigue/loss of energy: no Feelings of worthlessness: no Feelings of guilt: no Impaired concentration/indecisiveness: no Suicidal ideations: no  Crying spells: no Recent  Stressors/Life Changes: no   Relationship problems: no   Family stress: no     Financial stress: no    Job stress: no    Recent death/loss: no   DEPRESSION Mood status: controlled Satisfied with current treatment?: yes Symptom severity: mild  Duration of current treatment : chronic Side effects: no Medication compliance: good compliance Psychotherapy/counseling: no  Previous psychiatric medications: celexa, buspar Depressed mood: no Anxious mood: no Anhedonia: no Significant weight loss or gain: no Insomnia: no  Fatigue: no Feelings of worthlessness or guilt: no Impaired concentration/indecisiveness: no Suicidal ideations: no Hopelessness: no Crying spells: no Depression screen [facility] 2/9 11/16/2020 09/17/2020 05/10/2020 11/10/2019 06/13/2019  Decreased Interest 0 0 0 0 0  Down, Depressed, Hopeless 0 0 0 0 0  PHQ - 2 Score 0 0 0 0 0  Altered sleeping 0 0 0 0 3  Tired, decreased energy 0 0 0 0 1  Change in appetite 0 0 0 0 0  Feeling bad or failure about yourself  0 0 0 0 0  Trouble concentrating 2 1 0 0 3  Moving slowly or fidgety/restless 0 0 0 0 1  Suicidal thoughts 0 0 0 0 0  PHQ-9 Score 2 1 0 0 8  Difficult doing work/chores Not difficult at all  Not difficult at all - Not difficult at all -  Some recent data might be hidden    HYPERLIPIDEMIA Hyperlipidemia status: good compliance Satisfied with current treatment?  yes Side effects:  no Medication compliance: good compliance Past cholesterol meds: atorvastain (lipitor) Supplements: none Aspirin:  no The 10-year ASCVD risk score (Arnett DK, et al., 2019) is: 6.3%   Values used to calculate the score:     Age: 29 years     Sex: Female     Is Non-Hispanic African American: Yes     Diabetic: No     Tobacco smoker: No     Systolic Blood Pressure: 408 mmHg     Is BP treated: No     HDL Cholesterol: 45 mg/dL     Total Cholesterol: 160 mg/dL Chest pain:  no Coronary artery disease:  no  Past Medical History:   Past Medical History:  Diagnosis Date   Anxiety    Atrophic vaginitis    Hyperlipidemia     Surgical History:  Past Surgical History:  Procedure Laterality Date   BREAST BIOPSY Left 04/08/2015   negative   CARPAL TUNNEL RELEASE Left 10/28/2019   Procedure: CARPAL TUNNEL RELEASE ENDOSCOPIC;  Surgeon: Corky Mull, MD;  Location: [facility];  Service: Orthopedics;  Laterality: Left;   CATARACT EXTRACTION W/PHACO Right 02/17/2020   Procedure: CATARACT EXTRACTION PHACO AND INTRAOCULAR LENS PLACEMENT ([facility]) RIGHT 4.93 00:41.7 11.8%;  Surgeon: Leandrew Koyanagi, MD;  Location: [facility];  Service: Ophthalmology;  Laterality: Right;   CATARACT EXTRACTION W/PHACO Left 03/10/2020   Procedure: CATARACT EXTRACTION PHACO AND INTRAOCULAR LENS PLACEMENT ([place]) LEFT;  Surgeon: Leandrew Koyanagi, MD;  Location: [facility];  Service: Ophthalmology;  Laterality: Left;  3.29 1:01.3 5.4%   COLONOSCOPY WITH PROPOFOL N/A 10/15/2017   Procedure: COLONOSCOPY WITH PROPOFOL;  Surgeon: Lin Landsman, MD;  Location: [facility] ENDOSCOPY;  Service: Gastroenterology;  Laterality: N/A;   ESOPHAGOGASTRODUODENOSCOPY (EGD) WITH PROPOFOL N/A 10/15/2017   Procedure: ESOPHAGOGASTRODUODENOSCOPY (EGD) WITH PROPOFOL;  Surgeon: Lin Landsman, MD;  Location: [facility] ENDOSCOPY;  Service: Gastroenterology;  Laterality: N/A;    Medications:  Current Outpatient Medications on File Prior to Visit  Medication Sig   atorvastatin (LIPITOR) 10 MG tablet TAKE 1 TABLET(10 MG) BY MOUTH DAILY   Brimonidine Tartrate (LUMIFY OP) Apply to eye 2 (two) times daily.   busPIRone (BUSPAR) 5 MG tablet TAKE 1 TABLET(5 MG) BY MOUTH TWICE DAILY   cholecalciferol (VITAMIN D) 25 MCG (1000 UNIT) tablet Take 1,000 Units by mouth daily.   citalopram (CELEXA) 40 MG tablet Take 1 tablet (40 mg total) by mouth daily.   ketorolac (ACULAR) 0.5 % ophthalmic solution 1 drop 4 (four) times daily.   omeprazole (PRILOSEC) 40 MG capsule Take 1  capsule (40 mg total) by mouth daily.   prednisoLONE acetate (PRED FORTE) 1 % ophthalmic suspension See admin instructions.   No current facility-administered medications on file prior to visit.    Allergies:  No Known Allergies  Social History:  Social History   Socioeconomic History   Marital status: Married    Spouse name: Not on file   Number of children: 3   Years of education: 12   Highest education level: Not on file  Occupational History   Not on file  Tobacco Use   Smoking status: Never   Smokeless tobacco: Never  Vaping Use   Vaping Use: Never used  Substance and Sexual Activity   Alcohol use: Not Currently   Drug  use: No   Sexual activity: Yes  Other Topics Concern   Not on file  Social History Narrative   Not on file   Social Determinants of Health   Financial Resource Strain: Low Risk    Difficulty of Paying Living Expenses: Not very hard  Food Insecurity: No Food Insecurity   Worried About Running Out of Food in the Last Year: Never true   Ran Out of Food in the Last Year: Never true  Transportation Needs: No Transportation Needs   Lack of Transportation (Medical): No   Lack of Transportation (Non-Medical): No  Physical Activity: Not on file  Stress: Not on file  Social Connections: Not on file  Intimate Partner Violence: Not on file   Social History   Tobacco Use  Smoking Status Never  Smokeless Tobacco Never   Social History   Substance and Sexual Activity  Alcohol Use Not Currently    Family History:  Family History  Problem Relation Age of Onset   Arthritis Mother    Heart disease Mother    Breast cancer Neg Hx     Past medical history, surgical history, medications, allergies, family history and social history reviewed with patient today and changes made to appropriate areas of the chart.   Review of Systems  Constitutional:  Negative for chills and fever.  Respiratory:  Negative for cough and shortness of breath.    Cardiovascular:  Negative for chest pain, palpitations and leg swelling.  Gastrointestinal:  Negative for constipation, diarrhea, nausea and vomiting.  Neurological:  Negative for dizziness, tingling and tremors.  Endo/Heme/Allergies:  Negative for polydipsia.  Psychiatric/Behavioral:  Negative for depression and suicidal ideas. The patient is not nervous/anxious.    All other ROS negative except what is listed above and in the HPI.      Objective:    BP 135/80   Pulse 62   Temp 98.1 F (36.7 C) (Oral)   Ht 5\' 9"  (1.753 m)   Wt 173 lb 6 oz (78.6 kg)   LMP  (LMP Unknown)   SpO2 97%   BMI 25.60 kg/m   Wt Readings from Last 3 Encounters:  11/16/20 173 lb 6 oz (78.6 kg)  09/17/20 176 lb (79.8 kg)  08/20/20 176 lb (79.8 kg)    Physical Exam Vitals and nursing note reviewed. Exam conducted with a chaperone present.  Constitutional:      General: She is not in acute distress.    Appearance: Normal appearance. She is not ill-appearing or toxic-appearing.  HENT:     Head: Normocephalic.  Eyes:     General: Lids are normal.  Neck:     Thyroid: No thyroid mass, thyromegaly or thyroid tenderness.     Vascular: No carotid bruit.     Trachea: Trachea normal.  Cardiovascular:     Rate and Rhythm: Normal rate and regular rhythm.     Heart sounds: Normal heart sounds, S1 normal and S2 normal.  Pulmonary:     Effort: Pulmonary effort is normal.     Breath sounds: Normal breath sounds and air entry.  Chest:     Chest wall: No mass, lacerations, deformity, swelling or tenderness.  Breasts:    Tanner Score is 5.     Breasts are symmetrical.     Right: Normal. No swelling, mass, nipple discharge or tenderness.     Left: Normal. No swelling, mass, nipple discharge or tenderness.  Abdominal:     General: Bowel sounds are normal. There is no  distension.     Palpations: Abdomen is soft.     Tenderness: There is no abdominal tenderness.  Lymphadenopathy:     Cervical: No cervical  adenopathy.     Right cervical: No superficial, deep or posterior cervical adenopathy.    Left cervical: No superficial, deep or posterior cervical adenopathy.     Upper Body:     Right upper body: No supraclavicular adenopathy.     Left upper body: No supraclavicular adenopathy.  Skin:    General: Skin is warm.  Neurological:     Mental Status: She is alert.  Psychiatric:        Behavior: Behavior is cooperative.    Results for orders placed or performed in visit on 09/17/20  QuantiFERON-TB Gold Plus  Result Value Ref Range   QuantiFERON Incubation Incubation performed.    QuantiFERON Criteria Comment    QuantiFERON TB1 Ag Value 0.02 IU/mL   QuantiFERON TB2 Ag Value 0.01 IU/mL   QuantiFERON Nil Value 0.04 IU/mL   QuantiFERON Mitogen Value >10.00 IU/mL   QuantiFERON-TB Gold Plus Negative Negative      Assessment & Plan:   Problem List Items Addressed This Visit       Digestive   GERD (gastroesophageal reflux disease)    Chronic ongoing, taking prilosec with relief, repeat magnesium level today      Relevant Orders   Magnesium     Other   Anxiety    Chronic ongoing, denies acute issues, continue medication regimen. Follow up in 6 months.      Hyperlipidemia    Chronic ongoing, taking atorvastatin with relief, repeat lab today.      Relevant Orders   Comprehensive metabolic panel   Lipid Panel w/o Chol/HDL Ratio   Depression - Primary    Chronic ongoing, denies SI/HI, continue medication regimen. Follow up in 6 months      Prediabetes    A1c on 05/10/2020 was 6.3%. Patient wants advice on diet choices for prevent her from developing diabetes. Informed about mediterranean diet consuming a healthy diet with more fruits and vegetables and engaging in exercise. Repeat A1c today.      Relevant Orders   HgB A1c   Vitamin D deficiency    Chronic ongoing, lab checked today, continue medication regimen      Relevant Orders   VITAMIN D 25 Hydroxy (Vit-D  Deficiency, Fractures)   Elevated BP without diagnosis of hypertension    Ongoing with BP below goal today on recheck.  No current medications.  Continue focus on DASH diet and regular exercise.  Recommend she monitor BP at home daily and will continue collaboration with cardiology.  Initiate medication as needed -- would consider Losartan due to urine ALB 30 today.  CMP & TSH today.      Relevant Orders   CBC with Differential/Platelet   TSH   Other Visit Diagnoses     Encounter for annual physical exam       Annual physical today and health maintenance reviewed.   Flu vaccine need       Flu vaccine provided today   Relevant Orders   Flu Vaccine QUAD 6+ mos PF IM (Fluarix Quad PF) (Completed)        Follow up plan: Return in about 6 months (around 05/17/2021) for HTN/HLD, PREDIABETES, GERD.   LABORATORY TESTING:  - Pap smear: up to date  IMMUNIZATIONS:   - Tdap: Tetanus vaccination status reviewed: last tetanus booster within 10 years. - Influenza: Up  to date - Pneumovax: Not applicable - Prevnar: Not applicable - HPV: Up to date - Zostavax vaccine: not up to date  SCREENING: -Mammogram: Up to date  - Colonoscopy: Up to date  - Bone Density: unknown  -Hearing Test: Not applicable  -Spirometry: Not applicable   PATIENT COUNSELING:   Advised to take 1 mg of folate supplement per day if capable of pregnancy.   Sexuality: Discussed sexually transmitted diseases, partner selection, use of condoms, avoidance of unintended pregnancy  and contraceptive alternatives.   Advised to avoid cigarette smoking.  I discussed with the patient that most people either abstain from alcohol or drink within safe limits (<=14/week and <=4 drinks/occasion for males, <=7/weeks and <= 3 drinks/occasion for females) and that the risk for alcohol disorders and other health effects rises proportionally with the number of drinks per week and how often a drinker exceeds daily limits.  Discussed  cessation/primary prevention of drug use and availability of treatment for abuse.   Diet: Encouraged to adjust caloric intake to maintain  or achieve ideal body weight, to reduce intake of dietary saturated fat and total fat, to limit sodium intake by avoiding high sodium foods and not adding table salt, and to maintain adequate dietary potassium and calcium preferably from fresh fruits, vegetables, and low-fat dairy products.    Stressed the importance of regular exercise  Injury prevention: Discussed safety belts, safety helmets, smoke detector, smoking near bedding or upholstery.   Dental health: Discussed importance of regular tooth brushing, flossing, and dental visits.    NEXT PREVENTATIVE PHYSICAL DUE IN 1 YEAR. Return in about 6 months (around 05/17/2021) for HTN/HLD, PREDIABETES, GERD.

## 2020-11-16 NOTE — Assessment & Plan Note (Signed)
A1c on 05/10/2020 was 6.3%. Patient wants advice on diet choices for prevent her from developing diabetes. Informed about mediterranean diet consuming a healthy diet with more fruits and vegetables and engaging in exercise. Repeat A1c today.

## 2020-11-16 NOTE — Assessment & Plan Note (Signed)
Chronic ongoing, taking atorvastatin with relief, repeat lab today.

## 2020-11-17 LAB — COMPREHENSIVE METABOLIC PANEL
ALT: 20 IU/L (ref 0–32)
AST: 21 IU/L (ref 0–40)
Albumin/Globulin Ratio: 1.9 (ref 1.2–2.2)
Albumin: 4.6 g/dL (ref 3.8–4.8)
Alkaline Phosphatase: 78 IU/L (ref 44–121)
BUN/Creatinine Ratio: 14 (ref 12–28)
BUN: 13 mg/dL (ref 8–27)
Bilirubin Total: 0.4 mg/dL (ref 0.0–1.2)
CO2: 25 mmol/L (ref 20–29)
Calcium: 9.3 mg/dL (ref 8.7–10.3)
Chloride: 103 mmol/L (ref 96–106)
Creatinine, Ser: 0.96 mg/dL (ref 0.57–1.00)
Globulin, Total: 2.4 g/dL (ref 1.5–4.5)
Glucose: 70 mg/dL (ref 70–99)
Potassium: 4 mmol/L (ref 3.5–5.2)
Sodium: 141 mmol/L (ref 134–144)
Total Protein: 7 g/dL (ref 6.0–8.5)
eGFR: 67 mL/min/{1.73_m2} (ref >59)

## 2020-11-17 LAB — LIPID PANEL W/O CHOL/HDL RATIO
Cholesterol, Total: 146 mg/dL (ref 100–199)
HDL: 50 mg/dL (ref >39)
LDL Chol Calc (NIH): 82 mg/dL (ref 0–99)
Triglycerides: 68 mg/dL (ref 0–149)
VLDL Cholesterol Cal: 14 mg/dL (ref 5–40)

## 2020-11-17 LAB — CBC WITH DIFFERENTIAL/PLATELET
Basophils Absolute: 0 10*3/uL (ref 0.0–0.2)
Basos: 1 %
EOS (ABSOLUTE): 0.1 10*3/uL (ref 0.0–0.4)
Eos: 1 %
Hematocrit: 41.7 % (ref 34.0–46.6)
Hemoglobin: 13.3 g/dL (ref 11.1–15.9)
Immature Grans (Abs): 0 10*3/uL (ref 0.0–0.1)
Immature Granulocytes: 0 %
Lymphocytes Absolute: 1.8 10*3/uL (ref 0.7–3.1)
Lymphs: 39 %
MCH: 27.3 pg (ref 26.6–33.0)
MCHC: 31.9 g/dL (ref 31.5–35.7)
MCV: 86 fL (ref 79–97)
Monocytes Absolute: 0.4 10*3/uL (ref 0.1–0.9)
Monocytes: 10 %
Neutrophils Absolute: 2.3 10*3/uL (ref 1.4–7.0)
Neutrophils: 49 %
Platelets: 240 10*3/uL (ref 150–450)
RBC: 4.87 x10E6/uL (ref 3.77–5.28)
RDW: 14.1 % (ref 11.7–15.4)
WBC: 4.6 10*3/uL (ref 3.4–10.8)

## 2020-11-17 LAB — HEMOGLOBIN A1C
Est. average glucose Bld gHb Est-mCnc: 134 mg/dL
Hgb A1c MFr Bld: 6.3 % — ABNORMAL HIGH (ref 4.8–5.6)

## 2020-11-17 LAB — TSH: TSH: 0.717 u[IU]/mL (ref 0.450–4.500)

## 2020-11-17 LAB — VITAMIN D 25 HYDROXY (VIT D DEFICIENCY, FRACTURES): Vit D, 25-Hydroxy: 50.2 ng/mL (ref 30.0–100.0)

## 2020-11-17 LAB — MAGNESIUM: Magnesium: 2.4 mg/dL — ABNORMAL HIGH (ref 1.6–2.3)

## 2020-11-17 NOTE — Progress Notes (Signed)
Contacted via [place] morning Nadya, your labs have returned: - CBC continues to be stable, showing no anemia. - Kidney function, creatinine and eGFR, is normal, as is liver function, AST and ALT. - Cholesterol levels remain stable, continue your daily statin. - Vitamin D is normal.  Magnesium is a little elevated, if taking any magnesium supplements at home then stop these for now. - Your A1c continues to show prediabetes.  Your number is 6.3%, meaning you are prediabetic.  Any number 5.7 to 6.4 is considered prediabetes and any number 6.5 or greater is considered diabetes.   I would recommend heavy focus on decreasing foods high in sugar and your intake of things like bread products, pasta, and rice.  The American Diabetes Association online has a large amount of information on diet changes to make.  We will recheck this number in 6 months to ensure you are not continuing to trend upwards and move into diabetes.  The hospital also offers nutrition classes by referral if this is something you are interested in, let me know.  Any questions? Keep being amazing!!  Thank you for allowing me to participate in your care.  I appreciate you. Kindest regards, Semaya Vida

## 2020-12-01 ENCOUNTER — Other Ambulatory Visit: Payer: Self-pay

## 2020-12-01 ENCOUNTER — Ambulatory Visit
Admission: RE | Admit: 2020-12-01 | Discharge: 2020-12-01 | Disposition: A | Discharge status: Home / Self Care | Payer: Commercial Managed Care - PPO | Source: Ambulatory Visit | Attending: Nurse Practitioner | Admitting: Nurse Practitioner

## 2020-12-01 DIAGNOSIS — Z1231 Encounter for screening mammogram for malignant neoplasm of breast: Secondary | ICD-10-CM | POA: Diagnosis not present

## 2020-12-02 NOTE — Progress Notes (Signed)
Contacted via MyChart   Normal mammogram, repeat in one year:)

## 2021-01-28 ENCOUNTER — Encounter: Payer: Self-pay | Admitting: Podiatry

## 2021-01-28 ENCOUNTER — Other Ambulatory Visit: Payer: Self-pay

## 2021-01-28 ENCOUNTER — Ambulatory Visit (INDEPENDENT_AMBULATORY_CARE_PROVIDER_SITE_OTHER): Payer: Commercial Managed Care - PPO | Admitting: Podiatry

## 2021-01-28 DIAGNOSIS — L03031 Cellulitis of right toe: Secondary | ICD-10-CM

## 2021-01-28 DIAGNOSIS — L600 Ingrowing nail: Secondary | ICD-10-CM

## 2021-02-02 ENCOUNTER — Other Ambulatory Visit: Payer: Self-pay

## 2021-02-02 ENCOUNTER — Ambulatory Visit (INDEPENDENT_AMBULATORY_CARE_PROVIDER_SITE_OTHER): Payer: Commercial Managed Care - PPO | Admitting: Obstetrics and Gynecology

## 2021-02-02 VITALS — BP 130/67 | HR 74 | Ht 69.0 in | Wt 164.0 lb

## 2021-02-02 DIAGNOSIS — N8189 Other female genital prolapse: Secondary | ICD-10-CM

## 2021-02-02 NOTE — Progress Notes (Signed)
HPI:      [name] is a 61 y.o. No obstetric history on file. who LMP was No LMP recorded (lmp unknown). Patient is postmenopausal.  Subjective:   She presents today stating that she has having more pelvic pressure symptoms.  She specifically noticed symptoms when she bends over and lifts her leg up.  Her symptoms are not midline pelvic pressure.  This pelvic pressure resolves when she lies down. She was previously noted to have a cystocele which she occasionally visualized at the vaginal opening.    Hx: The following portions of the patient's history were reviewed and updated as appropriate:             She  has a past medical history of Anxiety, Atrophic vaginitis, and Hyperlipidemia. She does not have any pertinent problems on file. She  has a past surgical history that includes Esophagogastroduodenoscopy (egd) with propofol (N/A, 10/15/2017); Colonoscopy with propofol (N/A, 10/15/2017); Breast biopsy (Left, 04/08/2015); Carpal tunnel release (Left, 10/28/2019); Cataract extraction w/PHACO (Right, 02/17/2020); and Cataract extraction w/PHACO (Left, 03/10/2020). Her family history includes Arthritis in her mother; Heart disease in her mother. She  reports that she has never smoked. She has never used smokeless tobacco. She reports that she does not currently use alcohol. She reports that she does not use drugs. She has a current medication list which includes the following prescription(s): atorvastatin, brimonidine tartrate, buspirone, cholecalciferol, citalopram, ketorolac, omeprazole, and prednisolone acetate. She has No Known Allergies.       Review of Systems:  Review of Systems  Constitutional: Denied constitutional symptoms, night sweats, recent illness, fatigue, fever, insomnia and weight loss.  Eyes: Denied eye symptoms, eye pain, photophobia, vision change and visual disturbance.  Ears/Nose/Throat/Neck: Denied ear, nose, throat or neck symptoms, hearing loss, nasal discharge,  sinus congestion and sore throat.  Cardiovascular: Denied cardiovascular symptoms, arrhythmia, chest pain/pressure, edema, exercise intolerance, orthopnea and palpitations.  Respiratory: Denied pulmonary symptoms, asthma, pleuritic pain, productive sputum, cough, dyspnea and wheezing.  Gastrointestinal: Denied, gastro-esophageal reflux, melena, nausea and vomiting.  Genitourinary: See HPI for additional information.  Musculoskeletal: Denied musculoskeletal symptoms, stiffness, swelling, muscle weakness and myalgia.  Dermatologic: Denied dermatology symptoms, rash and scar.  Neurologic: Denied neurology symptoms, dizziness, headache, neck pain and syncope.  Psychiatric: Denied psychiatric symptoms, anxiety and depression.  Endocrine: Denied endocrine symptoms including hot flashes and night sweats.   Meds:   Current Outpatient Medications on File Prior to Visit  Medication Sig Dispense Refill   atorvastatin (LIPITOR) 10 MG tablet TAKE 1 TABLET(10 MG) BY MOUTH DAILY 90 tablet 1   Brimonidine Tartrate (LUMIFY OP) Apply to eye 2 (two) times daily.     busPIRone (BUSPAR) 5 MG tablet TAKE 1 TABLET(5 MG) BY MOUTH TWICE DAILY 60 tablet 2   cholecalciferol (VITAMIN D) 25 MCG (1000 UNIT) tablet Take 1,000 Units by mouth daily.     citalopram (CELEXA) 40 MG tablet Take 1 tablet (40 mg total) by mouth daily. 90 tablet 4   ketorolac (ACULAR) 0.5 % ophthalmic solution 1 drop 4 (four) times daily.     omeprazole (PRILOSEC) 40 MG capsule Take 1 capsule (40 mg total) by mouth daily. 90 capsule 4   prednisoLONE acetate (PRED FORTE) 1 % ophthalmic suspension See admin instructions.     No current facility-administered medications on file prior to visit.      Objective:     Vitals:   02/02/21 1522  BP: 130/67  Pulse: 74   Filed Weights  02/02/21 1522  Weight: 164 lb (74.4 kg)              Physical examination   Pelvic:   Vulva: Normal appearance.  No lesions.  Vagina: No lesions or  abnormalities noted.  Support: Second-degree cystocele second-degree uterine prolapse no evidence of rectocele.  Urethra No masses tenderness or scarring.  Meatus Normal size without lesions or prolapse.  Cervix: Normal appearance.  No lesions.  Anus: Normal exam.  No lesions.  Perineum: Normal exam.  No lesions.        Bimanual   Uterus: Normal size.  Non-tender.  Mobile.  AV.  Adnexae: No masses.  Non-tender to palpation.  Cul-de-sac: Negative for abnormality.             Assessment:    No obstetric history on file. Patient Active Problem List   Diagnosis Date Noted   Other female genital prolapse 04/13/2020   Elevated BP without diagnosis of hypertension 02/17/2019   Back pain 01/14/2019   Vitamin D deficiency 08/21/2018   Prediabetes 03/10/2018   Neuropathy 12/17/2017   Tendinitis, de Quervain's 07/31/2017   GERD (gastroesophageal reflux disease) 07/31/2017   Depression 02/09/2016   Allergic rhinitis 11/30/2014   Anxiety 07/16/2014   Atrophic vaginitis 07/16/2014   Hyperlipidemia 07/16/2014     1. Pelvic relaxation     Mostly cystocele.  This is likely the source of her pelvic pressure especially with Valsalva maneuver or things that increase abdominal pressure. Based on her examination and my remembrance of her last exam it is likely minimally changed.   Plan:            1.  We have discussed multiple management schemes for pelvic pressure from prolapse.  These include expectant management with decreasing heavy lifting, use of pessary, definitive surgery.  Risk benefits of each were discussed. She will inform us when she has made a decision regarding management. Literature regarding pelvic prolapse given. Orders No orders of the defined types were placed in this encounter.   No orders of the defined types were placed in this encounter.     F/U  Return for Annual Physical. I spent 23 minutes involved in the care of this patient preparing to see the patient by  obtaining and reviewing her medical history (including labs, imaging tests and prior procedures), documenting clinical information in the electronic health record (EHR), counseling and coordinating care plans, writing and sending prescriptions, ordering tests or procedures and in direct communicating with the patient and medical staff discussing pertinent items from her history and physical exam.  Finis Bud, M.D. 02/02/2021 3:43 PM

## 2021-02-11 NOTE — Progress Notes (Signed)
° °  Subjective: Patient presents today for new complaint regarding evaluation of pain to the medial border of the right great toe that is very tender to palpation for the past 2 months. Patient is concerned for possible ingrown nail.  It is very sensitive to touch.  Patient presents today for further treatment and evaluation.  Past Medical History:  Diagnosis Date   Anxiety    Atrophic vaginitis    Hyperlipidemia     Objective:  General: Well developed, nourished, in no acute distress, alert and oriented x3   Dermatology: Skin is warm, dry and supple bilateral.  Medial border right great toe appears to be erythematous with evidence of an ingrowing nail. Pain on palpation noted to the border of the nail fold. The remaining nails appear unremarkable at this time. There are no open sores, lesions.  Vascular: Dorsalis Pedis artery and Posterior Tibial artery pedal pulses palpable. No lower extremity edema noted.   Neruologic: Grossly intact via light touch bilateral.  Musculoskeletal: Muscular strength within normal limits in all groups bilateral. Normal range of motion noted to all pedal and ankle joints.   Assesement: #1 Paronychia with ingrowing nail medial border right great toe #2 Pain in toe  Plan of Care:  1. Patient evaluated.  2. Discussed treatment alternatives and plan of care. Explained nail avulsion procedure and post procedure course to patient. 3. Patient opted for permanent partial nail avulsion of the ingrown portion of the nail.  4. Prior to procedure, local anesthesia infiltration utilized using 3 ml of a 50:50 mixture of 2% plain lidocaine and 0.5% plain marcaine in a normal hallux block fashion and a betadine prep performed.  5. Partial permanent nail avulsion with chemical matrixectomy performed using [id number] applications of phenol followed by alcohol flush.  6. Light dressing applied.  Post care instructions provided 7.  Prescription for gentamicin 2% cream  8.   Return to clinic 2 weeks.  *Works at [employer].  Recently lost her husband  Edrick Kins, DPM [facility]  Dr. Edrick Kins, DPM    [address], [place] [address]                Office [phone]  Fax [phone]

## 2021-02-15 ENCOUNTER — Ambulatory Visit (INDEPENDENT_AMBULATORY_CARE_PROVIDER_SITE_OTHER): Payer: Commercial Managed Care - PPO | Admitting: Podiatry

## 2021-02-15 ENCOUNTER — Encounter: Payer: Self-pay | Admitting: Podiatry

## 2021-02-15 ENCOUNTER — Other Ambulatory Visit: Payer: Self-pay

## 2021-02-15 DIAGNOSIS — M79675 Pain in left toe(s): Secondary | ICD-10-CM | POA: Diagnosis not present

## 2021-02-15 DIAGNOSIS — L600 Ingrowing nail: Secondary | ICD-10-CM | POA: Diagnosis not present

## 2021-02-15 DIAGNOSIS — B351 Tinea unguium: Secondary | ICD-10-CM | POA: Diagnosis not present

## 2021-02-15 DIAGNOSIS — M79674 Pain in right toe(s): Secondary | ICD-10-CM

## 2021-02-24 IMAGING — MG DIGITAL SCREENING BILAT W/ TOMO
6 of 10 series · 6 of 30 positions shown · non-contrast
Comparison: Previous exam(s).

CLINICAL DATA: Screening.

EXAM:
DIGITAL SCREENING BILATERAL MAMMOGRAM WITH TOMO AND CAD

[L MLO synth-2D]
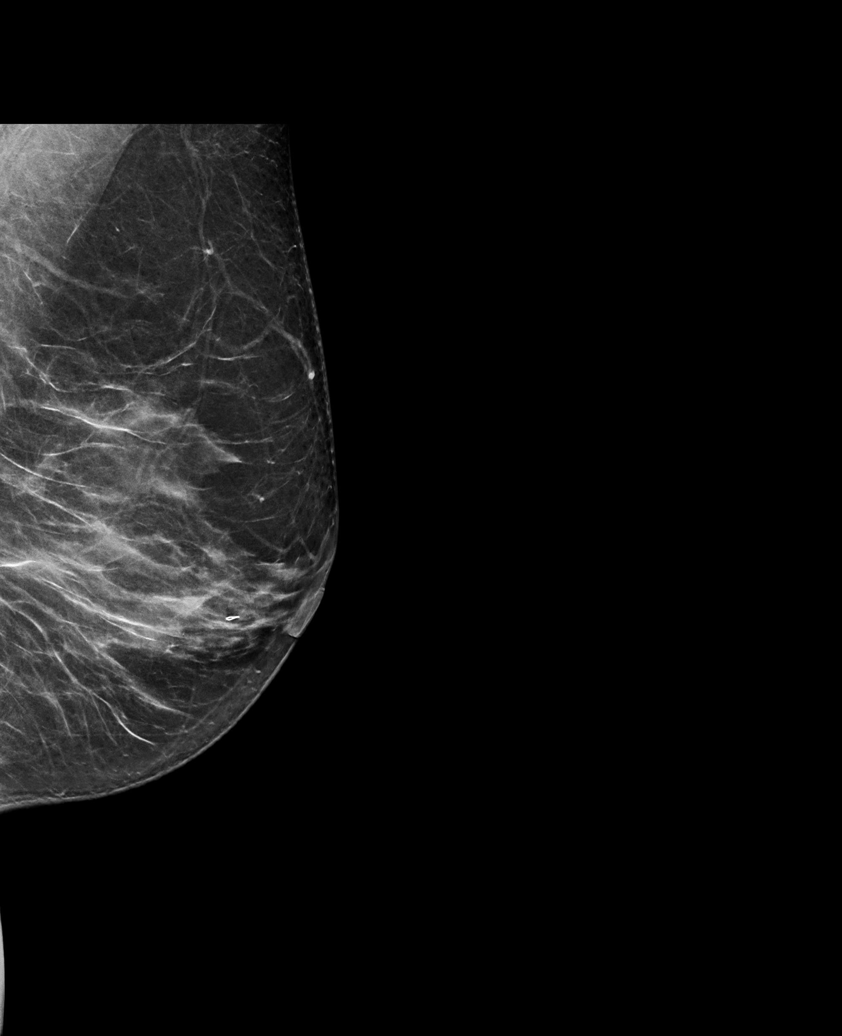

[R CC synth-2D]
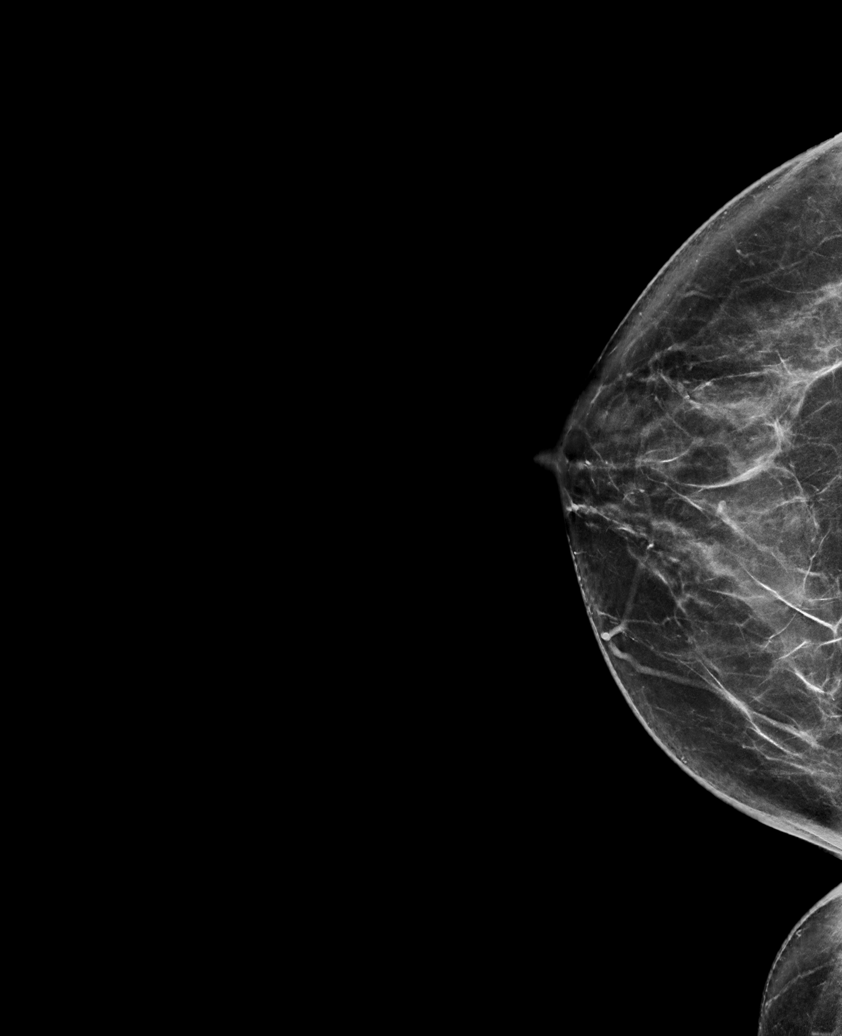

[L CC synth-2D]
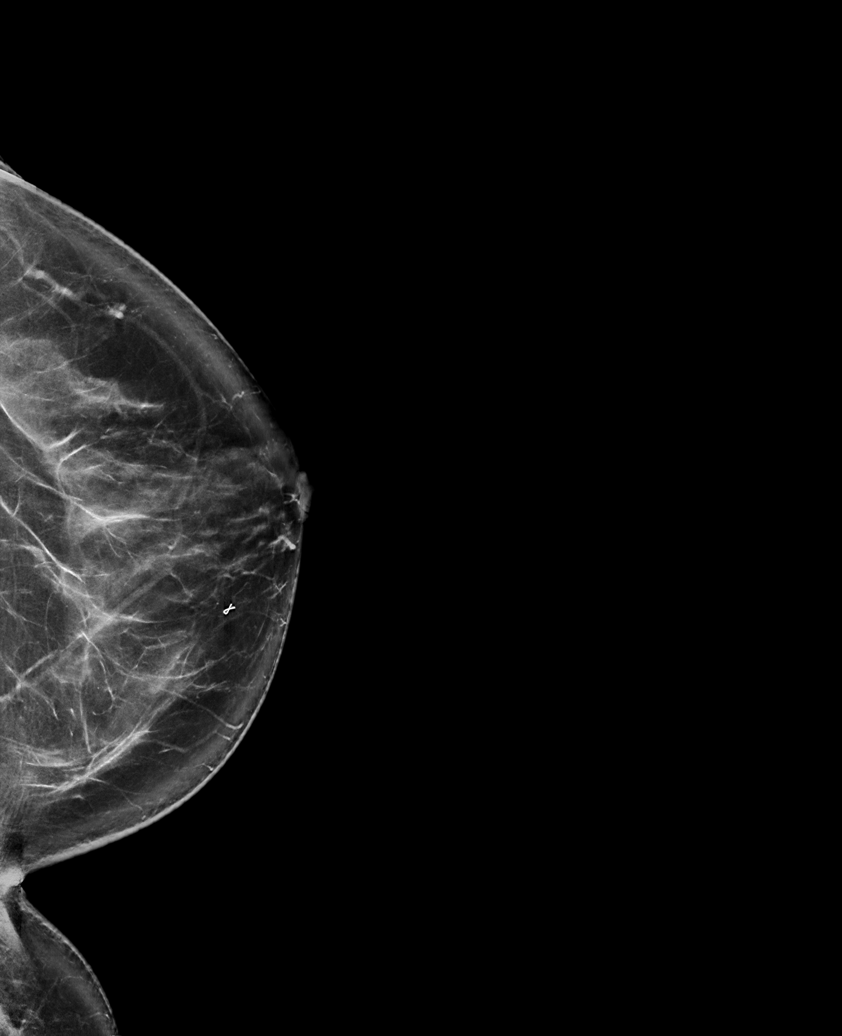

[R MLO synth-2D (1 of 2)]
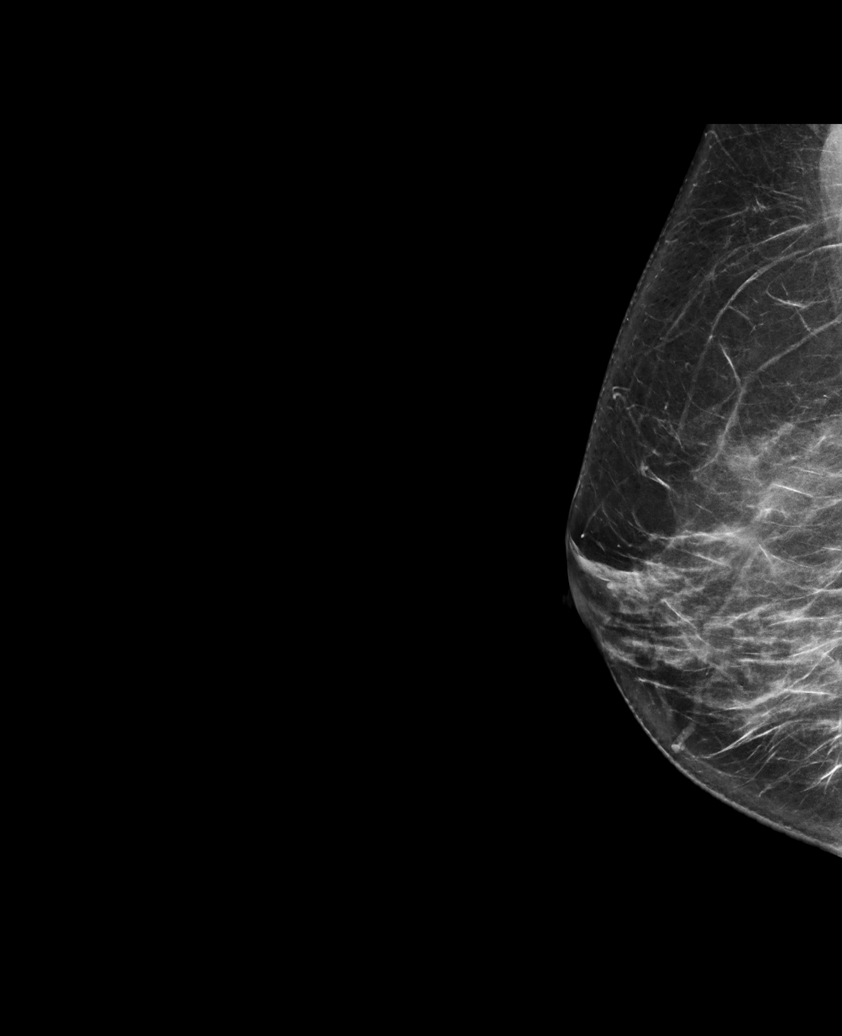

[R MLO synth-2D (2 of 2)]
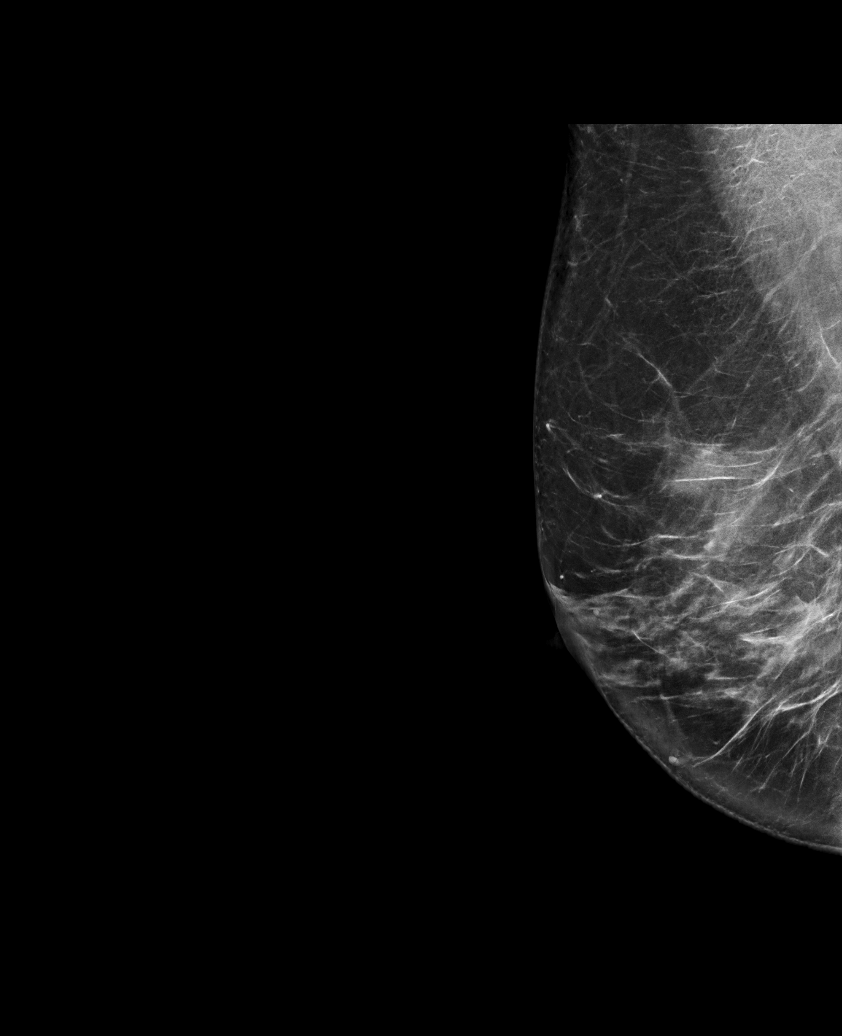

[R MLO tomo · tomo slice 43/86.0]
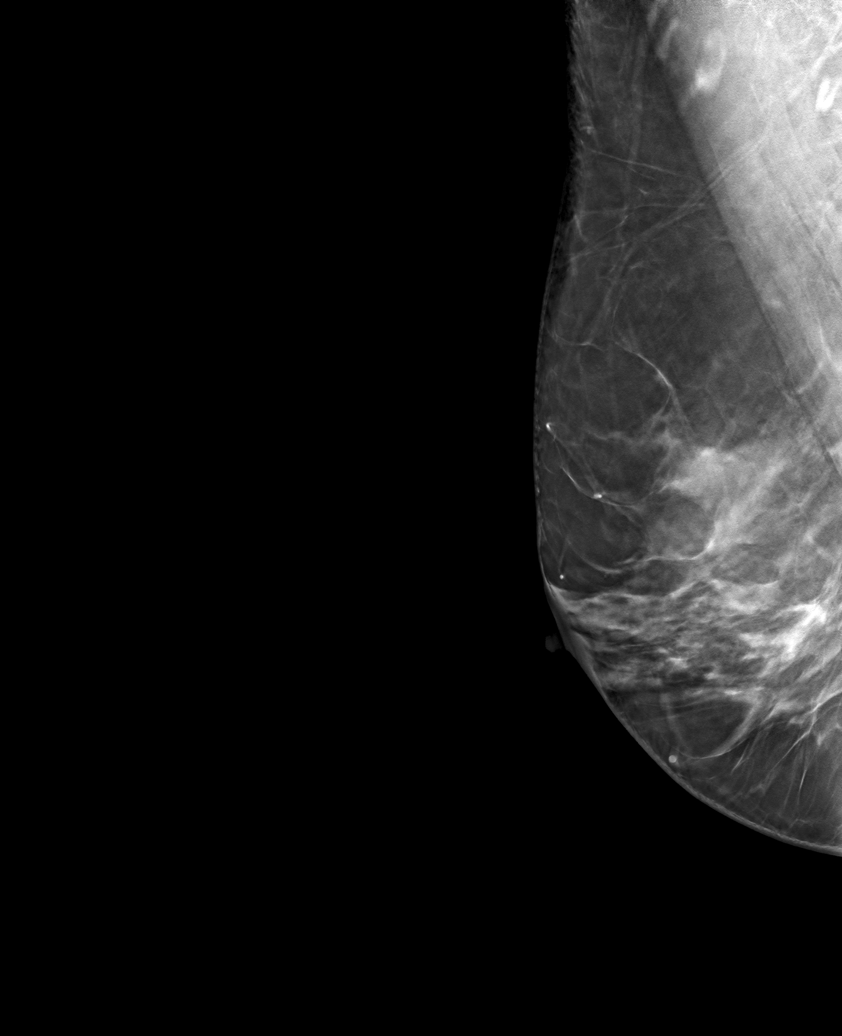

[6 of 30 positions shown; findings below may reference images not displayed]

ACR Breast Density Category c: The breast tissue is heterogeneously
dense, which may obscure small masses.
FINDINGS: There are no findings suspicious for malignancy. Images were
processed with CAD.
IMPRESSION: No mammographic evidence of malignancy. A result letter of this
screening mammogram will be mailed directly to the patient.

RECOMMENDATION:
Screening mammogram in one year. (Code:FT-U-LHB)

BI-RADS CATEGORY  1: Negative.

## 2021-02-24 NOTE — Progress Notes (Signed)
° °  Subjective: 61 y.o. female presents today status post permanent nail avulsion procedure of the medial border right great toe that was performed on 01/28/2021.  Patient states that she is doing much better.  She still has some slight tenderness to the area.  She is also requesting a nail trim.  She says that her toenails are thickened elongated and they are very symptomatic in shoes.  She is unable to trim her own nails.   Past Medical History:  Diagnosis Date   Anxiety    Atrophic vaginitis    Hyperlipidemia    Past Surgical History:  Procedure Laterality Date   BREAST BIOPSY Left 04/08/2015   negative   CARPAL TUNNEL RELEASE Left 10/28/2019   Procedure: CARPAL TUNNEL RELEASE ENDOSCOPIC;  Surgeon: Corky Mull, MD;  Location: [facility];  Service: Orthopedics;  Laterality: Left;   CATARACT EXTRACTION W/PHACO Right 02/17/2020   Procedure: CATARACT EXTRACTION PHACO AND INTRAOCULAR LENS PLACEMENT ([facility]) RIGHT 4.93 00:41.7 11.8%;  Surgeon: Leandrew Koyanagi, MD;  Location: [place];  Service: Ophthalmology;  Laterality: Right;   CATARACT EXTRACTION W/PHACO Left 03/10/2020   Procedure: CATARACT EXTRACTION PHACO AND INTRAOCULAR LENS PLACEMENT ([place]) LEFT;  Surgeon: Leandrew Koyanagi, MD;  Location: [place];  Service: Ophthalmology;  Laterality: Left;  3.29 1:01.3 5.4%   COLONOSCOPY WITH PROPOFOL N/A 10/15/2017   Procedure: COLONOSCOPY WITH PROPOFOL;  Surgeon: Lin Landsman, MD;  Location: [facility] END[facility]COPY;  Service: Gastroenterology;  Laterality: N/A;   ESOPHAGOGASTRODUODENOSCOPY (EGD) WITH PROPOFOL N/A 10/15/2017   Procedure: ESOPHAGOGASTRODUODENOSCOPY (EGD) WITH PROPOFOL;  Surgeon: Lin Landsman, MD;  Location: [facility] ENDOSCOPY;  Service: Gastroenterology;  Laterality: N/A;   No Known Allergies   Objective: Skin is warm, dry and supple. Nail and respective nail fold appears to be healing appropriately. Open wound to the associated nail fold with a granular  wound base and moderate amount of fibrotic tissue. Minimal drainage noted. Mild erythema around the periungual region likely due to phenol chemical matricectomy. Hyperkeratotic elongated discolored dystrophic nails noted 1-5 bilateral  Assessment: #1 s/p partial permanent nail matrixectomy medial border right great toe 2.  Pain due to onychomycosis of toenails both   Plan of care: #1 patient was evaluated  #2 light debridement of open wound was performed to the periungual border of the respective toe using a currette. Antibiotic ointment and Band-Aid was applied. #3  Mechanical debridement of nails 1-5 bilateral was performed using a nail nipper without incident or bleeding  #4 patient is to return to clinic on a PRN basis.   Edrick Kins, DPM [facility]  Dr. Edrick Kins, DPM    [address], [place] [address]                Office [phone]  Fax [phone]

## 2021-02-25 ENCOUNTER — Encounter: Payer: Self-pay | Admitting: Nurse Practitioner

## 2021-03-14 ENCOUNTER — Other Ambulatory Visit: Payer: Self-pay | Admitting: Nurse Practitioner

## 2021-03-15 NOTE — Telephone Encounter (Signed)
Requested Prescriptions  Pending Prescriptions Disp Refills   busPIRone (BUSPAR) 5 MG tablet [Pharmacy Med Name: BUSPIRONE 5MG  TABLETS] 60 tablet 2    Sig: TAKE 1 TABLET(5 MG) BY MOUTH TWICE DAILY     Psychiatry: Anxiolytics/Hypnotics - Non-controlled Passed - 03/14/2021  3:33 AM      Passed - Valid encounter within last 12 months    Recent Outpatient Visits          3 months ago Major depressive disorder with single episode, in partial remission ([place])   [place], [place] T, NP   5 months ago Gastroesophageal reflux disease without esophagitis   [place], [place] T, NP   6 months ago Chest pain, unspecified type   [place], Jolene T, NP   8 months ago Major depressive disorder with single episode, in partial remission ([place])   [place], Jolene T, NP   10 months ago Major depressive disorder with single episode, in partial remission ([place])   [place], Barbaraann Faster, NP      Future Appointments            In 2 months Cannady, Barbaraann Faster, NP [facility]

## 2021-04-13 ENCOUNTER — Ambulatory Visit: Payer: Self-pay | Admitting: *Deleted

## 2021-04-13 NOTE — Telephone Encounter (Signed)
?  Chief Complaint: Back pain/ weight loss/ not feeling herself ?Symptoms: Burning back pain starting under arms and going to back ?Frequency: Come and goes  for 6 weeks ?Pertinent Negatives: Patient denies chest pain, sob, numbness ?Disposition: '[]'$ ED /'[]'$ Urgent Care (no appt availability in office) / '[x]'$ Appointment(In office/virtual)/ '[]'$  [facility]/ '[]'$ Home Care/ '[]'$ Refused Recommended Disposition /'[]'$ [place] [facility]/ '[]'$  Follow-up with PCP ?Additional Notes: Pt states she does not feel right. Has had weight loss - is dealing with loss of her husband. Back pain started about 6 weeks ago. Pain is under her arms, a burning sensation that goes to her back. ? ?

## 2021-04-13 NOTE — Telephone Encounter (Signed)
Summary: Call back request from PCP, appt declined  ? Pt wants to speak to PCP regarding her overall mental and physical health, declined appt and simply wants a call back from the clinic  ?Best contact: [phone]   ?  ?Attempted to patient regarding concerns- left message to call office ?

## 2021-04-13 NOTE — Telephone Encounter (Signed)
3rd attempt, pt called left VM to call office back to discuss with a nurse ? ?Summary: Call back request from PCP, appt declined  ?  Pt wants to speak to PCP regarding her overall mental and physical health, declined appt and simply wants a call back from the clinic  ?Best contact: [phone]   ?  ? ?

## 2021-04-13 NOTE — Telephone Encounter (Signed)
Reason for Disposition ?? [1] MODERATE back pain (e.g., interferes with normal activities) AND [2] present > 3 days ? ?Answer Assessment - Initial Assessment Questions ?1. ONSET: "When did the pain begin?"  ?    6 weeks ?2. LOCATION: "Where does it hurt?" (upper, mid or lower back) ?    Underarms to the back ?3. SEVERITY: "How bad is the pain?"  (e.g., Scale 1-10; mild, moderate, or severe) ?  - MILD (1-3): doesn't interfere with normal activities  ?  - MODERATE (4-7): interferes with normal activities or awakens from sleep  ?  - SEVERE (8-10): excruciating pain, unable to do any normal activities  ?    7/10 ?4. PATTERN: "Is the pain constant?" (e.g., yes, no; constant, intermittent)  ?    no ?5. RADIATION: "Does the pain shoot into your legs or elsewhere?" ?    no ?6. CAUSE:  "What do you think is causing the back pain?"  ?   no ?7. BACK OVERUSE:  "Any recent lifting of heavy objects, strenuous work or exercise?" ?    no ?8. MEDICATIONS: "What have you taken so far for the pain?" (e.g., nothing, acetaminophen, NSAIDS) ?    no ?9. NEUROLOGIC SYMPTOMS: "Do you have any weakness, numbness, or problems with bowel/bladder control?" ?    no ?10. OTHER SYMPTOMS: "Do you have any other symptoms?" (e.g., fever, abdominal pain, burning with urination, blood in urine) ?      no ?11. PREGNANCY: "Is there any chance you are pregnant?" (e.g., yes, no; LMP) ?      na ? ?Protocols used: Back Pain-A-AH ? ?

## 2021-04-13 NOTE — Telephone Encounter (Signed)
Noted  

## 2021-04-13 NOTE — Telephone Encounter (Signed)
Second attempt to reach patient- left message to call office °

## 2021-04-14 ENCOUNTER — Ambulatory Visit (INDEPENDENT_AMBULATORY_CARE_PROVIDER_SITE_OTHER): Payer: Commercial Managed Care - PPO | Admitting: Nurse Practitioner

## 2021-04-14 ENCOUNTER — Encounter: Payer: Self-pay | Admitting: Nurse Practitioner

## 2021-04-14 ENCOUNTER — Other Ambulatory Visit: Payer: Self-pay

## 2021-04-14 VITALS — BP 132/70 | HR 90 | Ht 69.0 in | Wt 165.2 lb

## 2021-04-14 DIAGNOSIS — M75102 Unspecified rotator cuff tear or rupture of left shoulder, not specified as traumatic: Secondary | ICD-10-CM | POA: Diagnosis not present

## 2021-04-14 DIAGNOSIS — M75101 Unspecified rotator cuff tear or rupture of right shoulder, not specified as traumatic: Secondary | ICD-10-CM | POA: Diagnosis not present

## 2021-04-14 DIAGNOSIS — M545 Low back pain, unspecified: Secondary | ICD-10-CM

## 2021-04-14 DIAGNOSIS — F324 Major depressive disorder, single episode, in partial remission: Secondary | ICD-10-CM | POA: Diagnosis not present

## 2021-04-14 DIAGNOSIS — F419 Anxiety disorder, unspecified: Secondary | ICD-10-CM | POA: Diagnosis not present

## 2021-04-14 MED ORDER — BUSPIRONE HCL 10 MG PO TABS: 10.0000 mg | ORAL_TABLET | Freq: Two times a day (BID) | ORAL | 4 refills | Status: AC

## 2021-04-14 NOTE — Assessment & Plan Note (Signed)
Refer to depression plan of care. 

## 2021-04-14 NOTE — Patient Instructions (Signed)
Managing Loss, Adult °People experience loss in many different ways throughout their lives. Events such as moving, changing jobs, and losing friends can create a sense of loss. The loss may be as serious as a major health change, divorce, death of a pet, or death of a loved one. All of these types of loss are likely to create a physical and emotional reaction known as grief. Grief is the result of a major change or an absence of something or someone that you count on. Grief is a normal reaction to loss. °A variety of factors can affect your grieving experience, including: °The nature of your loss. °Your relationship to what or whom you lost. °Your understanding of grief and how to manage it. °Your support system. °Be aware that when grief becomes extreme, it can lead to more severe issues like isolation, depression, anxiety, or suicidal thoughts. Talk with your health care provider if you have any of these issues. °How to manage lifestyle changes °Keep to your normal routine as much as possible. °If you have trouble focusing or doing normal activities, it is acceptable to take some time away from your normal routine. °Spend time with friends and loved ones. °Eat a healthy diet, get plenty of sleep, and rest when you feel tired. °How to recognize changes  °The way that you deal with your grief will affect your ability to function as you normally do. When grieving, you may experience these changes: °Numbness, shock, sadness, anxiety, anger, denial, and guilt. °Thoughts about death. °Unexpected crying. °A physical sensation of emptiness in your stomach. °Problems sleeping and eating. °Tiredness (fatigue). °Loss of interest in normal activities. °Dreaming about or imagining seeing the person who died. °A need to remember what or whom you lost. °Difficulty thinking about anything other than your loss for a period of time. °Relief. If you have been expecting the loss for a while, you may feel a sense of relief when it  happens. °Follow these instructions at home: °Activity °Express your feelings in healthy ways, such as: °Talking with others about your loss. It may be helpful to find others who have had a similar loss, such as a support group. °Writing down your feelings in a journal. °Doing physical activities to release stress and emotional energy. °Doing creative activities like painting, sculpting, or playing or listening to music. °Practicing resilience. This is the ability to recover and adjust after facing challenges. Reading some resources that encourage resilience may help you to learn ways to practice those behaviors. ° °General instructions °Be patient with yourself and others. Allow the grieving process to happen, and remember that grieving takes time. °It is likely that you may never feel completely done with some grief. You may find a way to move on while still cherishing memories and feelings about your loss. °Accepting your loss is a process. It can take months or longer to adjust. °Keep all follow-up visits. This is important. °Where to find support °To get support for managing loss: °Ask your health care provider for help and recommendations, such as grief counseling or therapy. °Think about joining a support group for people who are managing a loss. °Where to find more information °You can find more information about managing loss from: °American Society of Clinical Oncology: www.cancer.net °American Psychological Association: www.apa.org °Contact a health care provider if: °Your grief is extreme and keeps getting worse. °You have ongoing grief that does not improve. °Your body shows symptoms of grief, such as illness. °You feel depressed, anxious, or   hopeless. °Get help right away if: °You have thoughts about hurting yourself or others. °Get help right away if you feel like you may hurt yourself or others, or have thoughts about taking your own life. Go to your nearest emergency room or: °Call 911. °Call the  National Suicide Prevention Lifeline at 1-800-273-8255 or 988. This is open 24 hours a day. °Text the Crisis Text Line at 741741. °Summary °Grief is the result of a major change or an absence of someone or something that you count on. Grief is a normal reaction to loss. °The depth of grief and the period of recovery depend on the type of loss and your ability to adjust to the change and process your feelings. °Processing grief requires patience and a willingness to accept your feelings and talk about your loss with people who are supportive. °It is important to find resources that work for you and to realize that people experience grief differently. There is not one grieving process that works for everyone in the same way. °Be aware that when grief becomes extreme, it can lead to more severe issues like isolation, depression, anxiety, or suicidal thoughts. Talk with your health care provider if you have any of these issues. °This information is not intended to replace advice given to you by your health care provider. Make sure you discuss any questions you have with your health care provider. °Document Revised: 09/06/2020 Document Reviewed: 09/06/2020 °Elsevier Patient Education © 2022 [employer] ° °

## 2021-04-14 NOTE — Progress Notes (Signed)
? ?BP 132/70   Pulse 90   Ht 5' 9" (1.753 m)   Wt 165 lb 3.2 oz (74.9 kg)   LMP  (LMP Unknown)   SpO2 99%   BMI 24.40 kg/m?   ? ?Subjective:  ? ? Patient ID: Suzanne Scott, female    DOB: [birth year], 61 y.o.   MRN: [id number] ? ?HPI: ?Suzanne Scott is a 61 y.o. female ? ?Chief Complaint  ?Patient presents with  ? Back Pain  ?  Patient states she has been having a pain under her arms and radiates back to her back. Patient states she thought it was her deodorant as she had some burning and she stopped, but she is still experiencing burning. Patient states she first noticed about six weeks ago. Patient states the burning sensation comes and goes.   ? ?UPPER BACK PAIN ?Present for 6 weeks.  To underneath both axilla.  Has stopped using deodorant and is still hurting.  No rashes presenting.  No recent injuries.  No changes in soap or hygiene products.  When gets up in morning right she can not reach back with right arm, feels twinge of pain with this.   ?Duration: weeks ?Location: under both axilla ?Onset: gradual ?Severity: 6/10 ?Quality: aching and burning ?Frequency: intermittent -- varies in time ?Radiation: none ?Aggravating factors: nothing ?Alleviating factors: nothing, goes away after awhile ?Status:  staying about the same ?Treatments attempted: stopping deodorant  ?Relief with NSAIDs?: no ?Nighttime pain:  no ?Paresthesias / decreased sensation:  no ?Bowel / bladder incontinence:  no ?Fevers:  no ?Dysuria / urinary frequency:  no  ? ?ANXIETY/STRESS ?Her husband passed away in [place] in January 27, 2021, was home visiting, very unexpected. ?Duration:stable ?Anxious mood: yes  ?Excessive worrying: yes ?Irritability: yes  ?Sweating: no ?Nausea: no ?Palpitations:no ?Hyperventilation: sometimes ?Panic attacks: yes ?Agoraphobia: no  ?Obscessions/compulsions: no ?Depressed mood: yes ?Depression screen [facility] 2/9 04/14/2021 11/16/2020 09/17/2020 05/10/2020 11/10/2019  ?Decreased Interest 3 0 0 0 0  ?Down,  Depressed, Hopeless 2 0 0 0 0  ?PHQ - 2 Score 5 0 0 0 0  ?Altered sleeping 3 0 0 0 0  ?Tired, decreased energy 3 0 0 0 0  ?Change in appetite 3 0 0 0 0  ?Feeling bad or failure about yourself  0 0 0 0 0  ?Trouble concentrating _0 0 0  ?Moving slowly or fidgety/restless 2 0 0 0 0  ?Suicidal thoughts 1 0 0 0 0  ?PHQ-9 Score _1 0 0  ?Difficult doing work/chores - Not difficult at all Not difficult at all - Not difficult at all  ?Some recent data might be hidden  ?Anhedonia: no ?Weight changes: no ?Insomnia: yes hard to fall asleep  ?Hypersomnia: no ?Fatigue/loss of energy: yes ?Feelings of worthlessness: yes ?Feelings of guilt: yes ?Impaired concentration/indecisiveness: no ?Suicidal ideations: no  ?Crying spells: no ?Recent Stressors/Life Changes: yes ?  Relationship problems: no ?  Family stress: no   ?  Financial stress: no  ?  Job stress: no  ?  Recent death/loss: yes  ?GAD 7 : Generalized Anxiety Score 04/14/2021 11/16/2020 06/22/2020 06/22/2020  ?Nervous, Anxious, on Edge 3 1 0 0  ?Control/stop worrying 3 0 1 0  ?Worry too much - different things _2 ?Trouble relaxing _3 ?Restless 2 0 2 2  ?Easily annoyed or irritable 3 0 0 -  ?Afraid - awful might happen - 1 0 -  ?  Total GAD 7 Score - 4 7 -  ?Anxiety Difficulty Somewhat difficult Not difficult at all - -  ? ? ?Relevant past medical, surgical, family and social history reviewed and updated as indicated. Interim medical history since our last visit reviewed. ?Allergies and medications reviewed and updated. ? ?Review of Systems  ?Constitutional:  Negative for activity change, appetite change, diaphoresis, fatigue and fever.  ?Respiratory:  Negative for cough, chest tightness and shortness of breath.   ?Cardiovascular:  Negative for chest pain, palpitations and leg swelling.  ?Gastrointestinal: Negative.   ?Musculoskeletal:  Positive for arthralgias.  ?Neurological: Negative.   ?Psychiatric/Behavioral:  Positive for sleep disturbance. Negative for  confusion, self-injury and suicidal ideas. The patient is nervous/anxious.   ? ?Per HPI unless specifically indicated above ? ?   ?Objective:  ?  ?BP 132/70   Pulse 90   Ht 5' 9" (1.753 m)   Wt 165 lb 3.2 oz (74.9 kg)   LMP  (LMP Unknown)   SpO2 99%   BMI 24.40 kg/m?   ?Wt Readings from Last 3 Encounters:  ?04/14/21 165 lb 3.2 oz (74.9 kg)  ?02/02/21 164 lb (74.4 kg)  ?11/16/20 173 lb 6 oz (78.6 kg)  ?  ?Physical Exam ?Vitals and nursing note reviewed.  ?Constitutional:   ?   General: She is awake. She is not in acute distress. ?   Appearance: She is well-developed. She is not ill-appearing.  ?HENT:  ?   Head: Normocephalic.  ?   Right Ear: Hearing and external ear normal. No drainage.  ?   Left Ear: Hearing and external ear normal. No drainage.  ?Eyes:  ?   General: Lids are normal.     ?   Right eye: No discharge.     ?   Left eye: No discharge.  ?   Conjunctiva/sclera: Conjunctivae normal.  ?   Pupils: Pupils are equal, round, and reactive to light.  ?Neck:  ?   Thyroid: No thyromegaly.  ?   Vascular: No carotid bruit or JVD.  ?Cardiovascular:  ?   Rate and Rhythm: Normal rate and regular rhythm.  ?   Heart sounds: Normal heart sounds. No murmur heard. ?  No gallop.  ?Pulmonary:  ?   Effort: Pulmonary effort is normal. No accessory muscle usage or respiratory distress.  ?   Breath sounds: Normal breath sounds.  ?Abdominal:  ?   General: Bowel sounds are normal.  ?   Palpations: Abdomen is soft.  ?Musculoskeletal:  ?   Right shoulder: Tenderness present. No swelling, effusion, laceration, bony tenderness or crepitus. Normal range of motion. Normal strength.  ?   Left shoulder: Tenderness present. No swelling, effusion, laceration, bony tenderness or crepitus. Normal range of motion. Normal strength.  ?   Cervical back: Normal range of motion and neck supple.  ?   Right lower leg: No edema.  ?   Left lower leg: No edema.  ?   Comments: Tender, mild, at attachment rotator cuff posterior lower shoulder.  No  decreased ROM.  ?Lymphadenopathy:  ?   Cervical: No cervical adenopathy.  ?Skin: ?   General: Skin is warm and dry.  ?Neurological:  ?   Mental Status: She is alert and oriented to person, place, and time.  ?Psychiatric:     ?   Attention and Perception: Attention normal.     ?   Mood and Affect: Mood normal.     ?   Speech: Speech normal.     ?  Behavior: Behavior normal. Behavior is cooperative.  ? ? ?Results for orders placed or performed in visit on 11/16/20  ?CBC with Differential/Platelet  ?Result Value Ref Range  ? WBC 4.6 3.4 - 10.8 x10E3/uL  ? RBC 4.87 3.77 - 5.28 x10E6/uL  ? Hemoglobin 13.3 11.1 - 15.9 g/dL  ? Hematocrit 41.7 34.0 - 46.6 %  ? MCV 86 79 - 97 fL  ? MCH 27.3 26.6 - 33.0 pg  ? MCHC 31.9 31.5 - 35.7 g/dL  ? RDW 14.1 11.7 - 15.4 %  ? Platelets 240 150 - 450 x10E3/uL  ? Neutrophils 49 Not Estab. %  ? Lymphs 39 Not Estab. %  ? Monocytes 10 Not Estab. %  ? Eos 1 Not Estab. %  ? Basos 1 Not Estab. %  ? Neutrophils Absolute 2.3 1.4 - 7.0 x10E3/uL  ? Lymphocytes Absolute 1.8 0.7 - 3.1 x10E3/uL  ? Monocytes Absolute 0.4 0.1 - 0.9 x10E3/uL  ? EOS (ABSOLUTE) 0.1 0.0 - 0.4 x10E3/uL  ? Basophils Absolute 0.0 0.0 - 0.2 x10E3/uL  ? Immature Granulocytes 0 Not Estab. %  ? Immature Grans (Abs) 0.0 0.0 - 0.1 x10E3/uL  ?Comprehensive metabolic panel  ?Result Value Ref Range  ? Glucose 70 70 - 99 mg/dL  ? BUN 13 8 - 27 mg/dL  ? Creatinine, Ser 0.96 0.57 - 1.00 mg/dL  ? eGFR 67 >59 mL/min/1.73  ? BUN/Creatinine Ratio 14 12 - 28  ? Sodium 141 134 - 144 mmol/L  ? Potassium 4.0 3.5 - 5.2 mmol/L  ? Chloride 103 96 - 106 mmol/L  ? CO2 25 20 - 29 mmol/L  ? Calcium 9.3 8.7 - 10.3 mg/dL  ? Total Protein 7.0 6.0 - 8.5 g/dL  ? Albumin 4.6 3.8 - 4.8 g/dL  ? Globulin, Total 2.4 1.5 - 4.5 g/dL  ? Albumin/Globulin Ratio 1.9 1.2 - 2.2  ? Bilirubin Total 0.4 0.0 - 1.2 mg/dL  ? Alkaline Phosphatase 78 44 - 121 IU/L  ? AST 21 0 - 40 IU/L  ? ALT 20 0 - 32 IU/L  ?Lipid Panel w/o Chol/HDL Ratio  ?Result Value Ref Range  ?  Cholesterol, Total 146 100 - 199 mg/dL  ? Triglycerides 68 0 - 149 mg/dL  ? HDL 50 >39 mg/dL  ? VLDL Cholesterol Cal 14 5 - 40 mg/dL  ? LDL Chol Calc (NIH) 82 0 - 99 mg/dL  ?TSH  ?Result Value Ref Range  ? TSH 0.717 0.450 - 4.500 uIU/mL

## 2021-04-14 NOTE — Assessment & Plan Note (Signed)
Chronic, exacerbated by recent loss of husband.  Denies SI/H.  Continue Celexa 40 MG daily at this time, but made her aware at 53 will need to reduce to 20 MG due to QT prolongation risk.  Will increase Buspar to 10 MG BID at this time.  Could consider direct switch in future to Sertraline at higher dose, but for now she has done well with Celexa.  Therapy referral placed and provided print out on local therapist.  Return in April. ?

## 2021-04-19 ENCOUNTER — Other Ambulatory Visit
Admission: RE | Admit: 2021-04-19 | Discharge: 2021-04-19 | Disposition: A | Discharge status: Home / Self Care | Payer: Commercial Managed Care - PPO | Source: Ambulatory Visit | Attending: Ophthalmology | Admitting: Ophthalmology

## 2021-04-19 DIAGNOSIS — H209 Unspecified iridocyclitis: Secondary | ICD-10-CM | POA: Diagnosis present

## 2021-04-20 ENCOUNTER — Other Ambulatory Visit
Admission: RE | Admit: 2021-04-20 | Discharge: 2021-04-20 | Disposition: A | Discharge status: Home / Self Care | Payer: Commercial Managed Care - PPO | Source: Ambulatory Visit | Attending: Ophthalmology | Admitting: Ophthalmology

## 2021-04-20 DIAGNOSIS — H209 Unspecified iridocyclitis: Secondary | ICD-10-CM | POA: Insufficient documentation

## 2021-04-20 LAB — RPR: RPR Ser Ql: NONREACTIVE

## 2021-04-22 LAB — ACETYLCHOLINE RECEPTOR AB, ALL
Acety choline binding ab: 0.14 nmol/L (ref 0.00–0.24)
Acetylchol Block Ab: 9 % (ref 0–25)

## 2021-04-23 LAB — QUANTIFERON-TB GOLD PLUS: QuantiFERON-TB Gold Plus: NEGATIVE

## 2021-04-23 LAB — QUANTIFERON-TB GOLD PLUS (RQFGPL)
QuantiFERON Mitogen Value: >10 IU/mL
QuantiFERON Nil Value: 0.04 IU/mL
QuantiFERON TB1 Ag Value: 0.03 IU/mL
QuantiFERON TB2 Ag Value: 0.04 IU/mL

## 2021-05-02 ENCOUNTER — Other Ambulatory Visit: Payer: Self-pay | Admitting: Nurse Practitioner

## 2021-05-03 NOTE — Telephone Encounter (Signed)
Requested Prescriptions  ?Pending Prescriptions Disp Refills  ?? atorvastatin (LIPITOR) 10 MG tablet [Pharmacy Med Name: ATORVASTATIN '10MG'$  TABLETS] 90 tablet 1  ?  Sig: TAKE 1 TABLET(10 MG) BY MOUTH DAILY  ?  ? Cardiovascular:  Antilipid - Statins Failed - 05/02/2021  3:54 PM  ?  ?  Failed - Lipid Panel in normal range within the last 12 months  ?  Cholesterol, Total  ?Date Value Ref Range Status  ?11/16/2020 146 100 - 199 mg/dL Final  ? ?Cholesterol [name], [name]  ?Date Value Ref Range Status  ?11/30/2014 228 (H) <200 mg/dL Final  ?  Comment:  ?                          Desirable                <200 ?                        Borderline High      200- 239 ?                        High                     >239 ?  ? ?LDL Chol Calc (NIH)  ?Date Value Ref Range Status  ?11/16/2020 82 0 - 99 mg/dL Final  ? ?HDL  ?Date Value Ref Range Status  ?11/16/2020 50 >39 mg/dL Final  ? ?Triglycerides  ?Date Value Ref Range Status  ?11/16/2020 68 0 - 149 mg/dL Final  ? ?Triglycerides [name],Waived  ?Date Value Ref Range Status  ?11/30/2014 122 <150 mg/dL Final  ?  Comment:  ?                          Normal                   <150 ?                        Borderline High     150 - 199 ?                        High                200 - 499 ?                        Very High                >499 ?  ? ?  ?  ?  Passed - Patient is not pregnant  ?  ?  Passed - Valid encounter within last 12 months  ?  Recent Outpatient Visits   ?      ? 2 weeks ago Major depressive disorder with single episode, in partial remission ([place])  ? [place], Cidra T, NP  ? 5 months ago Major depressive disorder with single episode, in partial remission ([place])  ? [place], [place] T, NP  ? 7 months ago Gastroesophageal reflux disease without esophagitis  ? [place], Roseland T, NP  ? 8 months ago Chest pain, unspecified type  ? [place], Daniel T, NP  ? 10 months ago Major  depressive disorder with single episode, in partial remission ([place])  ? [facility] [place], Henrine Screws T, NP  ?  ?  ?Future Appointments   ?        ? In 2 weeks [place], Barbaraann Faster, NP [facility]  ?  ? ?  ?  ?  ? ? ?

## 2021-05-05 ENCOUNTER — Ambulatory Visit
Admission: RE | Admit: 2021-05-05 | Discharge: 2021-05-05 | Disposition: A | Discharge status: Home / Self Care | Payer: Commercial Managed Care - PPO | Source: Ambulatory Visit | Attending: Ophthalmology | Admitting: Ophthalmology

## 2021-05-05 ENCOUNTER — Other Ambulatory Visit: Payer: Self-pay | Admitting: Ophthalmology

## 2021-05-05 DIAGNOSIS — H2013 Chronic iridocyclitis, bilateral: Secondary | ICD-10-CM | POA: Insufficient documentation

## 2021-05-05 DIAGNOSIS — H209 Unspecified iridocyclitis: Secondary | ICD-10-CM

## 2021-05-17 ENCOUNTER — Ambulatory Visit: Payer: Commercial Managed Care - PPO | Admitting: Nurse Practitioner

## 2021-05-18 ENCOUNTER — Ambulatory Visit (INDEPENDENT_AMBULATORY_CARE_PROVIDER_SITE_OTHER): Payer: Commercial Managed Care - PPO | Admitting: Obstetrics and Gynecology

## 2021-05-18 ENCOUNTER — Encounter: Payer: Self-pay | Admitting: Obstetrics and Gynecology

## 2021-05-18 VITALS — BP 151/83 | HR 59 | Ht 69.0 in | Wt 161.5 lb

## 2021-05-18 DIAGNOSIS — R102 Pelvic and perineal pain: Secondary | ICD-10-CM

## 2021-05-18 DIAGNOSIS — N8111 Cystocele, midline: Secondary | ICD-10-CM | POA: Diagnosis not present

## 2021-05-18 DIAGNOSIS — Z01818 Encounter for other preprocedural examination: Secondary | ICD-10-CM

## 2021-05-18 NOTE — Progress Notes (Signed)
Patient presents today for pre-op discussion of hysterectomy. She states her pelvic pressure is still causing significant pain. She states no other concerns at this time. ?

## 2021-05-18 NOTE — Progress Notes (Signed)
HPI: ?     [name] is a 61 y.o. [phone] who LMP was No LMP recorded (lmp unknown). Patient is postmenopausal. ? ?Subjective:  ? ?She presents today because she states she has decided upon surgery for her pelvic prolapse symptoms.  She mainly has pelvic pressure in the midline.  She occasionally loses urine with lifting coughing and sneezing. ? ?  Hx: ?The following portions of the patient's history were reviewed and updated as appropriate: ?            She  has a past medical history of Anxiety, Atrophic vaginitis, and Hyperlipidemia. ?She does not have any pertinent problems on file. ?She  has a past surgical history that includes Esophagogastroduodenoscopy (egd) with propofol (N/A, 10/15/2017); Colonoscopy with propofol (N/A, 10/15/2017); Breast biopsy (Left, 04/08/2015); Carpal tunnel release (Left, 10/28/2019); Cataract extraction w/PHACO (Right, 02/17/2020); and Cataract extraction w/PHACO (Left, 03/10/2020). ?Her family history includes Arthritis in her mother; Heart disease in her mother. ?She  reports that she has never smoked. She has never used smokeless tobacco. She reports that she does not currently use alcohol. She reports that she does not use drugs. ?She has a current medication list which includes the following prescription(s): atorvastatin, brimonidine tartrate, buspirone, cholecalciferol, citalopram, ketorolac, omeprazole, and prednisolone acetate. ?She has No Known Allergies. ?      ?Review of Systems:  ?Review of Systems ? ?Constitutional: Denied constitutional symptoms, night sweats, recent illness, fatigue, fever, insomnia and weight loss.  ?Eyes: Denied eye symptoms, eye pain, photophobia, vision change and visual disturbance.  ?Ears/Nose/Throat/Neck: Denied ear, nose, throat or neck symptoms, hearing loss, nasal discharge, sinus congestion and sore throat.  ?Cardiovascular: Denied cardiovascular symptoms, arrhythmia, chest pain/pressure, edema, exercise intolerance, orthopnea and  palpitations.  ?Respiratory: Denied pulmonary symptoms, asthma, pleuritic pain, productive sputum, cough, dyspnea and wheezing.  ?Gastrointestinal: Denied, gastro-esophageal reflux, melena, nausea and vomiting.  ?Genitourinary: See HPI for additional information.  ?Musculoskeletal: Denied musculoskeletal symptoms, stiffness, swelling, muscle weakness and myalgia.  ?Dermatologic: Denied dermatology symptoms, rash and scar.  ?Neurologic: Denied neurology symptoms, dizziness, headache, neck pain and syncope.  ?Psychiatric: Denied psychiatric symptoms, anxiety and depression.  ?Endocrine: Denied endocrine symptoms including hot flashes and night sweats.  ? ?Meds: ?  ?Current Outpatient Medications on File Prior to Visit  ?Medication Sig Dispense Refill  ? atorvastatin (LIPITOR) 10 MG tablet TAKE 1 TABLET(10 MG) BY MOUTH DAILY 90 tablet 1  ? Brimonidine Tartrate (LUMIFY OP) Apply to eye 2 (two) times daily.    ? busPIRone (BUSPAR) 10 MG tablet Take 1 tablet (10 mg total) by mouth 2 (two) times daily. 180 tablet 4  ? cholecalciferol (VITAMIN D) 25 MCG (1000 UNIT) tablet Take 1,000 Units by mouth daily.    ? citalopram (CELEXA) 40 MG tablet Take 1 tablet (40 mg total) by mouth daily. 90 tablet 4  ? ketorolac (ACULAR) 0.5 % ophthalmic solution 1 drop 4 (four) times daily.    ? omeprazole (PRILOSEC) 40 MG capsule Take 1 capsule (40 mg total) by mouth daily. 90 capsule 4  ? prednisoLONE acetate (PRED FORTE) 1 % ophthalmic suspension See admin instructions.    ? ?No current facility-administered medications on file prior to visit.  ? ? ? ? ?Objective:  ?  ? ?Vitals:  ? 05/18/21 0914  ?BP: (!) 151/83  ?Pulse: (!) 59  ? ?Filed Weights  ? 05/18/21 0914  ?Weight: 161 lb 8 oz (73.3 kg)  ? ?  ?          ?        ? ?  Assessment:  ?  ?[id number] ?Patient Active Problem List  ? Diagnosis Date Noted  ? Other female genital prolapse 04/13/2020  ? Elevated BP without diagnosis of hypertension 02/17/2019  ? Back pain 01/14/2019  ? Vitamin D  deficiency 08/21/2018  ? Prediabetes 03/10/2018  ? Neuropathy 12/17/2017  ? Tendinitis, de Quervain's 07/31/2017  ? GERD (gastroesophageal reflux disease) 07/31/2017  ? Depression 02/09/2016  ? Allergic rhinitis 11/30/2014  ? Anxiety 07/16/2014  ? Atrophic vaginitis 07/16/2014  ? Hyperlipidemia 07/16/2014  ? ?  ?1. Cystocele, midline   ?2. Pelvic pressure in female   ? ? Patient says her symptoms are no better and she has decided upon definitive surgery. ? ? ?Plan:  ?  ?       ? 1.  After discussing multiple surgical options it became apparent that patient required more time to consider her options. ? What we discussed in detail at this visit was the possibility of LAVH, anterior repair, urethral sling. ? Additional considerations that she would like time to think about include bilateral salpingectomy, bilateral oophorectomy. ? Although her physical examination does not currently show a rectocele I would likely list this as a possible and we have also discussed this. ? We have also discussed self-catheterization, leaving the hospital with a catheter, or staying overnight until she proves that she can void appropriately.  This is also something that she would like to consider. ? ?We have discussed all the above in some detail and all of her questions were answered.  I believe that she will be more comfortable considering this over the next few weeks before making her final decision. ?She will schedule a preop appointment when she desires. ?Orders ?No orders of the defined types were placed in this encounter. ? ? No orders of the defined types were placed in this encounter. ?  ?  F/U ? Return in about 2 weeks (around 06/01/2021). ?I spent 32 minutes involved in the care of this patient preparing to see the patient by obtaining and reviewing her medical history (including labs, imaging tests and prior procedures), documenting clinical information in the electronic health record (EHR), counseling and coordinating care  plans, writing and sending prescriptions, ordering tests or procedures and in direct communicating with the patient and medical staff discussing pertinent items from her history and physical exam. ? ?Finis Bud, M.D. ?05/18/2021 ?10:01 AM ? ? ? ? ?

## 2021-05-19 NOTE — Patient Instructions (Signed)

## 2021-05-20 ENCOUNTER — Ambulatory Visit (INDEPENDENT_AMBULATORY_CARE_PROVIDER_SITE_OTHER): Payer: Commercial Managed Care - PPO | Admitting: Nurse Practitioner

## 2021-05-20 ENCOUNTER — Encounter: Payer: Self-pay | Admitting: Nurse Practitioner

## 2021-05-20 VITALS — BP 115/70 | HR 64 | Temp 98.0°F | Ht 69.0 in | Wt 167.2 lb

## 2021-05-20 DIAGNOSIS — R7303 Prediabetes: Secondary | ICD-10-CM

## 2021-05-20 DIAGNOSIS — E782 Mixed hyperlipidemia: Secondary | ICD-10-CM | POA: Diagnosis not present

## 2021-05-20 DIAGNOSIS — K219 Gastro-esophageal reflux disease without esophagitis: Secondary | ICD-10-CM

## 2021-05-20 DIAGNOSIS — R030 Elevated blood-pressure reading, without diagnosis of hypertension: Secondary | ICD-10-CM

## 2021-05-20 DIAGNOSIS — F419 Anxiety disorder, unspecified: Secondary | ICD-10-CM

## 2021-05-20 DIAGNOSIS — E559 Vitamin D deficiency, unspecified: Secondary | ICD-10-CM

## 2021-05-20 DIAGNOSIS — F324 Major depressive disorder, single episode, in partial remission: Secondary | ICD-10-CM | POA: Diagnosis not present

## 2021-05-20 LAB — MICROALBUMIN, URINE WAIVED
Creatinine, Urine Waived: 100 mg/dL (ref 10–300)
Microalb, Ur Waived: 10 mg/L (ref 0–19)
Microalb/Creat Ratio: <30 mg/g (ref <30)

## 2021-05-20 LAB — BAYER DCA HB A1C WAIVED: HB A1C (BAYER DCA - WAIVED): 5.7 % — ABNORMAL HIGH (ref 4.8–5.6)

## 2021-05-20 MED ORDER — ATORVASTATIN CALCIUM 10 MG PO TABS: ORAL_TABLET | ORAL | 4 refills | Status: AC

## 2021-05-20 MED ORDER — OMEPRAZOLE 40 MG PO CPDR: 40.0000 mg | DELAYED_RELEASE_CAPSULE | Freq: Every day | ORAL | 4 refills | Status: DC

## 2021-05-20 MED ORDER — CITALOPRAM HYDROBROMIDE 40 MG PO TABS: 40.0000 mg | ORAL_TABLET | Freq: Every day | ORAL | 4 refills | Status: AC

## 2021-05-20 NOTE — Progress Notes (Signed)
? ?BP 115/70   Pulse 64   Temp 98 ?F (36.7 ?C) (Oral)   Ht '5\' 9"'$  (1.753 m)   Wt 167 lb 3.2 oz (75.8 kg)   LMP  (LMP Unknown)   SpO2 98%   BMI 24.69 kg/m?   ? ?Subjective:  ? ? Patient ID: Suzanne Scott, female    DOB: 1960-11-19, 61 y.o.   MRN: [id number] ? ?HPI: ?Suzanne Scott is a 61 y.o. female ? ?Chief Complaint  ?Patient presents with  ? Hyperlipidemia  ? Hypertension  ? Gastroesophageal Reflux  ? Prediabetes  ? Procedure  ?  Patient states she would like to discuss with provider at today's visit about upcoming surgery.   ? ?Has decided to have surgery for pelvic prolapse issues -- is going to have LAVH with oophorectomy, anterior repair, urethral sling -- is planning on having on May 15th.   ? ?HYPERTENSION / HYPERLIPIDEMIA ?No current medications for HTN, Atorvastatin for HLD. ? ?History of elevations in A1c, last in October 6.3%.  Has been focusing on diet changes + drinking more water. ?Satisfied with current treatment? yes ?Duration of hypertension: chronic ?BP monitoring frequency: not checking ?BP range:  ?Duration of hyperlipidemia: chronic ?Cholesterol medication side effects: yes ?Cholesterol supplements: none ?Medication compliance: good compliance ?Aspirin: no ?Recent stressors: no ?Recurrent headaches: no ?Visual changes: no ?Palpitations: no ?Dyspnea: no ?Chest pain: no ?Lower extremity edema: no ?Dizzy/lightheaded: no ? ?GERD ?Continues on Omeprazole as needed. ?GERD control status: stable ?Satisfied with current treatment? yes ?Heartburn frequency: occasional -- very rarely ?Medication side effects: no  ?Medication compliance: stable ?Previous GERD medications: TUMS ?Dysphagia: no ?Odynophagia:  no ?Hematemesis: no ?Blood in stool: no ?EGD: yes  ? ?ANXIETY/STRESS ?Continues on Celexa 40 MG daily + Buspar 10 MG BID.  Increased Buspar last visit with improvement in mood -- recent loss of husband months back.  She plans on moving to [place] in a couple months with her daughter.    ?Duration: stable ?Anxious mood: occasional ?Excessive worrying: no ?Irritability: no  ?Sweating: no ?Nausea: no ?Palpitations: improved ?Hyperventilation: no ?Panic attacks: no ?Agoraphobia: no  ?Obscessions/compulsions: no ?Depressed mood: no ? ?  05/20/2021  ?  3:32 PM 04/14/2021  ? 10:25 AM 11/16/2020  ?  3:54 PM 09/17/2020  ?  4:00 PM 05/10/2020  ?  4:08 PM  ?Depression screen PHQ 2/9  ?Decreased Interest 0 3 0 0 0  ?Down, Depressed, Hopeless 0 2 0 0 0  ?PHQ - 2 Score 0 5 0 0 0  ?Altered sleeping 0 3 0 0 0  ?Tired, decreased energy 0 3 0 0 0  ?Change in appetite 0 3 0 0 0  ?Feeling bad or failure about yourself  0 0 0 0 0  ?Trouble concentrating '2 3 2 1 '$ 0  ?Moving slowly or fidgety/restless 0 2 0 0 0  ?Suicidal thoughts 0 1 0 0 0  ?PHQ-9 Score '2 20 2 1 '$ 0  ?Difficult doing work/chores   Not difficult at all Not difficult at all   ?  ? ?  05/20/2021  ?  3:32 PM 04/14/2021  ? 10:25 AM 11/16/2020  ?  3:57 PM 06/22/2020  ?  4:05 PM  ?GAD 7 : Generalized Anxiety Score  ?Nervous, Anxious, on Edge '1 3 1 '$ 0  ?Control/stop worrying 0 3 0 1  ?Worry too much - different things '1 3 1 2  '$ ?Trouble relaxing '1 3 1 2  '$ ?Restless 1 2 0 2  ?  Easily annoyed or irritable 1 3 0 0  ?Afraid - awful might happen 2  1 0  ?Total GAD 7 Score '7  4 7  '$ ?Anxiety Difficulty  Somewhat difficult Not difficult at all   ? ? ?Relevant past medical, surgical, family and social history reviewed and updated as indicated. Interim medical history since our last visit reviewed. ?Allergies and medications reviewed and updated. ? ?Review of Systems  ?Constitutional:  Negative for activity change, appetite change, diaphoresis, fatigue and fever.  ?Respiratory:  Negative for cough, chest tightness and shortness of breath.   ?Cardiovascular:  Negative for chest pain, palpitations and leg swelling.  ?Gastrointestinal: Negative.   ?Neurological: Negative.   ?Psychiatric/Behavioral:  Negative for confusion, self-injury, sleep disturbance and suicidal ideas. The patient is  not nervous/anxious.   ? ?Per HPI unless specifically indicated above ? ?   ?Objective:  ?  ?BP 115/70   Pulse 64   Temp 98 ?F (36.7 ?C) (Oral)   Ht '5\' 9"'$  (1.753 m)   Wt 167 lb 3.2 oz (75.8 kg)   LMP  (LMP Unknown)   SpO2 98%   BMI 24.69 kg/m?   ?Wt Readings from Last 3 Encounters:  ?05/20/21 167 lb 3.2 oz (75.8 kg)  ?05/18/21 161 lb 8 oz (73.3 kg)  ?04/14/21 165 lb 3.2 oz (74.9 kg)  ?  ?Physical Exam ?Vitals and nursing note reviewed.  ?Constitutional:   ?   General: She is awake. She is not in acute distress. ?   Appearance: She is well-developed. She is not ill-appearing.  ?HENT:  ?   Head: Normocephalic.  ?   Right Ear: Hearing and external ear normal. No drainage.  ?   Left Ear: Hearing and external ear normal. No drainage.  ?Eyes:  ?   General: Lids are normal.     ?   Right eye: No discharge.     ?   Left eye: No discharge.  ?   Conjunctiva/sclera: Conjunctivae normal.  ?   Pupils: Pupils are equal, round, and reactive to light.  ?Neck:  ?   Thyroid: No thyromegaly.  ?   Vascular: No carotid bruit or JVD.  ?Cardiovascular:  ?   Rate and Rhythm: Normal rate and regular rhythm.  ?   Heart sounds: Normal heart sounds. No murmur heard. ?  No gallop.  ?Pulmonary:  ?   Effort: Pulmonary effort is normal. No accessory muscle usage or respiratory distress.  ?   Breath sounds: Normal breath sounds.  ?Abdominal:  ?   General: Bowel sounds are normal.  ?   Palpations: Abdomen is soft.  ?Musculoskeletal:  ?   Cervical back: Normal range of motion and neck supple.  ?   Right lower leg: No edema.  ?   Left lower leg: No edema.  ?Lymphadenopathy:  ?   Cervical: No cervical adenopathy.  ?Skin: ?   General: Skin is warm and dry.  ?Neurological:  ?   Mental Status: She is alert and oriented to person, place, and time.  ?Psychiatric:     ?   Attention and Perception: Attention normal.     ?   Mood and Affect: Mood normal.     ?   Speech: Speech normal.     ?   Behavior: Behavior normal. Behavior is cooperative.   ? ?Results for orders placed or performed in visit on 05/20/21  ?Bayer DCA Hb A1c Waived  ?Result Value Ref Range  ? HB A1C (BAYER DCA -  WAIVED) 5.7 (H) 4.8 - 5.6 %  ?Microalbumin, Urine Waived  ?Result Value Ref Range  ? Microalb, Ur Waived 10 0 - 19 mg/L  ? Creatinine, Urine Waived 100 10 - 300 mg/dL  ? Microalb/Creat Ratio <30 <30 mg/g  ? ?   ?Assessment & Plan:  ? ?Problem List Items Addressed This Visit   ? ?  ? Digestive  ? GERD (gastroesophageal reflux disease)  ?  Chronic, stable.  Continue Prilosec 40 MG daily.  Recent Mag level slightly elevated, recheck today.  Recommend she ensure not to lie down for 2-3 hours after eating, as can irritate symptoms.  Discussed long term use of PPI and recommend trial reductions in future. ? ?  ?  ? Relevant Medications  ? omeprazole (PRILOSEC) 40 MG capsule  ?  ? Other  ? Anxiety  ?  Refer to depression plan of care. ? ?  ?  ? Relevant Medications  ? citalopram (CELEXA) 40 MG tablet  ? Depression - Primary  ?  Chronic, improved.  Denies SI/H.  Continue Celexa 40 MG daily at this time, but made her aware at 20 will need to reduce to 20 MG due to QT prolongation risk.  Continue Buspar 10 MG BID at this time.  Could consider direct switch in future to Sertraline at higher dose, but for now she has done well with Celexa.  Therapy referral placed last visit.   ?  ?  ? Relevant Medications  ? citalopram (CELEXA) 40 MG tablet  ? Elevated BP without diagnosis of hypertension  ?  Ongoing with BP below goal today.  No current medications.  Continue focus on DASH diet and regular exercise.  Recommend she monitor BP at home daily and will continue collaboration with cardiology.  Initiate medication as needed -- would consider Losartan.  Urine ALB 10 today.  BMP today. ? ?  ?  ? Relevant Orders  ? Basic metabolic panel  ? Microalbumin, Urine Waived (Completed)  ? Hyperlipidemia  ?  Chronic, stable.  We will continue Atorvastatin 10 mg daily and adjust as needed.  Lipid check today.   ? ?  ?  ? Relevant Medications  ? atorvastatin (LIPITOR) 10 MG tablet  ? Other Relevant Orders  ? Lipid Panel w/o Chol/HDL Ratio  ? Prediabetes  ?  A1c trend down to 5.7% today, praised for this and diet changes.  Continue to

## 2021-05-20 NOTE — Assessment & Plan Note (Signed)
Chronic, improved.  Denies SI/H.  Continue Celexa 40 MG daily at this time, but made her aware at 103 will need to reduce to 20 MG due to QT prolongation risk.  Continue Buspar 10 MG BID at this time.  Could consider direct switch in future to Sertraline at higher dose, but for now she has done well with Celexa.  Therapy referral placed last visit.   ?

## 2021-05-20 NOTE — Assessment & Plan Note (Signed)
Chronic, stable.  Continue Prilosec 40 MG daily.  Recent Mag level slightly elevated, recheck today.  Recommend she ensure not to lie down for 2-3 hours after eating, as can irritate symptoms.  Discussed long term use of PPI and recommend trial reductions in future. ?

## 2021-05-20 NOTE — Assessment & Plan Note (Signed)
Refer to depression plan of care. 

## 2021-05-20 NOTE — Assessment & Plan Note (Signed)
Chronic, stable.  We will continue Atorvastatin 10 mg daily and adjust as needed.  Lipid check today.  

## 2021-05-20 NOTE — Assessment & Plan Note (Signed)
A1c trend down to 5.7% today, praised for this and diet changes.  Continue to monitor. ?

## 2021-05-20 NOTE — Assessment & Plan Note (Signed)
Ongoing with BP below goal today.  No current medications.  Continue focus on DASH diet and regular exercise.  Recommend she monitor BP at home daily and will continue collaboration with cardiology.  Initiate medication as needed -- would consider Losartan.  Urine ALB 10 today.  BMP today. ?

## 2021-05-21 LAB — BASIC METABOLIC PANEL
BUN/Creatinine Ratio: 18 (ref 12–28)
BUN: 17 mg/dL (ref 8–27)
CO2: 25 mmol/L (ref 20–29)
Calcium: 9.1 mg/dL (ref 8.7–10.3)
Chloride: 104 mmol/L (ref 96–106)
Creatinine, Ser: 0.95 mg/dL (ref 0.57–1.00)
Glucose: 100 mg/dL — ABNORMAL HIGH (ref 70–99)
Potassium: 3.7 mmol/L (ref 3.5–5.2)
Sodium: 142 mmol/L (ref 134–144)
eGFR: 68 mL/min/{1.73_m2} (ref >59)

## 2021-05-21 LAB — LIPID PANEL W/O CHOL/HDL RATIO
Cholesterol, Total: 156 mg/dL (ref 100–199)
HDL: 47 mg/dL (ref >39)
LDL Chol Calc (NIH): 88 mg/dL (ref 0–99)
Triglycerides: 117 mg/dL (ref 0–149)
VLDL Cholesterol Cal: 21 mg/dL (ref 5–40)

## 2021-05-21 LAB — MAGNESIUM: Magnesium: 2.2 mg/dL (ref 1.6–2.3)

## 2021-05-21 NOTE — Progress Notes (Signed)
Contacted via [facility] ? ? ?Good evening Lamika, your labs have returned and are overall stable this check.  No medication changes needed.  Have a wonderful weekend!! ?Keep being incredible!!  Thank you for allowing me to participate in your care.  I appreciate you. ?Kindest regards, ?Zennie Ayars ?

## 2021-06-01 ENCOUNTER — Ambulatory Visit (INDEPENDENT_AMBULATORY_CARE_PROVIDER_SITE_OTHER): Payer: Commercial Managed Care - PPO | Admitting: Obstetrics and Gynecology

## 2021-06-01 ENCOUNTER — Encounter: Payer: Self-pay | Admitting: Obstetrics and Gynecology

## 2021-06-01 VITALS — BP 117/76 | HR 66 | Ht 69.0 in | Wt 161.9 lb

## 2021-06-01 DIAGNOSIS — N8111 Cystocele, midline: Secondary | ICD-10-CM | POA: Diagnosis not present

## 2021-06-01 DIAGNOSIS — Z01818 Encounter for other preprocedural examination: Secondary | ICD-10-CM

## 2021-06-01 DIAGNOSIS — N393 Stress incontinence (female) (male): Secondary | ICD-10-CM

## 2021-06-01 DIAGNOSIS — R102 Pelvic and perineal pain: Secondary | ICD-10-CM | POA: Diagnosis not present

## 2021-06-01 DIAGNOSIS — N8189 Other female genital prolapse: Secondary | ICD-10-CM

## 2021-06-01 NOTE — Progress Notes (Signed)
Patient presents today for pre op. She states she has no current questions at this time.  ?

## 2021-06-01 NOTE — H&P (View-Only) (Signed)
? ?    PRE-OPERATIVE HISTORY AND PHYSICAL EXAM ? ?PCP:  Venita Lick, NP ?Subjective:  ? ?HPI:  Suzanne Scott is a 61 y.o. [id number].  No LMP recorded (lmp unknown). Patient is postmenopausal.  She presents today for a pre-op discussion and PE. ? ?She has the following symptoms: Pelvic prolapse with midline cystocele.  She has constant pelvic pressure.  She complains of urine loss with coughing lifting etc. ? ?Review of Systems:  ? ?Constitutional: Denied constitutional symptoms, night sweats, recent illness, fatigue, fever, insomnia and weight loss.  ?Eyes: Denied eye symptoms, eye pain, photophobia, vision change and visual disturbance.  ?Ears/Nose/Throat/Neck: Denied ear, nose, throat or neck symptoms, hearing loss, nasal discharge, sinus congestion and sore throat.  ?Cardiovascular: Denied cardiovascular symptoms, arrhythmia, chest pain/pressure, edema, exercise intolerance, orthopnea and palpitations.  ?Respiratory: Denied pulmonary symptoms, asthma, pleuritic pain, productive sputum, cough, dyspnea and wheezing.  ?Gastrointestinal: Denied, gastro-esophageal reflux, melena, nausea and vomiting.  ?Genitourinary: See HPI for additional information.  ?Musculoskeletal: Denied musculoskeletal symptoms, stiffness, swelling, muscle weakness and myalgia.  ?Dermatologic: Denied dermatology symptoms, rash and scar.  ?Neurologic: Denied neurology symptoms, dizziness, headache, neck pain and syncope.  ?Psychiatric: Denied psychiatric symptoms, anxiety and depression.  ?Endocrine: Denied endocrine symptoms including hot flashes and night sweats.  ? ?OB History  ?Gravida Para Term Preterm AB Living  ?'5 3 3   2 3  '$ ?SAB IAB Ectopic Multiple Live Births  ?  2     3  ?  ?# Outcome Date GA Lbr Len/2nd Weight Sex Delivery Anes PTL Lv  ?5 IAB 1992          ?4 Term 1989     Vag-Spont   LIV  ?3 Term 1984     Vag-Spont   LIV  ?2 IAB 1983          ?1 Term 1982     Vag-Spont   LIV  ? ? ?Past Medical History:  ?Diagnosis Date  ?  Anxiety   ? Atrophic vaginitis   ? Hyperlipidemia   ? ? ?Past Surgical History:  ?Procedure Laterality Date  ? BREAST BIOPSY Left 04/08/2015  ? negative  ? CARPAL TUNNEL RELEASE Left 10/28/2019  ? Procedure: CARPAL TUNNEL RELEASE ENDOSCOPIC;  Surgeon: Corky Mull, MD;  Location: [facility];  Service: Orthopedics;  Laterality: Left;  ? CATARACT EXTRACTION W/PHACO Right 02/17/2020  ? Procedure: CATARACT EXTRACTION PHACO AND INTRAOCULAR LENS PLACEMENT (IOC) RIGHT 4.93 00:41.7 11.8%;  Surgeon: Leandrew Koyanagi, MD;  Location: [facility];  Service: Ophthalmology;  Laterality: Right;  ? CATARACT EXTRACTION W/PHACO Left 03/10/2020  ? Procedure: CATARACT EXTRACTION PHACO AND INTRAOCULAR LENS PLACEMENT ([facility]) LEFT;  Surgeon: Leandrew Koyanagi, MD;  Location: [facility];  Service: Ophthalmology;  Laterality: Left;  3.29 ?1:01.3 ?5.4%  ? COLONOSCOPY WITH PROPOFOL N/A 10/15/2017  ? Procedure: COLONOSCOPY WITH PROPOFOL;  Surgeon: Lin Landsman, MD;  Location: [facility];  Service: Gastroenterology;  Laterality: N/A;  ? ESOPHAGOGASTRODUODENOSCOPY (EGD) WITH PROPOFOL N/A 10/15/2017  ? Procedure: ESOPHAGOGASTRODUODENOSCOPY (EGD) WITH PROPOFOL;  Surgeon: Lin Landsman, MD;  Location: [facility];  Service: Gastroenterology;  Laterality: N/A;  ?   ? ?SOCIAL HISTORY: ? ?Social History  ? ?Tobacco Use  ?Smoking Status Never  ?Smokeless Tobacco Never  ? ?Social History  ? ?Substance and Sexual Activity  ?Alcohol Use Not Currently  ? ? ?Social History  ? ?Substance and Sexual Activity  ?Drug Use No  ? ? ?Family History  ?Problem Relation  Age of Onset  ? Arthritis Mother   ? Heart disease Mother   ? Breast cancer Neg Hx   ? ? ?ALLERGIES:  Patient has no known allergies. ? ?MEDS: ?  ?Current Outpatient Medications on File Prior to Visit  ?Medication Sig Dispense Refill  ? atorvastatin (LIPITOR) 10 MG tablet TAKE 1 TABLET(10 MG) BY MOUTH DAILY 90 tablet 4  ? Brimonidine Tartrate (LUMIFY OP) Apply to eye 2  (two) times daily.    ? busPIRone (BUSPAR) 10 MG tablet Take 1 tablet (10 mg total) by mouth 2 (two) times daily. 180 tablet 4  ? cholecalciferol (VITAMIN D) 25 MCG (1000 UNIT) tablet Take 1,000 Units by mouth daily.    ? citalopram (CELEXA) 40 MG tablet Take 1 tablet (40 mg total) by mouth daily. 90 tablet 4  ? Cyanocobalamin (VITAMIN B-12 PO) Take 1 tablet by mouth 2 (two) times daily.    ? ketorolac (ACULAR) 0.5 % ophthalmic solution 1 drop 4 (four) times daily.    ? Multiple Vitamins-Minerals (MULTIPLE VITAMINS/WOMENS PO) Take by mouth.    ? prednisoLONE acetate (PRED FORTE) 1 % ophthalmic suspension See admin instructions.    ? ?No current facility-administered medications on file prior to visit.  ? ? ?No orders of the defined types were placed in this encounter. ?  ? ?Physical examination ?BP 117/76   Pulse 66   Ht '5\' 9"'$  (1.753 m)   Wt 161 lb 14.4 oz (73.4 kg)   LMP  (LMP Unknown)   BMI 23.91 kg/m?  ? ?General NAD, Conversant  ?HEENT Atraumatic; Op clear with mmm.  Normo-cephalic. Pupils reactive. Anicteric sclerae  ?Thyroid/Neck Smooth without nodularity or enlargement. Normal ROM.  Neck Supple.  ?Skin No rashes, lesions or ulceration. Normal palpated skin turgor. No nodularity.  ?Breasts: No masses or discharge.  Symmetric.  No axillary adenopathy.  ?Lungs: Clear to auscultation.No rales or wheezes. Normal Respiratory effort, no retractions.  ?Heart: NSR.  No murmurs or rubs appreciated. No periferal edema  ?Abdomen: Soft.  Non-tender.  No masses.  No HSM. No hernia  ?Extremities: Moves all appropriately.  Normal ROM for age. No lymphadenopathy.  ?Neuro: Oriented to PPT.  Normal mood. Normal affect.  ? ?  Pelvic:   ?Vulva: Normal appearance.  No lesions.  ?Vagina: No lesions or abnormalities noted.  ?Support: Third-degree midline cystocele, second-degree uterine prolapse  ?Urethra No masses tenderness or scarring.  ?Meatus Normal size without lesions or prolapse.  ?Cervix: Normal ectropion.  No lesions.   ?Anus: Normal exam.  No lesions.  ?Perineum: Normal exam.  No lesions.  ?      Bimanual   ?Uterus: Normal size.  Non-tender.  Mobile.  AV.  ?Adnexae: No masses.  Non-tender to palpation.  ?Cul-de-sac: Negative for abnormality.  ? ?Assessment:  ? ?[id number] ?Patient Active Problem List  ? Diagnosis Date Noted  ? Other female genital prolapse 04/13/2020  ? Elevated BP without diagnosis of hypertension 02/17/2019  ? Back pain 01/14/2019  ? Vitamin D deficiency 08/21/2018  ? Prediabetes 03/10/2018  ? Neuropathy 12/17/2017  ? Tendinitis, de Quervain's 07/31/2017  ? GERD (gastroesophageal reflux disease) 07/31/2017  ? Depression 02/09/2016  ? Allergic rhinitis 11/30/2014  ? Anxiety 07/16/2014  ? Atrophic vaginitis 07/16/2014  ? Hyperlipidemia 07/16/2014  ? ? ?1. Pre-op exam   ?2. Cystocele, midline   ?3. Pelvic relaxation   ?4. Pelvic pressure in female   ?5. SUI (stress urinary incontinence, female)   ? ? Significant pelvic pressure symptoms daily.  Stress urinary incontinence ? ? ?Plan:  ? ?Orders: ?No orders of the defined types were placed in this encounter. ?  ? ?1.  LAVH BSO anterior repair TOT possible posterior repair ? ? She would like to maintain sexual function after surgery ? She plans to learn to self cath so she can go home the same day. ? ?

## 2021-06-01 NOTE — Progress Notes (Signed)
? ?    PRE-OPERATIVE HISTORY AND PHYSICAL EXAM ? ?PCP:  Venita Lick, NP ?Subjective:  ? ?HPI:  Suzanne Scott is a 61 y.o. [id number].  No LMP recorded (lmp unknown). Patient is postmenopausal.  She presents today for a pre-op discussion and PE. ? ?She has the following symptoms: Pelvic prolapse with midline cystocele.  She has constant pelvic pressure.  She complains of urine loss with coughing lifting etc. ? ?Review of Systems:  ? ?Constitutional: Denied constitutional symptoms, night sweats, recent illness, fatigue, fever, insomnia and weight loss.  ?Eyes: Denied eye symptoms, eye pain, photophobia, vision change and visual disturbance.  ?Ears/Nose/Throat/Neck: Denied ear, nose, throat or neck symptoms, hearing loss, nasal discharge, sinus congestion and sore throat.  ?Cardiovascular: Denied cardiovascular symptoms, arrhythmia, chest pain/pressure, edema, exercise intolerance, orthopnea and palpitations.  ?Respiratory: Denied pulmonary symptoms, asthma, pleuritic pain, productive sputum, cough, dyspnea and wheezing.  ?Gastrointestinal: Denied, gastro-esophageal reflux, melena, nausea and vomiting.  ?Genitourinary: See HPI for additional information.  ?Musculoskeletal: Denied musculoskeletal symptoms, stiffness, swelling, muscle weakness and myalgia.  ?Dermatologic: Denied dermatology symptoms, rash and scar.  ?Neurologic: Denied neurology symptoms, dizziness, headache, neck pain and syncope.  ?Psychiatric: Denied psychiatric symptoms, anxiety and depression.  ?Endocrine: Denied endocrine symptoms including hot flashes and night sweats.  ? ?OB History  ?Gravida Para Term Preterm AB Living  ?'5 3 3   2 3  '$ ?SAB IAB Ectopic Multiple Live Births  ?  2     3  ?  ?# Outcome Date GA Lbr Len/2nd Weight Sex Delivery Anes PTL Lv  ?5 IAB 1992          ?4 Term 1989     Vag-Spont   LIV  ?3 Term 1984     Vag-Spont   LIV  ?2 IAB 1983          ?1 Term 1982     Vag-Spont   LIV  ? ? ?Past Medical History:  ?Diagnosis Date  ?  Anxiety   ? Atrophic vaginitis   ? Hyperlipidemia   ? ? ?Past Surgical History:  ?Procedure Laterality Date  ? BREAST BIOPSY Left 04/08/2015  ? negative  ? CARPAL TUNNEL RELEASE Left 10/28/2019  ? Procedure: CARPAL TUNNEL RELEASE ENDOSCOPIC;  Surgeon: Corky Mull, MD;  Location: [facility];  Service: Orthopedics;  Laterality: Left;  ? CATARACT EXTRACTION W/PHACO Right 02/17/2020  ? Procedure: CATARACT EXTRACTION PHACO AND INTRAOCULAR LENS PLACEMENT (IOC) RIGHT 4.93 00:41.7 11.8%;  Surgeon: Leandrew Koyanagi, MD;  Location: [place];  Service: Ophthalmology;  Laterality: Right;  ? CATARACT EXTRACTION W/PHACO Left 03/10/2020  ? Procedure: CATARACT EXTRACTION PHACO AND INTRAOCULAR LENS PLACEMENT ([place]) LEFT;  Surgeon: Leandrew Koyanagi, MD;  Location: [place];  Service: Ophthalmology;  Laterality: Left;  3.29 ?1:01.3 ?5.4%  ? COLONOSCOPY WITH PROPOFOL N/A 10/15/2017  ? Procedure: COLONOSCOPY WITH PROPOFOL;  Surgeon: Lin Landsman, MD;  Location: [facility] ENDOSCOPY;  Service: Gastroenterology;  Laterality: N/A;  ? ESOPHAGOGASTRODUODENOSCOPY (EGD) WITH PROPOFOL N/A 10/15/2017  ? Procedure: ESOPHAGOGASTRODUODENOSCOPY (EGD) WITH PROPOFOL;  Surgeon: Lin Landsman, MD;  Location: [facility]COPY;  Service: Gastroenterology;  Laterality: N/A;  ?   ? ?SOCIAL HISTORY: ? ?Social History  ? ?Tobacco Use  ?Smoking Status Never  ?Smokeless Tobacco Never  ? ?Social History  ? ?Substance and Sexual Activity  ?Alcohol Use Not Currently  ? ? ?Social History  ? ?Substance and Sexual Activity  ?Drug Use No  ? ? ?Family History  ?Problem Relation  Age of Onset  ? Arthritis Mother   ? Heart disease Mother   ? Breast cancer Neg Hx   ? ? ?ALLERGIES:  Patient has no known allergies. ? ?MEDS: ?  ?Current Outpatient Medications on File Prior to Visit  ?Medication Sig Dispense Refill  ? atorvastatin (LIPITOR) 10 MG tablet TAKE 1 TABLET(10 MG) BY MOUTH DAILY 90 tablet 4  ? Brimonidine Tartrate (LUMIFY OP) Apply to eye 2  (two) times daily.    ? busPIRone (BUSPAR) 10 MG tablet Take 1 tablet (10 mg total) by mouth 2 (two) times daily. 180 tablet 4  ? cholecalciferol (VITAMIN D) 25 MCG (1000 UNIT) tablet Take 1,000 Units by mouth daily.    ? citalopram (CELEXA) 40 MG tablet Take 1 tablet (40 mg total) by mouth daily. 90 tablet 4  ? Cyanocobalamin (VITAMIN B-12 PO) Take 1 tablet by mouth 2 (two) times daily.    ? ketorolac (ACULAR) 0.5 % ophthalmic solution 1 drop 4 (four) times daily.    ? Multiple Vitamins-Minerals (MULTIPLE VITAMINS/WOMENS PO) Take by mouth.    ? prednisoLONE acetate (PRED FORTE) 1 % ophthalmic suspension See admin instructions.    ? ?No current facility-administered medications on file prior to visit.  ? ? ?No orders of the defined types were placed in this encounter. ?  ? ?Physical examination ?BP 117/76   Pulse 66   Ht '5\' 9"'$  (1.753 m)   Wt 161 lb 14.4 oz (73.4 kg)   LMP  (LMP Unknown)   BMI 23.91 kg/m?  ? ?General NAD, Conversant  ?HEENT Atraumatic; Op clear with mmm.  Normo-cephalic. Pupils reactive. Anicteric sclerae  ?Thyroid/Neck Smooth without nodularity or enlargement. Normal ROM.  Neck Supple.  ?Skin No rashes, lesions or ulceration. Normal palpated skin turgor. No nodularity.  ?Breasts: No masses or discharge.  Symmetric.  No axillary adenopathy.  ?Lungs: Clear to auscultation.No rales or wheezes. Normal Respiratory effort, no retractions.  ?Heart: NSR.  No murmurs or rubs appreciated. No periferal edema  ?Abdomen: Soft.  Non-tender.  No masses.  No HSM. No hernia  ?Extremities: Moves all appropriately.  Normal ROM for age. No lymphadenopathy.  ?Neuro: Oriented to PPT.  Normal mood. Normal affect.  ? ?  Pelvic:   ?Vulva: Normal appearance.  No lesions.  ?Vagina: No lesions or abnormalities noted.  ?Support: Third-degree midline cystocele, second-degree uterine prolapse  ?Urethra No masses tenderness or scarring.  ?Meatus Normal size without lesions or prolapse.  ?Cervix: Normal ectropion.  No lesions.   ?Anus: Normal exam.  No lesions.  ?Perineum: Normal exam.  No lesions.  ?      Bimanual   ?Uterus: Normal size.  Non-tender.  Mobile.  AV.  ?Adnexae: No masses.  Non-tender to palpation.  ?Cul-de-sac: Negative for abnormality.  ? ?Assessment:  ? ?[id number] ?Patient Active Problem List  ? Diagnosis Date Noted  ? Other female genital prolapse 04/13/2020  ? Elevated BP without diagnosis of hypertension 02/17/2019  ? Back pain 01/14/2019  ? Vitamin D deficiency 08/21/2018  ? Prediabetes 03/10/2018  ? Neuropathy 12/17/2017  ? Tendinitis, de Quervain's 07/31/2017  ? GERD (gastroesophageal reflux disease) 07/31/2017  ? Depression 02/09/2016  ? Allergic rhinitis 11/30/2014  ? Anxiety 07/16/2014  ? Atrophic vaginitis 07/16/2014  ? Hyperlipidemia 07/16/2014  ? ? ?1. Pre-op exam   ?2. Cystocele, midline   ?3. Pelvic relaxation   ?4. Pelvic pressure in female   ?5. SUI (stress urinary incontinence, female)   ? ? Significant pelvic pressure symptoms daily.  Stress urinary incontinence ? ? ?Plan:  ? ?Orders: ?No orders of the defined types were placed in this encounter. ?  ? ?1.  LAVH BSO anterior repair TOT possible posterior repair ? ?Pre-op discussions regarding Risks and Benefits of her scheduled surgery. ? ?LAVH ?The procedure of Laparoscopic Assisted Vaginal Hysterectomy was described to the patient in detail.  We reviewed the rationale for Hysterectomy and the patient was again informed of other nonsurgical management possibilities for her condition.  She has considered these other options, and desires a Hysterectomy.  We have reviewed the fact that Hysterectomy is permanent and that following the procedure she will not be able to become pregnant or bear children.  We have discussed the following risk factors specifically and the patient has also been informed that additional complications not mentioned may develop:  Damage to bowel, bladder, ureters or to other internal organs, bleeding, infection and the risk from  anesthesia.  We have discussed the procedure itself in detail and she has an informed understanding of this surgery.  We have also discussed the recovery period in which physical and sexual activity will be rest

## 2021-06-01 NOTE — H&P (Signed)
? ?    PRE-OPERATIVE HISTORY AND PHYSICAL EXAM ? ?PCP:  Venita Lick, NP ?Subjective:  ? ?HPI:  Suzanne Scott is a 61 y.o. [id number].  No LMP recorded (lmp unknown). Patient is postmenopausal.  She presents today for a pre-op discussion and PE. ? ?She has the following symptoms: Pelvic prolapse with midline cystocele.  She has constant pelvic pressure.  She complains of urine loss with coughing lifting etc. ? ?Review of Systems:  ? ?Constitutional: Denied constitutional symptoms, night sweats, recent illness, fatigue, fever, insomnia and weight loss.  ?Eyes: Denied eye symptoms, eye pain, photophobia, vision change and visual disturbance.  ?Ears/Nose/Throat/Neck: Denied ear, nose, throat or neck symptoms, hearing loss, nasal discharge, sinus congestion and sore throat.  ?Cardiovascular: Denied cardiovascular symptoms, arrhythmia, chest pain/pressure, edema, exercise intolerance, orthopnea and palpitations.  ?Respiratory: Denied pulmonary symptoms, asthma, pleuritic pain, productive sputum, cough, dyspnea and wheezing.  ?Gastrointestinal: Denied, gastro-esophageal reflux, melena, nausea and vomiting.  ?Genitourinary: See HPI for additional information.  ?Musculoskeletal: Denied musculoskeletal symptoms, stiffness, swelling, muscle weakness and myalgia.  ?Dermatologic: Denied dermatology symptoms, rash and scar.  ?Neurologic: Denied neurology symptoms, dizziness, headache, neck pain and syncope.  ?Psychiatric: Denied psychiatric symptoms, anxiety and depression.  ?Endocrine: Denied endocrine symptoms including hot flashes and night sweats.  ? ?OB History  ?Gravida Para Term Preterm AB Living  ?'5 3 3   2 3  '$ ?SAB IAB Ectopic Multiple Live Births  ?  2     3  ?  ?# Outcome Date GA Lbr Len/2nd Weight Sex Delivery Anes PTL Lv  ?5 IAB 1992          ?4 Term 1989     Vag-Spont   LIV  ?3 Term 1984     Vag-Spont   LIV  ?2 IAB 1983          ?1 Term 1982     Vag-Spont   LIV  ? ? ?Past Medical History:  ?Diagnosis Date  ?  Anxiety   ? Atrophic vaginitis   ? Hyperlipidemia   ? ? ?Past Surgical History:  ?Procedure Laterality Date  ? BREAST BIOPSY Left 04/08/2015  ? negative  ? CARPAL TUNNEL RELEASE Left 10/28/2019  ? Procedure: CARPAL TUNNEL RELEASE ENDOSCOPIC;  Surgeon: Corky Mull, MD;  Location: [facility];  Service: Orthopedics;  Laterality: Left;  ? CATARACT EXTRACTION W/PHACO Right 02/17/2020  ? Procedure: CATARACT EXTRACTION PHACO AND INTRAOCULAR LENS PLACEMENT (IOC) RIGHT 4.93 00:41.7 11.8%;  Surgeon: Leandrew Koyanagi, MD;  Location: [place];  Service: Ophthalmology;  Laterality: Right;  ? CATARACT EXTRACTION W/PHACO Left 03/10/2020  ? Procedure: CATARACT EXTRACTION PHACO AND INTRAOCULAR LENS PLACEMENT ([place]) LEFT;  Surgeon: Leandrew Koyanagi, MD;  Location: [place];  Service: Ophthalmology;  Laterality: Left;  3.29 ?1:01.3 ?5.4%  ? COLONOSCOPY WITH PROPOFOL N/A 10/15/2017  ? Procedure: COLONOSCOPY WITH PROPOFOL;  Surgeon: Lin Landsman, MD;  Location: [facility]OSCOPY;  Service: Gastroenterology;  Laterality: N/A;  ? ESOPHAGOGASTRODUODENOSCOPY (EGD) WITH PROPOFOL N/A 10/15/2017  ? Procedure: ESOPHAGOGASTRODUODENOSCOPY (EGD) WITH PROPOFOL;  Surgeon: Lin Landsman, MD;  Location: [facility] ENDOSCOPY;  Service: Gastroenterology;  Laterality: N/A;  ?   ? ?SOCIAL HISTORY: ? ?Social History  ? ?Tobacco Use  ?Smoking Status Never  ?Smokeless Tobacco Never  ? ?Social History  ? ?Substance and Sexual Activity  ?Alcohol Use Not Currently  ? ? ?Social History  ? ?Substance and Sexual Activity  ?Drug Use No  ? ? ?Family History  ?Problem Relation  Age of Onset  ? Arthritis Mother   ? Heart disease Mother   ? Breast cancer Neg Hx   ? ? ?ALLERGIES:  Patient has no known allergies. ? ?MEDS: ?  ?Current Outpatient Medications on File Prior to Visit  ?Medication Sig Dispense Refill  ? atorvastatin (LIPITOR) 10 MG tablet TAKE 1 TABLET(10 MG) BY MOUTH DAILY 90 tablet 4  ? Brimonidine Tartrate (LUMIFY OP) Apply to eye 2  (two) times daily.    ? busPIRone (BUSPAR) 10 MG tablet Take 1 tablet (10 mg total) by mouth 2 (two) times daily. 180 tablet 4  ? cholecalciferol (VITAMIN D) 25 MCG (1000 UNIT) tablet Take 1,000 Units by mouth daily.    ? citalopram (CELEXA) 40 MG tablet Take 1 tablet (40 mg total) by mouth daily. 90 tablet 4  ? Cyanocobalamin (VITAMIN B-12 PO) Take 1 tablet by mouth 2 (two) times daily.    ? ketorolac (ACULAR) 0.5 % ophthalmic solution 1 drop 4 (four) times daily.    ? Multiple Vitamins-Minerals (MULTIPLE VITAMINS/WOMENS PO) Take by mouth.    ? prednisoLONE acetate (PRED FORTE) 1 % ophthalmic suspension See admin instructions.    ? ?No current facility-administered medications on file prior to visit.  ? ? ?No orders of the defined types were placed in this encounter. ?  ? ?Physical examination ?BP 117/76   Pulse 66   Ht '5\' 9"'$  (1.753 m)   Wt 161 lb 14.4 oz (73.4 kg)   LMP  (LMP Unknown)   BMI 23.91 kg/m?  ? ?General NAD, Conversant  ?HEENT Atraumatic; Op clear with mmm.  Normo-cephalic. Pupils reactive. Anicteric sclerae  ?Thyroid/Neck Smooth without nodularity or enlargement. Normal ROM.  Neck Supple.  ?Skin No rashes, lesions or ulceration. Normal palpated skin turgor. No nodularity.  ?Breasts: No masses or discharge.  Symmetric.  No axillary adenopathy.  ?Lungs: Clear to auscultation.No rales or wheezes. Normal Respiratory effort, no retractions.  ?Heart: NSR.  No murmurs or rubs appreciated. No periferal edema  ?Abdomen: Soft.  Non-tender.  No masses.  No HSM. No hernia  ?Extremities: Moves all appropriately.  Normal ROM for age. No lymphadenopathy.  ?Neuro: Oriented to PPT.  Normal mood. Normal affect.  ? ?  Pelvic:   ?Vulva: Normal appearance.  No lesions.  ?Vagina: No lesions or abnormalities noted.  ?Support: Third-degree midline cystocele, second-degree uterine prolapse  ?Urethra No masses tenderness or scarring.  ?Meatus Normal size without lesions or prolapse.  ?Cervix: Normal ectropion.  No lesions.   ?Anus: Normal exam.  No lesions.  ?Perineum: Normal exam.  No lesions.  ?      Bimanual   ?Uterus: Normal size.  Non-tender.  Mobile.  AV.  ?Adnexae: No masses.  Non-tender to palpation.  ?Cul-de-sac: Negative for abnormality.  ? ?Assessment:  ? ?[id number] ?Patient Active Problem List  ? Diagnosis Date Noted  ? Other female genital prolapse 04/13/2020  ? Elevated BP without diagnosis of hypertension 02/17/2019  ? Back pain 01/14/2019  ? Vitamin D deficiency 08/21/2018  ? Prediabetes 03/10/2018  ? Neuropathy 12/17/2017  ? Tendinitis, de Quervain's 07/31/2017  ? GERD (gastroesophageal reflux disease) 07/31/2017  ? Depression 02/09/2016  ? Allergic rhinitis 11/30/2014  ? Anxiety 07/16/2014  ? Atrophic vaginitis 07/16/2014  ? Hyperlipidemia 07/16/2014  ? ? ?1. Pre-op exam   ?2. Cystocele, midline   ?3. Pelvic relaxation   ?4. Pelvic pressure in female   ?5. SUI (stress urinary incontinence, female)   ? ? Significant pelvic pressure symptoms daily.  Stress urinary incontinence ? ? ?Plan:  ? ?Orders: ?No orders of the defined types were placed in this encounter. ?  ? ?1.  LAVH BSO anterior repair TOT possible posterior repair ? ? She would like to maintain sexual function after surgery ? She plans to learn to self cath so she can go home the same day. ? ?

## 2021-06-06 ENCOUNTER — Encounter
Admission: RE | Admit: 2021-06-06 | Discharge: 2021-06-06 | Disposition: A | Discharge status: Home / Self Care | Payer: Commercial Managed Care - PPO | Source: Ambulatory Visit | Attending: Obstetrics and Gynecology | Admitting: Obstetrics and Gynecology

## 2021-06-06 ENCOUNTER — Other Ambulatory Visit: Payer: Self-pay

## 2021-06-06 NOTE — Patient Instructions (Addendum)
Your procedure is scheduled on: Monday 06/13/21 ?Report to the Registration Desk on the 1st floor of the [place]. ?To find out your arrival time, please call [phone] between 1PM - 3PM on: Friday 06/10/21 ?If your arrival time is 6:00 am, do not arrive prior to that time as the [facility] entrance doors do not open until 6:00 am. ? ?REMEMBER: ?Instructions that are not followed completely may result in serious medical risk, up to and including death; or upon the discretion of your surgeon and anesthesiologist your surgery may need to be rescheduled. ? ?Do not eat or drink after midnight the night before surgery.  ?No gum chewing, lozengers or hard candies. ? ?TAKE THESE MEDICATIONS THE MORNING OF SURGERY WITH A SIP OF WATER: ?busPIRone (BUSPAR) 10 MG tablet ?citalopram (CELEXA) 40 MG tablet ? ?One week prior to surgery: ?Stop Anti-inflammatories (NSAIDS) such as Advil, Aleve, Ibuprofen, Motrin, Naproxen, Naprosyn and Aspirin based products such as Excedrin, Goodys Powder, BC Powder. ? ?Stop taking your cholecalciferol (VITAMIN D) 25 MCG (1000 UNIT) tablet, Cyanocobalamin (VITAMIN B-12 PO), ELDERBERRY PO, Multiple Vitamins-Minerals (MULTIPLE VITAMINS/WOMENS PO), and ANY OVER THE COUNTER supplements until after surgery. ? ?You may however, continue to take Tylenol if needed for pain up until the day of surgery. ? ?No Alcohol for 24 hours before or after surgery. ? ?No Smoking including e-cigarettes for 24 hours prior to surgery.  ?No chewable tobacco products for at least 6 hours prior to surgery.  ?No nicotine patches on the day of surgery. ? ?Do not use any "recreational" drugs for at least a week prior to your surgery.  ?Please be advised that the combination of cocaine and anesthesia may have negative outcomes, up to and including death. ?If you test positive for cocaine, your surgery will be cancelled. ? ?On the morning of surgery brush your teeth with toothpaste and water, you may rinse your mouth  with mouthwash if you wish. ?Do not swallow any toothpaste or mouthwash. ? ?Use CHG Soap as directed on instruction sheet. ? ?Do not wear jewelry, make-up, hairpins, clips or nail polish. ? ?Do not wear lotions, powders, or perfumes.  ? ?Do not shave body from the neck down 48 hours prior to surgery just in case you cut yourself which could leave a site for infection.  ?Also, freshly shaved skin may become irritated if using the CHG soap. ? ?Do not bring valuables to the hospital. [facility] is not responsible for any missing/lost belongings or valuables.  ? ?Notify your doctor if there is any change in your medical condition (cold, fever, infection). ? ?Wear comfortable clothing (specific to your surgery type) to the hospital. ? ?After surgery, you can help prevent lung complications by doing breathing exercises.  ?Take deep breaths and cough every 1-2 hours.  ? ?When coughing or sneezing, hold a pillow firmly against your incision with both hands. This is called ?splinting.? Doing this helps protect your incision. It also decreases belly discomfort. ? ?If you are being admitted to the hospital overnight, leave your suitcase in the car. ?After surgery it may be brought to your room. ? ?If you are being discharged the day of surgery, you will not be allowed to drive home. ?You will need a responsible adult (18 years or older) to drive you home and stay with you that night.  ? ?If you are taking public transportation, you will need to have a responsible adult (18 years or older) with you. ?Please confirm with your physician  that it is acceptable to use public transportation.  ? ?Please call the [facility] Dept. at [phone] if you have any questions about these instructions. ? ?Surgery Visitation Policy: ? ?Patients undergoing a surgery or procedure may have two family members or support persons with them as long as the person is not COVID-19 positive or experiencing its symptoms.  ? ?Inpatient  Visitation:   ? ?Visiting hours are 7 a.m. to 8 p.m. ?Up to four visitors are allowed at one time in a patient room, including children. The visitors may rotate out with other people during the day. One designated support person (adult) may remain overnight.  ?

## 2021-06-08 ENCOUNTER — Encounter
Admission: RE | Admit: 2021-06-08 | Discharge: 2021-06-08 | Disposition: A | Discharge status: Home / Self Care | Payer: Commercial Managed Care - PPO | Source: Ambulatory Visit | Attending: Obstetrics and Gynecology | Admitting: Obstetrics and Gynecology

## 2021-06-08 DIAGNOSIS — Z01818 Encounter for other preprocedural examination: Secondary | ICD-10-CM

## 2021-06-08 DIAGNOSIS — Z01812 Encounter for preprocedural laboratory examination: Secondary | ICD-10-CM | POA: Diagnosis present

## 2021-06-08 LAB — TYPE AND SCREEN
ABO/RH(D): AB POS
Antibody Screen: NEGATIVE

## 2021-06-11 NOTE — Anesthesia Preprocedure Evaluation (Addendum)
Anesthesia Evaluation  ?Patient identified by MRN, date of birth, ID band ?Patient awake ? ? ? ?Reviewed: ?Allergy & Precautions, H&P , NPO status , Patient's Chart, lab work & pertinent test results ? ?Airway ?Mallampati: II ? ?TM Distance: >3 FB ?Neck ROM: full ? ? ? Dental ?no notable dental hx. ? ?  ?Pulmonary ?neg pulmonary ROS,  ?  ?Pulmonary exam normal ?breath sounds clear to auscultation ? ? ? ? ? ? Cardiovascular ?Exercise Tolerance: Good ?Normal cardiovascular exam ?Rhythm:regular Rate:Normal ? ?HLD ?  ?Neuro/Psych ?PSYCHIATRIC DISORDERS Anxiety Depression negative neurological ROS ?   ? GI/Hepatic ?Neg liver ROS, GERD  Controlled,  ?Endo/Other  ?negative endocrine ROS ? Renal/GU ?negative Renal ROS  ?negative genitourinary ?  ?Musculoskeletal ?negative musculoskeletal ROS ?(+)  ? Abdominal ?(+) - obese,  ?Abdomen: soft. ? ?  ?Peds ? Hematology ?negative hematology ROS ?(+)   ?Anesthesia Other Findings ?Pelvic Prolapse Symptoms, Stress Urinary incontinence ? Reproductive/Obstetrics ?negative OB ROS ? ?  ? ? ? ? ? ? ? ? ? ? ? ? ? ?  ?  ? ? ? ? ? ? ? ?Anesthesia Physical ? ?Anesthesia Plan ? ?ASA: 2 ? ?Anesthesia Plan: General  ? ?Post-op Pain Management: Tylenol PO (pre-op)*, Toradol IV (intra-op)* and Gabapentin PO (pre-op)*  ? ?Induction: Intravenous ? ?PONV Risk Score and Plan: 4 or greater and Ondansetron, Dexamethasone, Midazolam and Diphenhydramine ? ?Airway Management Planned: Oral ETT ? ?Additional Equipment:  ? ?Intra-op Plan:  ? ?Post-operative Plan: Extubation in OR ? ?Informed Consent: I have reviewed the patients History and Physical, chart, labs and discussed the procedure including the risks, benefits and alternatives for the proposed anesthesia with the patient or authorized representative who has indicated his/her understanding and acceptance.  ? ? ? ?Dental advisory given ? ?Plan Discussed with: Surgeon and CRNA ? ?Anesthesia Plan Comments:    ? ? ? ? ?Anesthesia Quick Evaluation ? ?Patient Active Problem List  ? Diagnosis Date Noted  ?? Other female genital prolapse 04/13/2020  ?? Elevated BP without diagnosis of hypertension 02/17/2019  ?? Back pain 01/14/2019  ?? Vitamin D deficiency 08/21/2018  ?? Prediabetes 03/10/2018  ?? Neuropathy 12/17/2017  ?? Tendinitis, de Quervain's 07/31/2017  ?? GERD (gastroesophageal reflux disease) 07/31/2017  ?? Depression 02/09/2016  ?? Allergic rhinitis 11/30/2014  ?? Anxiety 07/16/2014  ?? Atrophic vaginitis 07/16/2014  ?? Hyperlipidemia 07/16/2014  ? ? ? ?  Latest Ref Rng & Units 11/16/2020  ?  4:15 PM 11/10/2019  ?  1:42 PM 02/17/2019  ?  9:47 AM  ?CBC  ?WBC 3.4 - 10.8 x10E3/uL 4.6   4.3   3.6    ?Hemoglobin 11.1 - 15.9 g/dL 13.3   13.8   13.2    ?Hematocrit 34.0 - 46.6 % 41.7   44.0   40.8    ?Platelets 150 - 450 x10E3/uL 240   259   302    ? ? ?  Latest Ref Rng & Units 05/20/2021  ?  3:34 PM 11/16/2020  ?  4:15 PM 05/10/2020  ?  4:18 PM  ?BMP  ?Glucose 70 - 99 mg/dL 100   70   85    ?BUN 8 - 27 mg/dL '17   13   13    '$ ?Creatinine 0.57 - 1.00 mg/dL 0.95   0.96   0.90    ?BUN/Creat Ratio 12 - '28 18   14   14    '$ ?Sodium 134 - 144 mmol/L 142   141  142    ?Potassium 3.5 - 5.2 mmol/L 3.7   4.0   3.8    ?Chloride 96 - 106 mmol/L 104   103   102    ?CO2 20 - 29 mmol/L '25   25   24    '$ ?Calcium 8.7 - 10.3 mg/dL 9.1   9.3   9.4    ? ? ?Risks and benefits of anesthesia discussed at length, patient or surrogate demonstrates understanding. Appropriately NPO. Plan to proceed with anesthesia. ? ?Champ Mungo, MD ?06/11/21 ? ? ?

## 2021-06-13 ENCOUNTER — Encounter
Admission: RE | Disposition: A | Discharge status: Home / Self Care | Payer: Self-pay | Source: Home / Self Care | Attending: Obstetrics and Gynecology

## 2021-06-13 ENCOUNTER — Encounter: Payer: Self-pay | Admitting: Obstetrics and Gynecology

## 2021-06-13 ENCOUNTER — Ambulatory Visit: Payer: Commercial Managed Care - PPO | Admitting: Anesthesiology

## 2021-06-13 ENCOUNTER — Ambulatory Visit
Admission: RE | Admit: 2021-06-13 | Discharge: 2021-06-13 | Disposition: A | Discharge status: Home / Self Care | Payer: Commercial Managed Care - PPO | Attending: Obstetrics and Gynecology | Admitting: Obstetrics and Gynecology

## 2021-06-13 ENCOUNTER — Other Ambulatory Visit: Payer: Self-pay

## 2021-06-13 DIAGNOSIS — K219 Gastro-esophageal reflux disease without esophagitis: Secondary | ICD-10-CM | POA: Diagnosis not present

## 2021-06-13 DIAGNOSIS — N8111 Cystocele, midline: Secondary | ICD-10-CM | POA: Insufficient documentation

## 2021-06-13 DIAGNOSIS — F419 Anxiety disorder, unspecified: Secondary | ICD-10-CM | POA: Diagnosis not present

## 2021-06-13 DIAGNOSIS — F32A Depression, unspecified: Secondary | ICD-10-CM | POA: Diagnosis not present

## 2021-06-13 DIAGNOSIS — D251 Intramural leiomyoma of uterus: Secondary | ICD-10-CM | POA: Diagnosis not present

## 2021-06-13 DIAGNOSIS — E785 Hyperlipidemia, unspecified: Secondary | ICD-10-CM | POA: Diagnosis not present

## 2021-06-13 DIAGNOSIS — N393 Stress incontinence (female) (male): Secondary | ICD-10-CM

## 2021-06-13 DIAGNOSIS — N8189 Other female genital prolapse: Secondary | ICD-10-CM | POA: Diagnosis not present

## 2021-06-13 HISTORY — PX: PERINEOPLASTY: SHX2218

## 2021-06-13 HISTORY — PX: PUBOVAGINAL SLING: SHX1035

## 2021-06-13 HISTORY — PX: LAPAROSCOPIC VAGINAL HYSTERECTOMY WITH SALPINGO OOPHORECTOMY: SHX6681

## 2021-06-13 HISTORY — PX: CYSTOCELE REPAIR: SHX163

## 2021-06-13 LAB — ABO/RH: ABO/RH(D): AB POS

## 2021-06-13 SURGERY — HYSTERECTOMY, VAGINAL, LAPAROSCOPY-ASSISTED, WITH SALPINGO-OOPHORECTOMY
Anesthesia: General

## 2021-06-13 MED ORDER — VASOPRESSIN 20 UNIT/ML IV SOLN
INTRAVENOUS | Status: DC | PRN
  Administered 2021-06-13: 20 mL via INTRAMUSCULAR

## 2021-06-13 MED ORDER — LACTATED RINGERS IV SOLN: INTRAVENOUS | Status: DC

## 2021-06-13 MED ORDER — FENTANYL CITRATE (PF) 100 MCG/2ML IJ SOLN
INTRAMUSCULAR | Status: AC
  Filled 2021-06-13: qty 2

## 2021-06-13 MED ORDER — PROPOFOL 10 MG/ML IV BOLUS
INTRAVENOUS | Status: DC | PRN
  Administered 2021-06-13: 120 mg via INTRAVENOUS

## 2021-06-13 MED ORDER — SODIUM CHLORIDE (PF) 0.9 % IJ SOLN
INTRAMUSCULAR | Status: AC
  Filled 2021-06-13: qty 50

## 2021-06-13 MED ORDER — LACTATED RINGERS IV SOLN
INTRAVENOUS | Status: DC
Start: 2021-06-13 — End: 2021-06-14

## 2021-06-13 MED ORDER — OXYCODONE-ACETAMINOPHEN 5-325 MG PO TABS: 1.0000 | ORAL_TABLET | ORAL | Status: DC | PRN

## 2021-06-13 MED ORDER — OXYCODONE-ACETAMINOPHEN 5-325 MG PO TABS: 1.0000 | ORAL_TABLET | ORAL | 0 refills | Status: DC | PRN

## 2021-06-13 MED ORDER — FAMOTIDINE 20 MG PO TABS
20.0000 mg | ORAL_TABLET | Freq: Once | ORAL | Status: AC
  Administered 2021-06-13: 20 mg via ORAL
  Filled 2021-06-13: qty 1

## 2021-06-13 MED ORDER — PROMETHAZINE HCL 25 MG/ML IJ SOLN: 6.2500 mg | INTRAMUSCULAR | Status: DC | PRN

## 2021-06-13 MED ORDER — HEMOSTATIC AGENTS (NO CHARGE) OPTIME
TOPICAL | Status: DC | PRN
  Administered 2021-06-13: 1 via TOPICAL

## 2021-06-13 MED ORDER — DEXTROSE IN LACTATED RINGERS 5 % IV SOLN: INTRAVENOUS | Status: DC

## 2021-06-13 MED ORDER — GABAPENTIN 300 MG PO CAPS
ORAL_CAPSULE | ORAL | Status: AC
  Administered 2021-06-13: 300 mg via ORAL
  Filled 2021-06-13: qty 1

## 2021-06-13 MED ORDER — EPHEDRINE 5 MG/ML INJ
INTRAVENOUS | Status: AC
Start: 2021-06-13 — End: ?
  Filled 2021-06-13: qty 5

## 2021-06-13 MED ORDER — ROCURONIUM BROMIDE 10 MG/ML (PF) SYRINGE
PREFILLED_SYRINGE | INTRAVENOUS | Status: AC
  Filled 2021-06-13: qty 10

## 2021-06-13 MED ORDER — DROPERIDOL 2.5 MG/ML IJ SOLN: 0.6250 mg | Freq: Once | INTRAMUSCULAR | Status: DC | PRN

## 2021-06-13 MED ORDER — 0.9 % SODIUM CHLORIDE (POUR BTL) OPTIME
TOPICAL | Status: DC | PRN
  Administered 2021-06-13: 500 mL

## 2021-06-13 MED ORDER — ONDANSETRON HCL 4 MG/2ML IJ SOLN
INTRAMUSCULAR | Status: AC
  Filled 2021-06-13: qty 2

## 2021-06-13 MED ORDER — MIDAZOLAM HCL 2 MG/2ML IJ SOLN
INTRAMUSCULAR | Status: AC
  Filled 2021-06-13: qty 2

## 2021-06-13 MED ORDER — CHLORHEXIDINE GLUCONATE 0.12 % MT SOLN
OROMUCOSAL | Status: AC
  Administered 2021-06-13: 15 mL via OROMUCOSAL
  Filled 2021-06-13: qty 15

## 2021-06-13 MED ORDER — CEFAZOLIN SODIUM-DEXTROSE 2-4 GM/100ML-% IV SOLN
INTRAVENOUS | Status: AC
  Filled 2021-06-13: qty 100

## 2021-06-13 MED ORDER — ESTROGENS CONJUGATED 0.625 MG/GM VA CREA
TOPICAL_CREAM | VAGINAL | Status: AC
  Filled 2021-06-13: qty 30

## 2021-06-13 MED ORDER — ONDANSETRON HCL 4 MG/2ML IJ SOLN: 4.0000 mg | Freq: Four times a day (QID) | INTRAMUSCULAR | Status: DC | PRN

## 2021-06-13 MED ORDER — CHLORHEXIDINE GLUCONATE 0.12 % MT SOLN: 15.0000 mL | Freq: Once | OROMUCOSAL | Status: AC

## 2021-06-13 MED ORDER — SUGAMMADEX SODIUM 200 MG/2ML IV SOLN
INTRAVENOUS | Status: DC | PRN
  Administered 2021-06-13 (×2): 147 mg via INTRAVENOUS

## 2021-06-13 MED ORDER — IBUPROFEN 600 MG PO TABS: 600.0000 mg | ORAL_TABLET | Freq: Four times a day (QID) | ORAL | 3 refills | Status: AC | PRN

## 2021-06-13 MED ORDER — KETOROLAC TROMETHAMINE 30 MG/ML IJ SOLN: 30.0000 mg | Freq: Four times a day (QID) | INTRAMUSCULAR | Status: DC

## 2021-06-13 MED ORDER — DEXAMETHASONE SODIUM PHOSPHATE 10 MG/ML IJ SOLN
INTRAMUSCULAR | Status: DC | PRN
Start: 2021-06-13 — End: 2021-06-13
  Administered 2021-06-13: 8 mg via INTRAVENOUS

## 2021-06-13 MED ORDER — ACETAMINOPHEN 500 MG PO TABS
1000.0000 mg | ORAL_TABLET | Freq: Once | ORAL | Status: AC
  Filled 2021-06-13: qty 1

## 2021-06-13 MED ORDER — OXYCODONE HCL 5 MG PO TABS
ORAL_TABLET | ORAL | Status: AC
  Filled 2021-06-13: qty 1

## 2021-06-13 MED ORDER — CELECOXIB 200 MG PO CAPS
400.0000 mg | ORAL_CAPSULE | ORAL | Status: AC
  Administered 2021-06-13: 400 mg via ORAL
  Filled 2021-06-13: qty 2

## 2021-06-13 MED ORDER — VASOPRESSIN 20 UNIT/ML IV SOLN
INTRAVENOUS | Status: AC
  Filled 2021-06-13: qty 1

## 2021-06-13 MED ORDER — BUPIVACAINE HCL 0.5 % IJ SOLN
INTRAMUSCULAR | Status: DC | PRN
  Administered 2021-06-13: 15 mL

## 2021-06-13 MED ORDER — ONDANSETRON HCL 4 MG/2ML IJ SOLN
INTRAMUSCULAR | Status: AC
  Administered 2021-06-13: 4 mg via INTRAVENOUS
  Filled 2021-06-13: qty 2

## 2021-06-13 MED ORDER — FENTANYL CITRATE (PF) 100 MCG/2ML IJ SOLN
INTRAMUSCULAR | Status: DC | PRN
  Administered 2021-06-13 (×2): 50 ug via INTRAVENOUS

## 2021-06-13 MED ORDER — SIMETHICONE 80 MG PO CHEW
80.0000 mg | CHEWABLE_TABLET | Freq: Four times a day (QID) | ORAL | Status: DC | PRN
  Filled 2021-06-13: qty 1

## 2021-06-13 MED ORDER — ONDANSETRON HCL 4 MG/2ML IJ SOLN
INTRAMUSCULAR | Status: DC | PRN
  Administered 2021-06-13: 4 mg via INTRAVENOUS

## 2021-06-13 MED ORDER — POVIDONE-IODINE 10 % EX SWAB: 2.0000 "application " | Freq: Once | CUTANEOUS | Status: DC

## 2021-06-13 MED ORDER — ONDANSETRON HCL 4 MG PO TABS: 4.0000 mg | ORAL_TABLET | Freq: Four times a day (QID) | ORAL | Status: DC | PRN

## 2021-06-13 MED ORDER — OXYCODONE HCL 5 MG/5ML PO SOLN: 5.0000 mg | Freq: Once | ORAL | Status: AC | PRN

## 2021-06-13 MED ORDER — GABAPENTIN 300 MG PO CAPS: 300.0000 mg | ORAL_CAPSULE | ORAL | Status: AC

## 2021-06-13 MED ORDER — LIDOCAINE-EPINEPHRINE 1 %-1:100000 IJ SOLN
INTRAMUSCULAR | Status: AC
  Filled 2021-06-13: qty 1

## 2021-06-13 MED ORDER — LIDOCAINE HCL (CARDIAC) PF 100 MG/5ML IV SOSY
PREFILLED_SYRINGE | INTRAVENOUS | Status: DC | PRN
Start: 2021-06-13 — End: 2021-06-13
  Administered 2021-06-13: 100 mg via INTRAVENOUS

## 2021-06-13 MED ORDER — ACETAMINOPHEN 10 MG/ML IV SOLN: 1000.0000 mg | Freq: Once | INTRAVENOUS | Status: DC | PRN

## 2021-06-13 MED ORDER — ACETAMINOPHEN 500 MG PO TABS
ORAL_TABLET | ORAL | Status: AC
  Administered 2021-06-13: 1000 mg via ORAL
  Filled 2021-06-13: qty 2

## 2021-06-13 MED ORDER — ROCURONIUM BROMIDE 100 MG/10ML IV SOLN
INTRAVENOUS | Status: DC | PRN
  Administered 2021-06-13: 60 mg via INTRAVENOUS

## 2021-06-13 MED ORDER — CEFAZOLIN SODIUM-DEXTROSE 2-4 GM/100ML-% IV SOLN
2.0000 g | INTRAVENOUS | Status: AC
  Administered 2021-06-13: 2 g via INTRAVENOUS

## 2021-06-13 MED ORDER — DEXAMETHASONE SODIUM PHOSPHATE 10 MG/ML IJ SOLN
INTRAMUSCULAR | Status: AC
  Filled 2021-06-13: qty 1

## 2021-06-13 MED ORDER — MIDAZOLAM HCL 2 MG/2ML IJ SOLN
INTRAMUSCULAR | Status: DC | PRN
  Administered 2021-06-13: 2 mg via INTRAVENOUS

## 2021-06-13 MED ORDER — FENTANYL CITRATE (PF) 100 MCG/2ML IJ SOLN
25.0000 ug | INTRAMUSCULAR | Status: DC | PRN
  Administered 2021-06-13 (×4): 25 ug via INTRAVENOUS

## 2021-06-13 MED ORDER — BUPIVACAINE HCL (PF) 0.5 % IJ SOLN
INTRAMUSCULAR | Status: AC
  Filled 2021-06-13: qty 30

## 2021-06-13 MED ORDER — EPHEDRINE SULFATE (PRESSORS) 50 MG/ML IJ SOLN
INTRAMUSCULAR | Status: DC | PRN
Start: 2021-06-13 — End: 2021-06-13
  Administered 2021-06-13 (×2): 5 mg via INTRAVENOUS

## 2021-06-13 MED ORDER — OXYCODONE HCL 5 MG PO TABS
5.0000 mg | ORAL_TABLET | Freq: Once | ORAL | Status: AC | PRN
  Administered 2021-06-13: 5 mg via ORAL

## 2021-06-13 MED ORDER — ORAL CARE MOUTH RINSE: 15.0000 mL | Freq: Once | OROMUCOSAL | Status: AC

## 2021-06-13 SURGICAL SUPPLY — 91 items
ADH LQ OCL WTPRF AMP STRL LF (MISCELLANEOUS) ×3
ADH SKN CLS APL DERMABOND .7 (GAUZE/BANDAGES/DRESSINGS) ×3
ADHESIVE MASTISOL STRL (MISCELLANEOUS) ×4 IMPLANT
APL PRP STRL LF DISP 70% ISPRP (MISCELLANEOUS) ×3
BACTOSHIELD CHG 4% 4OZ (MISCELLANEOUS) ×1
BAG DECANTER FOR FLEXI CONT (MISCELLANEOUS) ×4 IMPLANT
BAG DRN 6X6 URO DRBLE ADPR (MISCELLANEOUS) ×3
BAG DRN RND TRDRP ANRFLXCHMBR (UROLOGICAL SUPPLIES) ×3
BAG URINE DRAIN 2000ML AR STRL (UROLOGICAL SUPPLIES) ×4 IMPLANT
BAG URINE DRAIN UROCATCH STRL (MISCELLANEOUS) ×4 IMPLANT
BLADE SURG 10 STRL SS SAFETY (BLADE) ×4 IMPLANT
BLADE SURG 15 STRL LF DISP TIS (BLADE) ×3 IMPLANT
BLADE SURG 15 STRL SS (BLADE) ×8
BLADE SURG 15 STRL SS SAFETY (BLADE) ×4 IMPLANT
BLADE SURG SZ10 CARB STEEL (BLADE) ×4 IMPLANT
BLADE SURG SZ11 CARB STEEL (BLADE) ×4 IMPLANT
BNDG GAUZE ELAST 4 BULKY (GAUZE/BANDAGES/DRESSINGS) IMPLANT
CATH FOLEY 2WAY  5CC 16FR (CATHETERS) ×1
CATH FOLEY 2WAY 5CC 16FR (CATHETERS) ×3
CATH FOLEY 2WAY SIL 16X30 (CATHETERS) ×4 IMPLANT
CATH URTH 16FR FL 2W BLN LF (CATHETERS) ×3 IMPLANT
CHLORAPREP W/TINT 26 (MISCELLANEOUS) ×4 IMPLANT
CLEANER CAUTERY TIP 5X5 PAD (MISCELLANEOUS) IMPLANT
DERMABOND ADVANCED (GAUZE/BANDAGES/DRESSINGS) ×1
DERMABOND ADVANCED .7 DNX12 (GAUZE/BANDAGES/DRESSINGS) ×3 IMPLANT
DRAPE LAPAROTOMY 100X77 ABD (DRAPES) ×4 IMPLANT
DRAPE PERI LITHO V/GYN (MISCELLANEOUS) ×4 IMPLANT
DRAPE UNDER BUTTOCK W/FLU (DRAPES) ×4 IMPLANT
DRAPE UTILITY 15X26 TOWEL STRL (DRAPES) ×4 IMPLANT
ELECT CAUTERY BLADE 6.4 (BLADE) ×4 IMPLANT
ELECT REM PT RETURN 9FT ADLT (ELECTROSURGICAL) ×4
ELECTRODE REM PT RTRN 9FT ADLT (ELECTROSURGICAL) ×3 IMPLANT
GAUZE 4X4 16PLY ~~LOC~~+RFID DBL (SPONGE) ×16 IMPLANT
GAUZE PACK 2X3YD (PACKING) ×4 IMPLANT
GLOVE BIO SURGEON STRL SZ 6.5 (GLOVE) ×4 IMPLANT
GLOVE BIO SURGEON STRL SZ8 (GLOVE) ×4 IMPLANT
GLOVE BIOGEL PI ORTHO PRO 7.5 (GLOVE) ×2
GLOVE PI ORTHO PRO STRL 7.5 (GLOVE) ×6 IMPLANT
GLOVE SURG UNDER LTX SZ7 (GLOVE) ×4 IMPLANT
GOWN STRL REUS W/ TWL LRG LVL3 (GOWN DISPOSABLE) ×6 IMPLANT
GOWN STRL REUS W/ TWL XL LVL3 (GOWN DISPOSABLE) ×6 IMPLANT
GOWN STRL REUS W/TWL LRG LVL3 (GOWN DISPOSABLE) ×8
GOWN STRL REUS W/TWL XL LVL3 (GOWN DISPOSABLE) ×8
HANDLE YANKAUER SUCT BULB TIP (MISCELLANEOUS) IMPLANT
HEMOSTAT SURGICEL 2X3 (HEMOSTASIS) ×1 IMPLANT
IRRIGATION STRYKERFLOW (MISCELLANEOUS) IMPLANT
IRRIGATOR STRYKERFLOW (MISCELLANEOUS)
IV LACTATED RINGERS 1000ML (IV SOLUTION) ×4 IMPLANT
KIT PINK PAD W/HEAD ARE REST (MISCELLANEOUS) ×4
KIT PINK PAD W/HEAD ARM REST (MISCELLANEOUS) ×3 IMPLANT
KIT TURNOVER CYSTO (KITS) ×4 IMPLANT
LIGASURE LAP MARYLAND 5MM 37CM (ELECTROSURGICAL) ×1 IMPLANT
MANIFOLD NEPTUNE II (INSTRUMENTS) ×4 IMPLANT
NDL SAFETY ECLIPSE 18X1.5 (NEEDLE) ×3 IMPLANT
NDL SPNL 22GX3.5 QUINCKE BK (NEEDLE) ×3 IMPLANT
NEEDLE HYPO 18GX1.5 SHARP (NEEDLE) ×4
NEEDLE HYPO 22GX1.5 SAFETY (NEEDLE) ×4 IMPLANT
NEEDLE SPNL 22GX3.5 QUINCKE BK (NEEDLE) ×4 IMPLANT
NS IRRIG 500ML POUR BTL (IV SOLUTION) ×4 IMPLANT
PACK BASIN MINOR ARMC (MISCELLANEOUS) ×4 IMPLANT
PACK GYN LAPAROSCOPIC (MISCELLANEOUS) ×4 IMPLANT
PAD CLEANER CAUTERY TIP 5X5 (MISCELLANEOUS)
PAD OB MATERNITY 4.3X12.25 (PERSONAL CARE ITEMS) ×4 IMPLANT
PAD PREP 24X41 OB/GYN DISP (PERSONAL CARE ITEMS) ×4 IMPLANT
PENCIL SMOKE EVACUATOR (MISCELLANEOUS) ×4 IMPLANT
RETRACTOR PHONTONGUIDE ADAPT (ADAPTER) IMPLANT
SCRUB CHG 4% DYNA-HEX 4OZ (MISCELLANEOUS) ×3 IMPLANT
SET CYSTO W/LG BORE CLAMP LF (SET/KITS/TRAYS/PACK) IMPLANT
SLEEVE ENDOPATH XCEL 5M (ENDOMECHANICALS) ×8 IMPLANT
SLING TRANSOBTURATOR OBTRYX (Sling) ×4 IMPLANT
SOLUTION ELECTROLUBE (MISCELLANEOUS) ×1 IMPLANT
SPONGE T-LAP 18X18 ~~LOC~~+RFID (SPONGE) ×4 IMPLANT
STRAP SAFETY 5IN WIDE (MISCELLANEOUS) ×4 IMPLANT
STRIP CLOSURE SKIN 1/2X4 (GAUZE/BANDAGES/DRESSINGS) ×4 IMPLANT
SURGILUBE 2OZ TUBE FLIPTOP (MISCELLANEOUS) ×4 IMPLANT
SUT VIC AB 0 CT1 27 (SUTURE) ×8
SUT VIC AB 0 CT1 27XCR 8 STRN (SUTURE) ×6 IMPLANT
SUT VIC AB 0 CT1 36 (SUTURE) ×9 IMPLANT
SUT VIC AB 2-0 CT1 (SUTURE) ×8 IMPLANT
SUT VIC AB 4-0 FS2 27 (SUTURE) ×4 IMPLANT
SUT VICRYL 0 AB UR-6 (SUTURE) ×8 IMPLANT
SUT VICRYL+ 3-0 36IN CT-1 (SUTURE) ×4 IMPLANT
SYR 10ML LL (SYRINGE) ×8 IMPLANT
SYR CONTROL 10ML LL (SYRINGE) ×4 IMPLANT
SYR TOOMEY IRRIG 70ML (MISCELLANEOUS) ×4
SYRINGE TOOMEY IRRIG 70ML (MISCELLANEOUS) ×3 IMPLANT
TAPE TRANSPORE STRL 2 31045 (GAUZE/BANDAGES/DRESSINGS) ×4 IMPLANT
TOWEL OR 17X26 4PK STRL BLUE (TOWEL DISPOSABLE) ×4 IMPLANT
TROCAR XCEL NON-BLD 5MMX100MML (ENDOMECHANICALS) ×4 IMPLANT
TUBING EVAC SMOKE HEATED PNEUM (TUBING) ×4 IMPLANT
WATER STERILE IRR 500ML POUR (IV SOLUTION) ×4 IMPLANT

## 2021-06-13 NOTE — Anesthesia Procedure Notes (Signed)
Procedure Name: Intubation ?Date/Time: 06/13/2021 1:56 PM ?Performed by: Demetrius Charity, CRNA ?Pre-anesthesia Checklist: Patient identified, Patient being monitored, Timeout performed, Emergency Drugs available and Suction available ?Patient Re-evaluated:Patient Re-evaluated prior to induction ?Oxygen Delivery Method: Circle system utilized ?Preoxygenation: Pre-oxygenation with 100% oxygen ?Induction Type: IV induction ?Ventilation: Mask ventilation without difficulty ?Laryngoscope Size: 3 and McGraph ?Grade View: Grade I ?Tube type: Oral ?Tube size: 6.5 mm ?Number of attempts: 1 ?Airway Equipment and Method: Stylet ?Placement Confirmation: ETT inserted through vocal cords under direct vision, positive ETCO2 and breath sounds checked- equal and bilateral ?Secured at: 21 cm ?Tube secured with: Tape ?Dental Injury: Teeth and Oropharynx as per pre-operative assessment  ? ? ? ? ?

## 2021-06-13 NOTE — Op Note (Signed)
? ? ?  OPERATIVE NOTE ?06/13/2021 ?4:18 PM ? ?PRE-OPERATIVE DIAGNOSIS:  ?1) Pelvic Prolapse Symptoms, Stress Urinary incontinence ? ?POST-OPERATIVE DIAGNOSIS:  ?* No Diagnosis Codes entered * ? ?OPERATION: Procedure(s) (LRB): ?LAPAROSCOPIC ASSISTED VAGINAL HYSTERECTOMY WITH SALPINGO OOPHORECTOMY (Bilateral) ?ANTERIOR (CYSTOCELE) (Bilateral) ?PUBO-VAGINAL SLING (N/A) ?PERINEOPLASTY ?   ?SURGEON(S): Surgeon(s) and Role: ?   Harlin Heys, MD - Primary ?   Rubie Maid, MD   No other capable assistant was available for this surgery which requires an experienced, high level assistant.  ? ?ANESTHESIA: General ? ?ESTIMATED BLOOD LOSS: 182m ? ? ? ?SPECIMEN:  ?ID Type Source Tests Collected by Time Destination  ?1 : uterus, cervix, bilateral tube and ovaries Tissue PATH Gyn benign resection SURGICAL PATHOLOGY EHarlin Heys MD 06/13/2021 12703  ? ? ?COMPLICATIONS: None ? ?DRAINS: Foley to gravity ? ?DISPOSITION: Stable to recovery room ? ?DESCRIPTION OF PROCEDURE: ?     The patient was prepped and draped in the dorsolithotomy position and placed under general anesthesia. The bladder was emptied. The cervix was grasped with a multi-toothed tenaculum and a uterine manipulator was placed within the cervical os respecting the position and curvature of the uterus. ?After changing gloves we proceeded abdominally. A small infraumbilical incision was made and a 5 mm trocar port was placed within the abdominopelvic cavity. The opening pressure was less than 7 mmHg.  Approximately 3 and 1/2 L of carbon dioxide gas was instilled within the abdominal pelvic cavity. The laparoscope was placed and the pelvis and abdomen were carefully inspected. In the usual manner, under direct visualization right and left lower quadrant ports of 5 mm size were placed. Both ureters were identified in the pelvis prior to dissection or clamping and cutting of pedicles.  A window was made in the broad ligament and the infundibulopelvic  ligaments were carefully identified. The ligaments were clamped divided and doubly suture ligated. Hemostasis of the pedicles was noted. ?The fallopian tubes were elevated and the mesenteric side systematically coagulated and divided allowing the tube to be removed at the time of uterine removal. The round ligaments were coagulated and divided and a bladder flap was created. The upper aspect of the broad ligament was clamped coagulated and divided. The uterine arteries were skeletonized, triply coagulated and divided. Careful inspection of all pedicles and the remainder of the pelvis was performed. Hemostasis was noted. ?The lower quadrant ports were removed, hemostasis of the port sites was noted, and the incisions were closed in subcuticular manner. The laparoscope and trocar sleeve were removed from the infraumbilical incision, hemostasis was noted, and the incision was closed in a subcuticular manner. A long-acting anesthetic was employed in the skin incisions.  Dermabond was used. ?We then proceeded vaginally. ?A weighted speculum was placed posteriorly. A multi-toothed tenaculum was used to grasp the cervix and the cervix was injected in a circumferential manner with a dilute Pitressin solution. An incision was made around the cervix and the vaginal mucosa was dissected off of the cervix. The posterior cul-de-sac was identified and entered and the weighted speculum was placed within this. The anterior cul-de-sac was identified and entered and a retractor was placed and used to retract the bladder anteriorly keeping it out of the operative field. The uterosacral ligaments were clamped divided and suture ligated. The cardinal ligaments were clamped divided and suture ligated. The small remaining pedicle was clamped divided and suture ligated bilaterally allowing delivery of the specimen. ?Angle sutures were placed in the usual manner. A  culdoplasty was performed. The peritoneum was identified anteriorly and then  incorporating the left upper pedicle left lower pedicle right lower pedicle right upper pedicle and anterior peritoneum a pursestring suture was placed exteriorizing all pedicles. Hemostasis of all pedicles was noted at this time.  ?The vaginal mucosa beginning at the vaginal cuff and overlying the bladder, was grasped with Allis clamps and injected with a dilute Pitressin solution in the midline. A midline incision was made to the level of the urethra. The vaginal mucosa was dissected laterally from the underlying attenuated fascia. ?A Foley catheter was placed within the bladder and the bladder was emptied. Clear urine was noted. ?The obturator foramina were identified in the usual manner bilaterally and marked with a marking pen the skin and subcutaneous tissues were injected with a dilute Pitressin solution. Stab incisions were made and the TOT trochars were placed through these incisions onto the operator's finger in the vagina which was retracted and bladder medially. The vaginal tape was then placed on the trochars and reversed through these incisions. A Kelly clamp was placed under the tape and the sleeves of the tape were removed. The tape was noted to be correctly positioned underneath the urethra without twists. The excess tape was removed at the level of the skin. Steri-Strips were applied over these small skin incisions. ?A typical Kelly plication was performed carefully covering the tension-free vaginal tape with thickened fascia. The bladder was plicated several sutures of 3-0 Vicryl.  A shelf of fascia was then approximated in the midline placing the bladder back in its more anatomic position.The excess vaginal mucosa was trimmed. Vaginal mucosa was then closed in the midline with interrupted sutures to the level of the vaginal cuff. The vaginal cuff was closed with Vicryl suture. Hemostasis was noted. ?The posterior fourchette at approximately the hymenal ring was grasped using Allis clamps. A  midline incision was made and dissection was performed laterally bilaterally.  This allowed plication of the perineal body and the last 1" of vaginal mucosa. The perineal body was reinforced with multiple sutures of Vicryl. The mucosa was then closed over the perineal body in a subcuticular manner. Hemostasis was noted.  Clear urine was noted in the Foley.  ?Dr. Marcelline Mates provided exposure, dissection, suctioning, retraction, and general support and assistance during the procedure.  ? ?

## 2021-06-13 NOTE — Interval H&P Note (Signed)
History and Physical Interval Note: ? ?06/13/2021 ?1:44 PM ? ?Suzanne Scott  has presented today for surgery, with the diagnosis of Pelvic Prolapse Symptoms, Stress Urinary incontinence.  The various methods of treatment have been discussed with the patient and family. After consideration of risks, benefits and other options for treatment, the patient has consented to  Procedure(s): ?LAPAROSCOPIC ASSISTED VAGINAL HYSTERECTOMY WITH SALPINGO OOPHORECTOMY (Bilateral) ?ANTERIOR (CYSTOCELE) AND POSTERIOR REPAIR (RECTOCELE) TOT SLING (Bilateral) ?PUBO-VAGINAL SLING (N/A) as a surgical intervention.  The patient's history has been reviewed, patient examined, no change in status, stable for surgery.  I have reviewed the patient's chart and labs.  Questions were answered to the patient's satisfaction.   ? ? ?Jeannie Fend ? ? ?

## 2021-06-13 NOTE — Progress Notes (Signed)
Called Dr. Amalia Hailey to notify him that patient is ready to go home, and needs discharge orders. Performing self cath education at this time. Bladder scanned patient and scan resulted in 22 cc of urine. Per Dr. Amalia Hailey patient does not have to void prior to going home, just needs to demonstrate and perform self catheterization.  ?

## 2021-06-13 NOTE — Anesthesia Postprocedure Evaluation (Signed)
Anesthesia Post Note ? ?Patient: Suzanne Scott ? ?Procedure(s) Performed: LAPAROSCOPIC ASSISTED VAGINAL HYSTERECTOMY WITH SALPINGO OOPHORECTOMY (Bilateral) ?ANTERIOR (CYSTOCELE) (Bilateral) ?PUBO-VAGINAL SLING ?PERINEOPLASTY ? ?Patient location during evaluation: PACU ?Anesthesia Type: General ?Level of consciousness: awake and alert ?Pain management: pain level controlled ?Vital Signs Assessment: post-procedure vital signs reviewed and stable ?Respiratory status: spontaneous breathing, nonlabored ventilation, respiratory function stable and patient connected to nasal cannula oxygen ?Cardiovascular status: blood pressure returned to baseline and stable ?Postop Assessment: no apparent nausea or vomiting ?Anesthetic complications: no ? ? ?No notable events documented. ? ? ?Last Vitals:  ?Vitals:  ? 06/13/21 1744 06/13/21 1745  ?BP: 137/76 137/76  ?Pulse: 82 78  ?Resp: 16 17  ?Temp:  36.6 ?C  ?SpO2: 94% 95%  ?  ?Last Pain:  ?Vitals:  ? 06/13/21 1745  ?TempSrc:   ?PainSc: 5   ? ? ?  ?  ?  ?  ?  ?  ? ?Arita Miss ? ? ? ? ?

## 2021-06-13 NOTE — Transfer of Care (Signed)
Immediate Anesthesia Transfer of Care Note ? ?Patient: Suzanne Scott ? ?Procedure(s) Performed: Procedure(s): ?LAPAROSCOPIC ASSISTED VAGINAL HYSTERECTOMY WITH SALPINGO OOPHORECTOMY (Bilateral) ?ANTERIOR (CYSTOCELE) (Bilateral) ?PUBO-VAGINAL SLING (N/A) ?PERINEOPLASTY ? ?Patient Location: PACU ? ?Anesthesia Type:General ? ?Level of Consciousness: sedated ? ?Airway & Oxygen Therapy: Patient Spontanous Breathing and Patient connected to face mask oxygen ? ?Post-op Assessment: Report given to RN and Post -op Vital signs reviewed and stable ? ?Post vital signs: Reviewed and stable ? ?Last Vitals:  ?Vitals:  ? 06/13/21 0844 06/13/21 1635  ?BP: (!) 141/81 125/69  ?Pulse: 71   ?Resp: 16 17  ?Temp: 36.4 ?C 36.5 ?C  ?SpO2: 100% 99%  ? ? ?Complications: No apparent anesthesia complications ?

## 2021-06-13 NOTE — Progress Notes (Signed)
Spoke with Dr. Amalia Hailey to notify patient unable to self-cath. Per Dr. Amalia Hailey insert foley and discharge patient home with foley and follow up on Wednesday. Patient verbalized understanding.  ?

## 2021-06-13 NOTE — Progress Notes (Signed)
Removed foley in PACU according to a verbal order from Dr. Amalia Hailey. Provider wants nursing staff to educate patient how to self catheterize for at home self care. 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   ?

## 2021-06-13 NOTE — Discharge Instructions (Addendum)
AMBULATORY SURGERY  ?DISCHARGE INSTRUCTIONS ? ? ?The drugs that you were given will stay in your system until tomorrow so for the next 24 hours you should not: ? ?Drive an automobile ?Make any legal decisions ?Drink any alcoholic beverage ? ? ?You may resume regular meals tomorrow.  Today it is better to start with liquids and gradually work up to solid foods. ? ?You may eat anything you prefer, but it is better to start with liquids, then soup and crackers, and gradually work up to solid foods. ? ? ?Please notify your doctor immediately if you have any unusual bleeding, trouble breathing, redness and pain at the surgery site, drainage, fever, or pain not relieved by medication. ? ? ? ?Additional Instructions: ? ? ? ?Please contact your physician with any problems or Same Day Surgery at [phone], Monday through Friday 6 am to 4 pm, or [place] [facility] [place] [facility] number at [phone].  ?

## 2021-06-14 ENCOUNTER — Encounter: Payer: Self-pay | Admitting: Obstetrics and Gynecology

## 2021-06-15 LAB — SURGICAL PATHOLOGY

## 2021-06-16 ENCOUNTER — Encounter: Payer: Self-pay | Admitting: Obstetrics and Gynecology

## 2021-06-16 ENCOUNTER — Ambulatory Visit (INDEPENDENT_AMBULATORY_CARE_PROVIDER_SITE_OTHER): Payer: Commercial Managed Care - PPO | Admitting: Obstetrics and Gynecology

## 2021-06-16 VITALS — BP 153/82 | HR 61 | Ht 69.0 in | Wt 165.1 lb

## 2021-06-16 DIAGNOSIS — Z9889 Other specified postprocedural states: Secondary | ICD-10-CM

## 2021-06-16 DIAGNOSIS — R339 Retention of urine, unspecified: Secondary | ICD-10-CM

## 2021-06-16 NOTE — Progress Notes (Signed)
HPI:      [name] is a 61 y.o. [phone] who LMP was No LMP recorded (lmp unknown). Patient is postmenopausal.  Subjective:   She presents today 4 days from LAVH with bladder repair and sling procedure.  She has had a catheter in place.  She reports today to determine if she can urinate without significant residual. She reports that other than the first evening she had very little pain.  She is ambulating eating and drinking without difficulty.  She denies any problems. Urine in her catheter is clear.    Hx: The following portions of the patient's history were reviewed and updated as appropriate:             She  has a past medical history of Anxiety, Atrophic vaginitis, and Hyperlipidemia. She does not have any pertinent problems on file. She  has a past surgical history that includes Esophagogastroduodenoscopy (egd) with propofol (N/A, 10/15/2017); Colonoscopy with propofol (N/A, 10/15/2017); Breast biopsy (Left, 04/08/2015); Carpal tunnel release (Left, 10/28/2019); Cataract extraction w/PHACO (Right, 02/17/2020); Cataract extraction w/PHACO (Left, 03/10/2020); Laparoscopic vaginal hysterectomy with salpingo oophorectomy (Bilateral, 06/13/2021); Cystocele repair (Bilateral, 06/13/2021); Pubovaginal sling (N/A, 06/13/2021); and Perineoplasty (06/13/2021). Her family history includes Arthritis in her mother; Heart disease in her mother. She  reports that she has never smoked. She has never used smokeless tobacco. She reports that she does not currently use alcohol. She reports that she does not use drugs. She has a current medication list which includes the following prescription(s): atorvastatin, buspirone, citalopram, ibuprofen, and oxycodone-acetaminophen. She has No Known Allergies.       Review of Systems:  Review of Systems  Constitutional: Denied constitutional symptoms, night sweats, recent illness, fatigue, fever, insomnia and weight loss.  Eyes: Denied eye symptoms, eye pain,  photophobia, vision change and visual disturbance.  Ears/Nose/Throat/Neck: Denied ear, nose, throat or neck symptoms, hearing loss, nasal discharge, sinus congestion and sore throat.  Cardiovascular: Denied cardiovascular symptoms, arrhythmia, chest pain/pressure, edema, exercise intolerance, orthopnea and palpitations.  Respiratory: Denied pulmonary symptoms, asthma, pleuritic pain, productive sputum, cough, dyspnea and wheezing.  Gastrointestinal: Denied, gastro-esophageal reflux, melena, nausea and vomiting.  Genitourinary: Denied genitourinary symptoms including symptomatic vaginal discharge, pelvic relaxation issues, and urinary complaints.  Musculoskeletal: Denied musculoskeletal symptoms, stiffness, swelling, muscle weakness and myalgia.  Dermatologic: Denied dermatology symptoms, rash and scar.  Neurologic: Denied neurology symptoms, dizziness, headache, neck pain and syncope.  Psychiatric: Denied psychiatric symptoms, anxiety and depression.  Endocrine: Denied endocrine symptoms including hot flashes and night sweats.   Meds:   Current Outpatient Medications on File Prior to Visit  Medication Sig Dispense Refill   atorvastatin (LIPITOR) 10 MG tablet TAKE 1 TABLET(10 MG) BY MOUTH DAILY 90 tablet 4   busPIRone (BUSPAR) 10 MG tablet Take 1 tablet (10 mg total) by mouth 2 (two) times daily. 180 tablet 4   citalopram (CELEXA) 40 MG tablet Take 1 tablet (40 mg total) by mouth daily. 90 tablet 4   ibuprofen (ADVIL) 600 MG tablet Take 1 tablet (600 mg total) by mouth every 6 (six) hours as needed. 60 tablet 3   oxyCODONE-acetaminophen (PERCOCET/ROXICET) 5-325 MG tablet Take 1-2 tablets by mouth every 4 (four) hours as needed for moderate pain. 15 tablet 0   No current facility-administered medications on file prior to visit.      Objective:     Vitals:   06/16/21 1331  BP: (!) 153/82  Pulse: 61   Filed Weights   06/16/21 1331  Weight: 165 lb 1.6 oz (74.9 kg)               Catheter removed and patient encouraged to void.  Voiding amount =  50   In-N-Out cath performed  Residual = <10          Assessment:    [id number] Patient Active Problem List   Diagnosis Date Noted   Other female genital prolapse 04/13/2020   Elevated BP without diagnosis of hypertension 02/17/2019   Back pain 01/14/2019   Vitamin D deficiency 08/21/2018   Prediabetes 03/10/2018   Neuropathy 12/17/2017   Tendinitis, de Quervain's 07/31/2017   GERD (gastroesophageal reflux disease) 07/31/2017   Depression 02/09/2016   Allergic rhinitis 11/30/2014   Anxiety 07/16/2014   Atrophic vaginitis 07/16/2014   Hyperlipidemia 07/16/2014     1. Postoperative state   2. Urinary retention     Patient had very little urine in her bladder so probably not the best test for urinary retention today but she was able to void successfully in short time.   Plan:            1.  I was able to speak with her at some length regarding urinary retention.  I have encouraged her to avoid every 2 hours.  If she goes 4 hours without being able to void at all or if she becomes uncomfortable from a full bladder I have explained to her that this is a medical emergency and she must go to the ED.  She does not want the catheter replaced today and would prefer to continue to void on her own.  She understands the consequences of not being able to void.Marland Kitchen  She will inform us if she is not able to void successfully and we will catheterize her or have her go to the ED for catheterization.  Orders No orders of the defined types were placed in this encounter.   No orders of the defined types were placed in this encounter.     F/U  Return in about 4 weeks (around 07/14/2021).  Finis Bud, M.D. 06/16/2021 2:04 PM

## 2021-06-16 NOTE — Progress Notes (Signed)
Patient presents today post surgery for catheter removal. She states no pain, bleeding or post-op concerns.

## 2021-06-28 ENCOUNTER — Telehealth: Payer: Self-pay | Admitting: Obstetrics and Gynecology

## 2021-06-28 NOTE — Telephone Encounter (Signed)
Attempted to call pt to make her aware- left message with info

## 2021-06-28 NOTE — Telephone Encounter (Signed)
Spoke with patient regarding concerns of discharge. States orange colored discharge the past few days with slight odor. Patient states no complaints of pain, bleeding, incision issues or trouble urinating. Patient also denies any itching or burning. Please advise.

## 2021-06-28 NOTE — Telephone Encounter (Signed)
Pt called with some post-op concerns and was wanting to speak with clinical staff. Pt states no pain , no itching. Pt is having discharge (yellow/orange) with an odor. Please advise.

## 2021-06-28 NOTE — Telephone Encounter (Signed)
Pt called requesting update on fmla- states her work called stating that deadline is today. Please advise.

## 2021-06-29 ENCOUNTER — Other Ambulatory Visit
Admission: RE | Admit: 2021-06-29 | Discharge: 2021-06-29 | Disposition: A | Discharge status: Home / Self Care | Payer: Commercial Managed Care - PPO | Source: Ambulatory Visit | Attending: Ophthalmology | Admitting: Ophthalmology

## 2021-06-29 ENCOUNTER — Encounter: Payer: Commercial Managed Care - PPO | Admitting: Obstetrics and Gynecology

## 2021-06-29 DIAGNOSIS — H35353 Cystoid macular degeneration, bilateral: Secondary | ICD-10-CM | POA: Insufficient documentation

## 2021-06-29 DIAGNOSIS — H2013 Chronic iridocyclitis, bilateral: Secondary | ICD-10-CM | POA: Diagnosis not present

## 2021-06-30 LAB — ANGIOTENSIN CONVERTING ENZYME: Angiotensin-Converting Enzyme: 52 U/L (ref 14–82)

## 2021-06-30 LAB — RPR: RPR Ser Ql: NONREACTIVE

## 2021-07-06 ENCOUNTER — Telehealth: Payer: Self-pay | Admitting: Obstetrics and Gynecology

## 2021-07-06 NOTE — Telephone Encounter (Signed)
Attempted to call patient, LVM. Advised lightly bleeding and spotting in the first 6 weeks post surgery is normal and to monitor any future bleeding. Reminded patient of upcoming appointment and leave return phone number.

## 2021-07-06 NOTE — Telephone Encounter (Signed)
Pt called asking to speak with taylor, state she is a post-op patient and is starting to have a small amount of blood with odor, no fever or sick feeling- is wanting o know if these is concerning? Please advise.

## 2021-07-14 ENCOUNTER — Ambulatory Visit (INDEPENDENT_AMBULATORY_CARE_PROVIDER_SITE_OTHER): Payer: Commercial Managed Care - PPO | Admitting: Obstetrics and Gynecology

## 2021-07-14 ENCOUNTER — Encounter: Payer: Self-pay | Admitting: Obstetrics and Gynecology

## 2021-07-14 VITALS — BP 119/77 | HR 80 | Ht 69.0 in | Wt 163.4 lb

## 2021-07-14 DIAGNOSIS — Z9889 Other specified postprocedural states: Secondary | ICD-10-CM

## 2021-07-14 NOTE — Progress Notes (Signed)
HPI:      [name] is a 61 y.o. [phone] who LMP was No LMP recorded (lmp unknown). Patient is postmenopausal.  Subjective:   She presents today 6 weeks after LAVH and TOT.  She reports she is doing well.  She states that she still has some vaginal discharge with a small amount of bleeding.  She also says that occasionally at night when she wakes up to go to the bathroom she feels "moist" she is not sure if this is any different than before. She reports no pain.  She is voiding eating and having bowel movements without issue.    Hx: The following portions of the patient's history were reviewed and updated as appropriate:             She  has a past medical history of Anxiety, Atrophic vaginitis, and Hyperlipidemia. She does not have any pertinent problems on file. She  has a past surgical history that includes Esophagogastroduodenoscopy (egd) with propofol (N/A, 10/15/2017); Colonoscopy with propofol (N/A, 10/15/2017); Breast biopsy (Left, 04/08/2015); Carpal tunnel release (Left, 10/28/2019); Cataract extraction w/PHACO (Right, 02/17/2020); Cataract extraction w/PHACO (Left, 03/10/2020); Laparoscopic vaginal hysterectomy with salpingo oophorectomy (Bilateral, 06/13/2021); Cystocele repair (Bilateral, 06/13/2021); Pubovaginal sling (N/A, 06/13/2021); and Perineoplasty (06/13/2021). Her family history includes Arthritis in her mother; Heart disease in her mother. She  reports that she has never smoked. She has never used smokeless tobacco. She reports that she does not currently use alcohol. She reports that she does not use drugs. She has a current medication list which includes the following prescription(s): atorvastatin, buspirone, citalopram, and ibuprofen. She has No Known Allergies.       Review of Systems:  Review of Systems  Constitutional: Denied constitutional symptoms, night sweats, recent illness, fatigue, fever, insomnia and weight loss.  Eyes: Denied eye symptoms, eye pain,  photophobia, vision change and visual disturbance.  Ears/Nose/Throat/Neck: Denied ear, nose, throat or neck symptoms, hearing loss, nasal discharge, sinus congestion and sore throat.  Cardiovascular: Denied cardiovascular symptoms, arrhythmia, chest pain/pressure, edema, exercise intolerance, orthopnea and palpitations.  Respiratory: Denied pulmonary symptoms, asthma, pleuritic pain, productive sputum, cough, dyspnea and wheezing.  Gastrointestinal: Denied, gastro-esophageal reflux, melena, nausea and vomiting.  Genitourinary: See HPI for additional information.  Musculoskeletal: Denied musculoskeletal symptoms, stiffness, swelling, muscle weakness and myalgia.  Dermatologic: Denied dermatology symptoms, rash and scar.  Neurologic: Denied neurology symptoms, dizziness, headache, neck pain and syncope.  Psychiatric: Denied psychiatric symptoms, anxiety and depression.  Endocrine: Denied endocrine symptoms including hot flashes and night sweats.   Meds:   Current Outpatient Medications on File Prior to Visit  Medication Sig Dispense Refill   atorvastatin (LIPITOR) 10 MG tablet TAKE 1 TABLET(10 MG) BY MOUTH DAILY 90 tablet 4   busPIRone (BUSPAR) 10 MG tablet Take 1 tablet (10 mg total) by mouth 2 (two) times daily. 180 tablet 4   citalopram (CELEXA) 40 MG tablet Take 1 tablet (40 mg total) by mouth daily. 90 tablet 4   ibuprofen (ADVIL) 600 MG tablet Take 1 tablet (600 mg total) by mouth every 6 (six) hours as needed. 60 tablet 3   No current facility-administered medications on file prior to visit.      Objective:     Vitals:   07/14/21 0846  BP: 119/77  Pulse: 80   Filed Weights   07/14/21 0846  Weight: 163 lb 6.4 oz (74.1 kg)     Abdomen: Soft.  Non-tender.  No masses.  No HSM.  Incision/s:  Intact.  Healing well.  No erythema.  No drainage.    Pelvic:   Vulva: Normal appearance.  No lesions.  Vagina: No lesions or abnormalities noted.  Support: Normal pelvic support.   Urethra No masses tenderness or scarring.  Meatus Normal size without lesions or prolapse  Vag Cuff: Intact.  No lesions.  Small amount of granulation tissue noted cauterized with silver nitrate.  Anus: Normal exam.  No lesions.  Perineum: Normal exam.  No lesions.        Bimanual   Adnexae: No masses.  Non-tender to palpation.  Cuff: Negative for abnormality.                       Assessment:    [id number] Patient Active Problem List   Diagnosis Date Noted   Other female genital prolapse 04/13/2020   Elevated BP without diagnosis of hypertension 02/17/2019   Back pain 01/14/2019   Vitamin D deficiency 08/21/2018   Prediabetes 03/10/2018   Neuropathy 12/17/2017   Tendinitis, de Quervain's 07/31/2017   GERD (gastroesophageal reflux disease) 07/31/2017   Depression 02/09/2016   Allergic rhinitis 11/30/2014   Anxiety 07/16/2014   Atrophic vaginitis 07/16/2014   Hyperlipidemia 07/16/2014     1. Postoperative state     Patient with excellent recovery postop.  Doing very well.   Plan:            1.  Patient may resume normal activities with exception of heavy lifting.  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  No lifting greater than 30 pounds.   Orders No orders of the defined types were placed in this encounter.   No orders of the defined types were placed in this encounter.     F/U  No follow-ups on file.  Finis Bud, M.D. 07/14/2021 9:05 AM

## 2021-07-14 NOTE — Progress Notes (Signed)
Patient presents today for a 6 week follow-up. She states she is still lightly bleeding, using a liner at this time. Patient denies pain or discomfort. No other questions or concerns at this time

## 2021-09-04 NOTE — Patient Instructions (Addendum)
I WILL MISS YOUR FAMILY AND YOU!!  GOOD LUCK ON YOUR MOVE!!  [place] IS LUCK[place] TO HAVE YOU ALL!!  LOVE YOU ALL!!  DASH Eating Plan DASH stands for Dietary Approaches to Stop Hypertension. The DASH eating plan is a healthy eating plan that has been shown to: Reduce high blood pressure (hypertension). Reduce your risk for type 2 diabetes, heart disease, and stroke. Help with weight loss. What are tips for following this plan? Reading food labels Check food labels for the amount of salt (sodium) per serving. Choose foods with less than 5 percent of the Daily Value of sodium. Generally, foods with less than 300 milligrams (mg) of sodium per serving fit into this eating plan. To find whole grains, look for the word "whole" as the first word in the ingredient list. Shopping Buy products labeled as "low-sodium" or "no salt added." Buy fresh foods. Avoid canned foods and pre-made or frozen meals. Cooking Avoid adding salt when cooking. Use salt-free seasonings or herbs instead of table salt or sea salt. Check with your health care provider or pharmacist before using salt substitutes. Do not fry foods. Cook foods using healthy methods such as baking, boiling, grilling, roasting, and broiling instead. Cook with heart-healthy oils, such as olive, canola, avocado, soybean, or sunflower oil. Meal planning  Eat a balanced diet that includes: 4 or more servings of fruits and 4 or more servings of vegetables each day. Try to fill one-half of your plate with fruits and vegetables. 6-8 servings of whole grains each day. Less than 6 oz (170 g) of lean meat, poultry, or fish each day. A 3-oz (85-g) serving of meat is about the same size as a deck of cards. One egg equals 1 oz (28 g). 2-3 servings of low-fat dairy each day. One serving is 1 cup (237 mL). 1 serving of nuts, seeds, or beans 5 times each week. 2-3 servings of heart-healthy fats. Healthy fats called omega-3 fatty acids are found in foods such as  walnuts, flaxseeds, fortified milks, and eggs. These fats are also found in cold-water fish, such as sardines, salmon, and mackerel. Limit how much you eat of: Canned or prepackaged foods. Food that is high in trans fat, such as some fried foods. Food that is high in saturated fat, such as fatty meat. Desserts and other sweets, sugary drinks, and other foods with added sugar. Full-fat dairy products. Do not salt foods before eating. Do not eat more than 4 egg yolks a week. Try to eat at least 2 vegetarian meals a week. Eat more home-cooked food and less restaurant, buffet, and fast food. Lifestyle When eating at a restaurant, ask that your food be prepared with less salt or no salt, if possible. If you drink alcohol: Limit how much you use to: 0-1 drink a day for women who are not pregnant. 0-2 drinks a day for men. Be aware of how much alcohol is in your drink. In the [place] one drink equals one 12 oz bottle of beer (355 mL), one 5 oz glass of wine (148 mL), or one 1 oz glass of hard liquor (44 mL). General information Avoid eating more than 2,300 mg of salt a day. If you have hypertension, you may need to reduce your sodium intake to 1,500 mg a day. Work with your health care provider to maintain a healthy body weight or to lose weight. Ask what an ideal weight is for you. Get at least 30 minutes of exercise that causes your heart  to beat faster (aerobic exercise) most days of the week. Activities may include walking, swimming, or biking. Work with your health care provider or dietitian to adjust your eating plan to your individual calorie needs. What foods should I eat? Fruits All fresh, dried, or frozen fruit. Canned fruit in natural juice (without added sugar). Vegetables Fresh or frozen vegetables (raw, steamed, roasted, or grilled). Low-sodium or reduced-sodium tomato and vegetable juice. Low-sodium or reduced-sodium tomato sauce and tomato paste. Low-sodium or reduced-sodium canned  vegetables. Grains Whole-grain or whole-wheat bread. Whole-grain or whole-wheat pasta. Brown rice. [place]. Bulgur. Whole-grain and low-sodium cereals. Pita bread. Low-fat, low-sodium crackers. Whole-wheat flour tortillas. Meats and other proteins Skinless chicken or [place]. Ground chicken or [place]. Pork with fat trimmed off. Fish and seafood. Egg whites. Dried beans, peas, or lentils. Unsalted nuts, nut butters, and seeds. Unsalted canned beans. Lean cuts of beef with fat trimmed off. Low-sodium, lean precooked or cured meat, such as sausages or meat loaves. Dairy Low-fat (1%) or fat-free (skim) milk. Reduced-fat, low-fat, or fat-free cheeses. Nonfat, low-sodium ricotta or cottage cheese. Low-fat or nonfat yogurt. Low-fat, low-sodium cheese. Fats and oils Soft margarine without trans fats. Vegetable oil. Reduced-fat, low-fat, or light mayonnaise and salad dressings (reduced-sodium). Canola, safflower, olive, avocado, soybean, and sunflower oils. Avocado. Seasonings and condiments Herbs. Spices. Seasoning mixes without salt. Other foods Unsalted popcorn and pretzels. Fat-free sweets. The items listed above may not be a complete list of foods and beverages you can eat. Contact a dietitian for more information. What foods should I avoid? Fruits Canned fruit in a light or heavy syrup. Fried fruit. Fruit in cream or butter sauce. Vegetables Creamed or fried vegetables. Vegetables in a cheese sauce. Regular canned vegetables (not low-sodium or reduced-sodium). Regular canned tomato sauce and paste (not low-sodium or reduced-sodium). Regular tomato and vegetable juice (not low-sodium or reduced-sodium). Angie Fava. Olives. Grains Baked goods made with fat, such as croissants, muffins, or some breads. Dry pasta or rice meal packs. Meats and other proteins Fatty cuts of meat. Ribs. Fried meat. Berniece Salines. Bologna, salami, and other precooked or cured meats, such as sausages or meat loaves. Fat from  the back of a pig (fatback). Bratwurst. Salted nuts and seeds. Canned beans with added salt. Canned or smoked fish. Whole eggs or egg yolks. Chicken or [place] with skin. Dairy Whole or 2% milk, cream, and half-and-half. Whole or full-fat cream cheese. Whole-fat or sweetened yogurt. Full-fat cheese. Nondairy creamers. Whipped toppings. Processed cheese and cheese spreads. Fats and oils Butter. Stick margarine. Lard. Shortening. Ghee. Bacon fat. Tropical oils, such as coconut, palm kernel, or palm oil. Seasonings and condiments Onion salt, garlic salt, seasoned salt, table salt, and sea salt. Worcestershire sauce. Tartar sauce. Barbecue sauce. Teriyaki sauce. Soy sauce, including reduced-sodium. Steak sauce. Canned and packaged gravies. Fish sauce. Oyster sauce. Cocktail sauce. Store-bought horseradish. Ketchup. Mustard. Meat flavorings and tenderizers. Bouillon cubes. Hot sauces. Pre-made or packaged marinades. Pre-made or packaged taco seasonings. Relishes. Regular salad dressings. Other foods Salted popcorn and pretzels. The items listed above may not be a complete list of foods and beverages you should avoid. Contact a dietitian for more information. Where to find more information National Heart, Lung, and Blood Institute: https://wilson-eaton.com/ American Heart Association: www.heart.org Academy of Nutrition and Dietetics: www.eatright.[place]: www.kidney.org Summary The DASH eating plan is a healthy eating plan that has been shown to reduce high blood pressure (hypertension). It may also reduce your risk for type 2 diabetes, heart disease, and stroke.  When on the DASH eating plan, aim to eat more fresh fruits and vegetables, whole grains, lean proteins, low-fat dairy, and heart-healthy fats. With the DASH eating plan, you should limit salt (sodium) intake to 2,300 mg a day. If you have hypertension, you may need to reduce your sodium intake to 1,500 mg a day. Work with your  health care provider or dietitian to adjust your eating plan to your individual calorie needs. This information is not intended to replace advice given to you by your health care provider. Make sure you discuss any questions you have with your health care provider. Document Revised: 12/20/2018 Document Reviewed: 12/20/2018 Elsevier Patient Education  [place].

## 2021-09-08 ENCOUNTER — Encounter: Payer: Self-pay | Admitting: Nurse Practitioner

## 2021-09-08 ENCOUNTER — Ambulatory Visit (INDEPENDENT_AMBULATORY_CARE_PROVIDER_SITE_OTHER): Payer: Commercial Managed Care - PPO | Admitting: Nurse Practitioner

## 2021-09-08 VITALS — BP 127/78 | HR 66 | Temp 98.5°F | Ht 69.0 in | Wt 162.0 lb

## 2021-09-08 DIAGNOSIS — R030 Elevated blood-pressure reading, without diagnosis of hypertension: Secondary | ICD-10-CM

## 2021-09-08 DIAGNOSIS — F419 Anxiety disorder, unspecified: Secondary | ICD-10-CM

## 2021-09-08 DIAGNOSIS — R7303 Prediabetes: Secondary | ICD-10-CM

## 2021-09-08 DIAGNOSIS — R768 Other specified abnormal immunological findings in serum: Secondary | ICD-10-CM

## 2021-09-08 DIAGNOSIS — H3500 Unspecified background retinopathy: Secondary | ICD-10-CM | POA: Insufficient documentation

## 2021-09-08 DIAGNOSIS — E782 Mixed hyperlipidemia: Secondary | ICD-10-CM | POA: Diagnosis not present

## 2021-09-08 DIAGNOSIS — F324 Major depressive disorder, single episode, in partial remission: Secondary | ICD-10-CM | POA: Diagnosis not present

## 2021-09-08 LAB — BAYER DCA HB A1C WAIVED: HB A1C (BAYER DCA - WAIVED): 6.1 % — ABNORMAL HIGH (ref 4.8–5.6)

## 2021-09-08 NOTE — Assessment & Plan Note (Signed)
Followed by rheumatology, has not seen since 2021.  Is moving to [place], will place referral to rheumatology there to continue care.

## 2021-09-08 NOTE — Progress Notes (Signed)
BP 127/78   Pulse 66   Temp 98.5 F (36.9 C) (Oral)   Ht '5\' 9"'$  (1.753 m)   Wt 162 lb (73.5 kg)   LMP  (LMP Unknown)   SpO2 96%   BMI 23.92 kg/m    Subjective:    Patient ID: Suzanne Scott, female    DOB: 03-Nov-1960, 61 y.o.   MRN: [id number]  HPI: Suzanne Scott is a 61 y.o. female  Chief Complaint  Patient presents with   Hypertension   Depression   Anxiety   Prediabetes   Needs referral to rheumatologist in [place] due to retinopathy and Anti-RNP antibodies, followed locally by specialists.  HYPERTENSION / HYPERLIPIDEMIA No current medications for HTN, Atorvastatin for HLD.  Last A1c in April 5.7% -- remains in prediabetic range.  Has been focusing on diet changes + drinking more water. Satisfied with current treatment? yes Duration of hypertension: chronic BP monitoring frequency: not checking BP range:  Duration of hyperlipidemia: chronic Cholesterol medication side effects: yes Cholesterol supplements: none Medication compliance: good compliance Aspirin: no Recent stressors: no Recurrent headaches: no Visual changes: no Palpitations: no Dyspnea: no Chest pain: no Lower extremity edema: no Dizzy/lightheaded: no  ANXIETY/STRESS Continues on Celexa 40 MG daily + Buspar 10 MG BID. She is preparing to move to [place] with her daughters, some increased stressors with this. Duration: stable Anxious mood: occasional Excessive worrying: occasional Irritability: no  Sweating: no Nausea: no Palpitations: no Hyperventilation: no Panic attacks: no Agoraphobia: no  Obscessions/compulsions: no Depressed mood: occasional with moving    09/08/2021    8:16 AM 05/20/2021    3:32 PM 04/14/2021   10:25 AM 11/16/2020    3:54 PM 09/17/2020    4:00 PM  Depression screen PHQ 2/9  Decreased Interest 0 0 3 0 0  Down, Depressed, Hopeless 0 0 2 0 0  PHQ - 2 Score 0 0 5 0 0  Altered sleeping 2 0 3 0 0  Tired, decreased energy 1 0 3 0 0  Change in appetite 0 0 3  0 0  Feeling bad or failure about yourself  0 0 0 0 0  Trouble concentrating '1 2 3 2 1  '$ Moving slowly or fidgety/restless 0 0 2 0 0  Suicidal thoughts 0 0 1 0 0  PHQ-9 Score '4 2 20 2 1  '$ Difficult doing work/chores Not difficult at all   Not difficult at all Not difficult at all       09/08/2021    8:16 AM 05/20/2021    3:32 PM 04/14/2021   10:25 AM 11/16/2020    3:57 PM  GAD 7 : Generalized Anxiety Score  Nervous, Anxious, on Edge '1 1 3 1  '$ Control/stop worrying 0 0 3 0  Worry too much - different things '1 1 3 1  '$ Trouble relaxing '1 1 3 1  '$ Restless 0 1 2 0  Easily annoyed or irritable '1 1 3 '$ 0  Afraid - awful might happen 0 2  1  Total GAD 7 Score '4 7  4  '$ Anxiety Difficulty Somewhat difficult  Somewhat difficult Not difficult at all    Relevant past medical, surgical, family and social history reviewed and updated as indicated. Interim medical history since our last visit reviewed. Allergies and medications reviewed and updated.  Review of Systems  Constitutional:  Negative for activity change, appetite change, diaphoresis, fatigue and fever.  Respiratory:  Negative for cough, chest tightness and shortness of breath.  Cardiovascular:  Negative for chest pain, palpitations and leg swelling.  Gastrointestinal: Negative.   Neurological: Negative.   Psychiatric/Behavioral:  Negative for confusion, self-injury, sleep disturbance and suicidal ideas. The patient is not nervous/anxious.     Per HPI unless specifically indicated above     Objective:    BP 127/78   Pulse 66   Temp 98.5 F (36.9 C) (Oral)   Ht '5\' 9"'$  (1.753 m)   Wt 162 lb (73.5 kg)   LMP  (LMP Unknown)   SpO2 96%   BMI 23.92 kg/m   Wt Readings from Last 3 Encounters:  09/08/21 162 lb (73.5 kg)  07/14/21 163 lb 6.4 oz (74.1 kg)  06/16/21 165 lb 1.6 oz (74.9 kg)    Physical Exam Vitals and nursing note reviewed.  Constitutional:      General: She is awake. She is not in acute distress.    Appearance: She is  well-developed. She is not ill-appearing.  HENT:     Head: Normocephalic.     Right Ear: Hearing and external ear normal. No drainage.     Left Ear: Hearing and external ear normal. No drainage.  Eyes:     General: Lids are normal.        Right eye: No discharge.        Left eye: No discharge.     Conjunctiva/sclera: Conjunctivae normal.     Pupils: Pupils are equal, round, and reactive to light.  Neck:     Thyroid: No thyromegaly.     Vascular: No carotid bruit or JVD.  Cardiovascular:     Rate and Rhythm: Normal rate and regular rhythm.     Heart sounds: Normal heart sounds. No murmur heard.    No gallop.  Pulmonary:     Effort: Pulmonary effort is normal. No accessory muscle usage or respiratory distress.     Breath sounds: Normal breath sounds.  Abdominal:     General: Bowel sounds are normal.     Palpations: Abdomen is soft.  Musculoskeletal:     Cervical back: Normal range of motion and neck supple.     Right lower leg: No edema.     Left lower leg: No edema.  Lymphadenopathy:     Cervical: No cervical adenopathy.  Skin:    General: Skin is warm and dry.  Neurological:     Mental Status: She is alert and oriented to person, place, and time.  Psychiatric:        Attention and Perception: Attention normal.        Mood and Affect: Mood normal.        Speech: Speech normal.        Behavior: Behavior normal. Behavior is cooperative.    Results for orders placed or performed during the hospital encounter of 06/29/21  Angiotensin converting enzyme  Result Value Ref Range   Angiotensin-Converting Enzyme 52 14 - 82 U/L  RPR  Result Value Ref Range   RPR Ser Ql NON REACTIVE NON REACTIVE      Assessment & Plan:   Problem List Items Addressed This Visit       Other   Anti-RNP antibodies present    Followed by rheumatology, has not seen since 2021.  Is moving to [place], will place referral to rheumatology there to continue care.      Relevant Orders    Ambulatory referral to Rheumatology   Anxiety    Refer to depression plan of care.  Depression - Primary    Chronic, stable.  Denies SI/H.  Continue Celexa 40 MG daily at this time, but made her aware at 56 will need to reduce to 20 MG due to QT prolongation risk.  Continue Buspar 10 MG BID at this time.  Could consider direct switch in future to Sertraline at higher dose, but for now she has done well with Celexa.        Elevated BP without diagnosis of hypertension    Ongoing with BP at goal in office today.  No current medications.  Continue focus on DASH diet and regular exercise.  Recommend she monitor BP at home daily and will continue collaboration with cardiology.  Initiate medication as needed -- would consider Losartan due to urine ALB 30 past check.  CMP today.      Relevant Orders   Comprehensive metabolic panel   Hyperlipidemia    Chronic, stable.  We will continue Atorvastatin 10 mg daily and adjust as needed.  Lipid check today.       Relevant Orders   Comprehensive metabolic panel   Lipid Panel w/o Chol/HDL Ratio   Prediabetes    A1c trend up to 6.1% today, recommend continue diet changes and regular activity.  Continue to monitor.      Relevant Orders   Bayer DCA Hb A1c Waived   Comprehensive metabolic panel   Retinopathy    Followed by eye specialist and rheumatology, transferring care to [place] as is moving.        Follow up plan: Return if symptoms worsen or fail to improve.

## 2021-09-08 NOTE — Assessment & Plan Note (Signed)
Refer to depression plan of care. 

## 2021-09-08 NOTE — Assessment & Plan Note (Signed)
Chronic, stable.  Denies SI/H.  Continue Celexa 40 MG daily at this time, but made her aware at 56 will need to reduce to 20 MG due to QT prolongation risk.  Continue Buspar 10 MG BID at this time.  Could consider direct switch in future to Sertraline at higher dose, but for now she has done well with Celexa.

## 2021-09-08 NOTE — Assessment & Plan Note (Signed)
Ongoing with BP at goal in office today.  No current medications.  Continue focus on DASH diet and regular exercise.  Recommend she monitor BP at home daily and will continue collaboration with cardiology.  Initiate medication as needed -- would consider Losartan due to urine ALB 30 past check.  CMP today.

## 2021-09-08 NOTE — Assessment & Plan Note (Signed)
Chronic, stable.  We will continue Atorvastatin 10 mg daily and adjust as needed.  Lipid check today.

## 2021-09-08 NOTE — Assessment & Plan Note (Signed)
Followed by eye specialist and rheumatology, transferring care to [place] as is moving.

## 2021-09-08 NOTE — Assessment & Plan Note (Addendum)
A1c trend up to 6.1% today, recommend continue diet changes and regular activity.  Continue to monitor.

## 2021-09-09 LAB — LIPID PANEL W/O CHOL/HDL RATIO
Cholesterol, Total: 165 mg/dL (ref 100–199)
HDL: 46 mg/dL (ref >39)
LDL Chol Calc (NIH): 105 mg/dL — ABNORMAL HIGH (ref 0–99)
Triglycerides: 72 mg/dL (ref 0–149)
VLDL Cholesterol Cal: 14 mg/dL (ref 5–40)

## 2021-09-09 LAB — COMPREHENSIVE METABOLIC PANEL
ALT: 14 IU/L (ref 0–32)
AST: 18 IU/L (ref 0–40)
Albumin/Globulin Ratio: 1.7 (ref 1.2–2.2)
Albumin: 4.5 g/dL (ref 3.9–4.9)
Alkaline Phosphatase: 71 IU/L (ref 44–121)
BUN/Creatinine Ratio: 13 (ref 12–28)
BUN: 12 mg/dL (ref 8–27)
Bilirubin Total: 0.4 mg/dL (ref 0.0–1.2)
CO2: 24 mmol/L (ref 20–29)
Calcium: 9.3 mg/dL (ref 8.7–10.3)
Chloride: 100 mmol/L (ref 96–106)
Creatinine, Ser: 0.92 mg/dL (ref 0.57–1.00)
Globulin, Total: 2.7 g/dL (ref 1.5–4.5)
Glucose: 100 mg/dL — ABNORMAL HIGH (ref 70–99)
Potassium: 4.2 mmol/L (ref 3.5–5.2)
Sodium: 139 mmol/L (ref 134–144)
Total Protein: 7.2 g/dL (ref 6.0–8.5)
eGFR: 70 mL/min/{1.73_m2} (ref >59)

## 2021-09-09 NOTE — Progress Notes (Signed)
Contacted via [place] [facility], your labs have returned: - Kidney function, creatinine and eGFR, remains normal, as is liver function, AST and ALT.   - LDL a little elevated on cholesterol, I recommend ensure you continue Atorvastatin daily and when you establish in [place] go for fasting labs to recheck this.  Any questions?  Love you all!!  Good luck on your adventures!! Keep being amazing!!  Thank you for allowing me to participate in your care.  I appreciate you. Kindest regards, Karolyna Bianchini

## 2021-09-14 ENCOUNTER — Telehealth: Payer: Self-pay | Admitting: Nurse Practitioner

## 2021-09-14 DIAGNOSIS — H3500 Unspecified background retinopathy: Secondary | ICD-10-CM

## 2021-09-14 NOTE — Telephone Encounter (Signed)
Copied from [place] [phone]. Topic: Referral - Request for Referral >> Sep 14, 2021  1:44 PM Tiffany B wrote: Patient currently at specialist office and would like Dr. Noelle Penner phone # [phone] fax [phone]

## 2021-09-20 NOTE — Addendum Note (Signed)
Addended by: Marnee Guarneri T on: 09/20/2021 08:04 AM   Modules accepted: Orders

## 2021-10-19 ENCOUNTER — Encounter: Payer: Commercial Managed Care - PPO | Admitting: Obstetrics and Gynecology

## 2021-11-21 ENCOUNTER — Ambulatory Visit: Payer: Commercial Managed Care - PPO | Admitting: Nurse Practitioner

## 2022-05-16 ENCOUNTER — Telehealth: Payer: Self-pay

## 2022-05-16 NOTE — Telephone Encounter (Signed)
Pt calling; would like to speak with DJE's nurse.  [phone]

## 2022-05-16 NOTE — Telephone Encounter (Signed)
Spoke with patient, concerns addressed.
